# Patient Record
Sex: Female | Born: 1945 | Race: White | Hispanic: No | Smoking: Never smoker
Health system: Southern US, Community
[De-identification: ages and names within clinical notes are randomized; demographics above are authoritative.]

## PROBLEM LIST (undated history)

## (undated) DIAGNOSIS — R0600 Dyspnea, unspecified: Secondary | ICD-10-CM

## (undated) DIAGNOSIS — K922 Gastrointestinal hemorrhage, unspecified: Secondary | ICD-10-CM

## (undated) DIAGNOSIS — A499 Bacterial infection, unspecified: Secondary | ICD-10-CM

## (undated) DIAGNOSIS — M1990 Unspecified osteoarthritis, unspecified site: Secondary | ICD-10-CM

## (undated) DIAGNOSIS — N390 Urinary tract infection, site not specified: Secondary | ICD-10-CM

## (undated) DIAGNOSIS — C801 Malignant (primary) neoplasm, unspecified: Secondary | ICD-10-CM

## (undated) DIAGNOSIS — F419 Anxiety disorder, unspecified: Secondary | ICD-10-CM

## (undated) DIAGNOSIS — G4730 Sleep apnea, unspecified: Secondary | ICD-10-CM

## (undated) DIAGNOSIS — K649 Unspecified hemorrhoids: Secondary | ICD-10-CM

## (undated) DIAGNOSIS — D649 Anemia, unspecified: Secondary | ICD-10-CM

## (undated) DIAGNOSIS — K219 Gastro-esophageal reflux disease without esophagitis: Secondary | ICD-10-CM

## (undated) DIAGNOSIS — J189 Pneumonia, unspecified organism: Secondary | ICD-10-CM

## (undated) DIAGNOSIS — F329 Major depressive disorder, single episode, unspecified: Secondary | ICD-10-CM

## (undated) DIAGNOSIS — K5790 Diverticulosis of intestine, part unspecified, without perforation or abscess without bleeding: Secondary | ICD-10-CM

## (undated) DIAGNOSIS — I10 Essential (primary) hypertension: Secondary | ICD-10-CM

## (undated) DIAGNOSIS — E669 Obesity, unspecified: Secondary | ICD-10-CM

## (undated) DIAGNOSIS — K449 Diaphragmatic hernia without obstruction or gangrene: Secondary | ICD-10-CM

## (undated) DIAGNOSIS — E785 Hyperlipidemia, unspecified: Secondary | ICD-10-CM

## (undated) DIAGNOSIS — F32A Depression, unspecified: Secondary | ICD-10-CM

## (undated) HISTORY — DX: Unspecified hemorrhoids: K64.9

## (undated) HISTORY — PX: HIATAL HERNIA REPAIR: SHX195

## (undated) HISTORY — DX: Urinary tract infection, site not specified: N39.0

## (undated) HISTORY — DX: Unspecified osteoarthritis, unspecified site: M19.90

## (undated) HISTORY — PX: ABDOMINAL HYSTERECTOMY: SHX81

## (undated) HISTORY — DX: Pneumonia, unspecified organism: J18.9

## (undated) HISTORY — DX: Obesity, unspecified: E66.9

## (undated) HISTORY — DX: Anxiety disorder, unspecified: F41.9

## (undated) HISTORY — DX: Depression, unspecified: F32.A

## (undated) HISTORY — DX: Urinary tract infection, site not specified: A49.9

## (undated) HISTORY — DX: Diverticulosis of intestine, part unspecified, without perforation or abscess without bleeding: K57.90

## (undated) HISTORY — DX: Anemia, unspecified: D64.9

## (undated) HISTORY — DX: Hyperlipidemia, unspecified: E78.5

## (undated) HISTORY — PX: APPENDECTOMY: SHX54

## (undated) HISTORY — DX: Major depressive disorder, single episode, unspecified: F32.9

## (undated) HISTORY — DX: Diaphragmatic hernia without obstruction or gangrene: K44.9

## (undated) HISTORY — DX: Gastrointestinal hemorrhage, unspecified: K92.2

## (undated) HISTORY — DX: Gastro-esophageal reflux disease without esophagitis: K21.9

---

## 1999-04-09 ENCOUNTER — Other Ambulatory Visit: Admission: RE | Admit: 1999-04-09 | Discharge: 1999-04-09 | Payer: Self-pay | Admitting: Obstetrics and Gynecology

## 2001-04-12 ENCOUNTER — Encounter: Admission: RE | Admit: 2001-04-12 | Discharge: 2001-04-12 | Payer: Self-pay | Admitting: Gastroenterology

## 2001-04-12 ENCOUNTER — Encounter: Payer: Self-pay | Admitting: Gastroenterology

## 2004-04-04 ENCOUNTER — Ambulatory Visit (HOSPITAL_COMMUNITY): Admission: RE | Admit: 2004-04-04 | Discharge: 2004-04-04 | Payer: Self-pay | Admitting: Gastroenterology

## 2004-05-16 ENCOUNTER — Ambulatory Visit (HOSPITAL_COMMUNITY): Admission: RE | Admit: 2004-05-16 | Discharge: 2004-05-16 | Payer: Self-pay | Admitting: Gastroenterology

## 2004-05-16 ENCOUNTER — Encounter (INDEPENDENT_AMBULATORY_CARE_PROVIDER_SITE_OTHER): Payer: Self-pay | Admitting: Specialist

## 2006-09-01 ENCOUNTER — Ambulatory Visit: Payer: Self-pay | Admitting: Internal Medicine

## 2008-03-08 ENCOUNTER — Telehealth: Payer: Self-pay | Admitting: Internal Medicine

## 2008-03-08 ENCOUNTER — Ambulatory Visit: Payer: Self-pay | Admitting: Family Medicine

## 2008-03-08 DIAGNOSIS — K219 Gastro-esophageal reflux disease without esophagitis: Secondary | ICD-10-CM | POA: Insufficient documentation

## 2008-03-08 DIAGNOSIS — F331 Major depressive disorder, recurrent, moderate: Secondary | ICD-10-CM | POA: Insufficient documentation

## 2008-03-08 DIAGNOSIS — R079 Chest pain, unspecified: Secondary | ICD-10-CM | POA: Insufficient documentation

## 2008-03-08 DIAGNOSIS — K449 Diaphragmatic hernia without obstruction or gangrene: Secondary | ICD-10-CM

## 2008-03-09 ENCOUNTER — Inpatient Hospital Stay (HOSPITAL_COMMUNITY): Admission: EM | Admit: 2008-03-09 | Discharge: 2008-03-12 | Payer: Self-pay | Admitting: Emergency Medicine

## 2008-03-09 ENCOUNTER — Ambulatory Visit: Payer: Self-pay | Admitting: Internal Medicine

## 2008-03-09 ENCOUNTER — Telehealth: Payer: Self-pay

## 2008-03-11 ENCOUNTER — Encounter: Payer: Self-pay | Admitting: Internal Medicine

## 2008-03-13 ENCOUNTER — Encounter: Payer: Self-pay | Admitting: Internal Medicine

## 2008-03-13 LAB — HM COLONOSCOPY

## 2008-03-14 ENCOUNTER — Ambulatory Visit: Payer: Self-pay | Admitting: Internal Medicine

## 2008-03-16 ENCOUNTER — Ambulatory Visit: Payer: Self-pay | Admitting: Internal Medicine

## 2008-03-16 DIAGNOSIS — D649 Anemia, unspecified: Secondary | ICD-10-CM | POA: Insufficient documentation

## 2008-03-17 LAB — CONVERTED CEMR LAB
Basophils Absolute: 0.1 10*3/uL (ref 0.0–0.1)
Basophils Relative: 1.9 % (ref 0.0–3.0)
Eosinophils Relative: 0.5 % (ref 0.0–5.0)
Hemoglobin: 12.2 g/dL (ref 12.0–15.0)
Lymphocytes Relative: 36.8 % (ref 12.0–46.0)
MCHC: 32.1 g/dL (ref 30.0–36.0)
Monocytes Relative: 7.2 % (ref 3.0–12.0)
Neutro Abs: 2.8 10*3/uL (ref 1.4–7.7)
Neutrophils Relative %: 53.6 % (ref 43.0–77.0)
RBC: 4.96 M/uL (ref 3.87–5.11)
WBC: 5.3 10*3/uL (ref 4.5–10.5)

## 2008-04-14 ENCOUNTER — Ambulatory Visit: Payer: Self-pay | Admitting: Internal Medicine

## 2008-04-17 LAB — CONVERTED CEMR LAB
Eosinophils Absolute: 0.1 10*3/uL (ref 0.0–0.7)
HCT: 41.6 % (ref 36.0–46.0)
Hemoglobin: 13.8 g/dL (ref 12.0–15.0)
MCHC: 33.2 g/dL (ref 30.0–36.0)
MCV: 81.3 fL (ref 78.0–100.0)
Monocytes Absolute: 0.3 10*3/uL (ref 0.1–1.0)
Monocytes Relative: 5.4 % (ref 3.0–12.0)
Neutro Abs: 2.7 10*3/uL (ref 1.4–7.7)
Platelets: 162 10*3/uL (ref 150–400)
RDW: 29.3 % — ABNORMAL HIGH (ref 11.5–14.6)

## 2008-06-15 ENCOUNTER — Telehealth: Payer: Self-pay | Admitting: Internal Medicine

## 2008-06-19 ENCOUNTER — Ambulatory Visit: Payer: Self-pay | Admitting: Internal Medicine

## 2008-06-20 LAB — CONVERTED CEMR LAB
Basophils Absolute: 0 10*3/uL (ref 0.0–0.1)
Eosinophils Absolute: 0.1 10*3/uL (ref 0.0–0.7)
Eosinophils Relative: 1.4 % (ref 0.0–5.0)
Lymphocytes Relative: 43.3 % (ref 12.0–46.0)
MCV: 89.6 fL (ref 78.0–100.0)
Neutrophils Relative %: 50.7 % (ref 43.0–77.0)
Platelets: 175 10*3/uL (ref 150–400)
RBC: 4.52 M/uL (ref 3.87–5.11)
WBC: 6 10*3/uL (ref 4.5–10.5)

## 2008-12-26 ENCOUNTER — Encounter: Payer: Self-pay | Admitting: Internal Medicine

## 2009-08-20 IMAGING — CR DG CHEST 2V
2 series · 2 of 2 positions shown · non-contrast
Comparison: None

CLINICAL DATA: Chest pain, short of breath.

CHEST - 2 VIEW

[w chest pa]
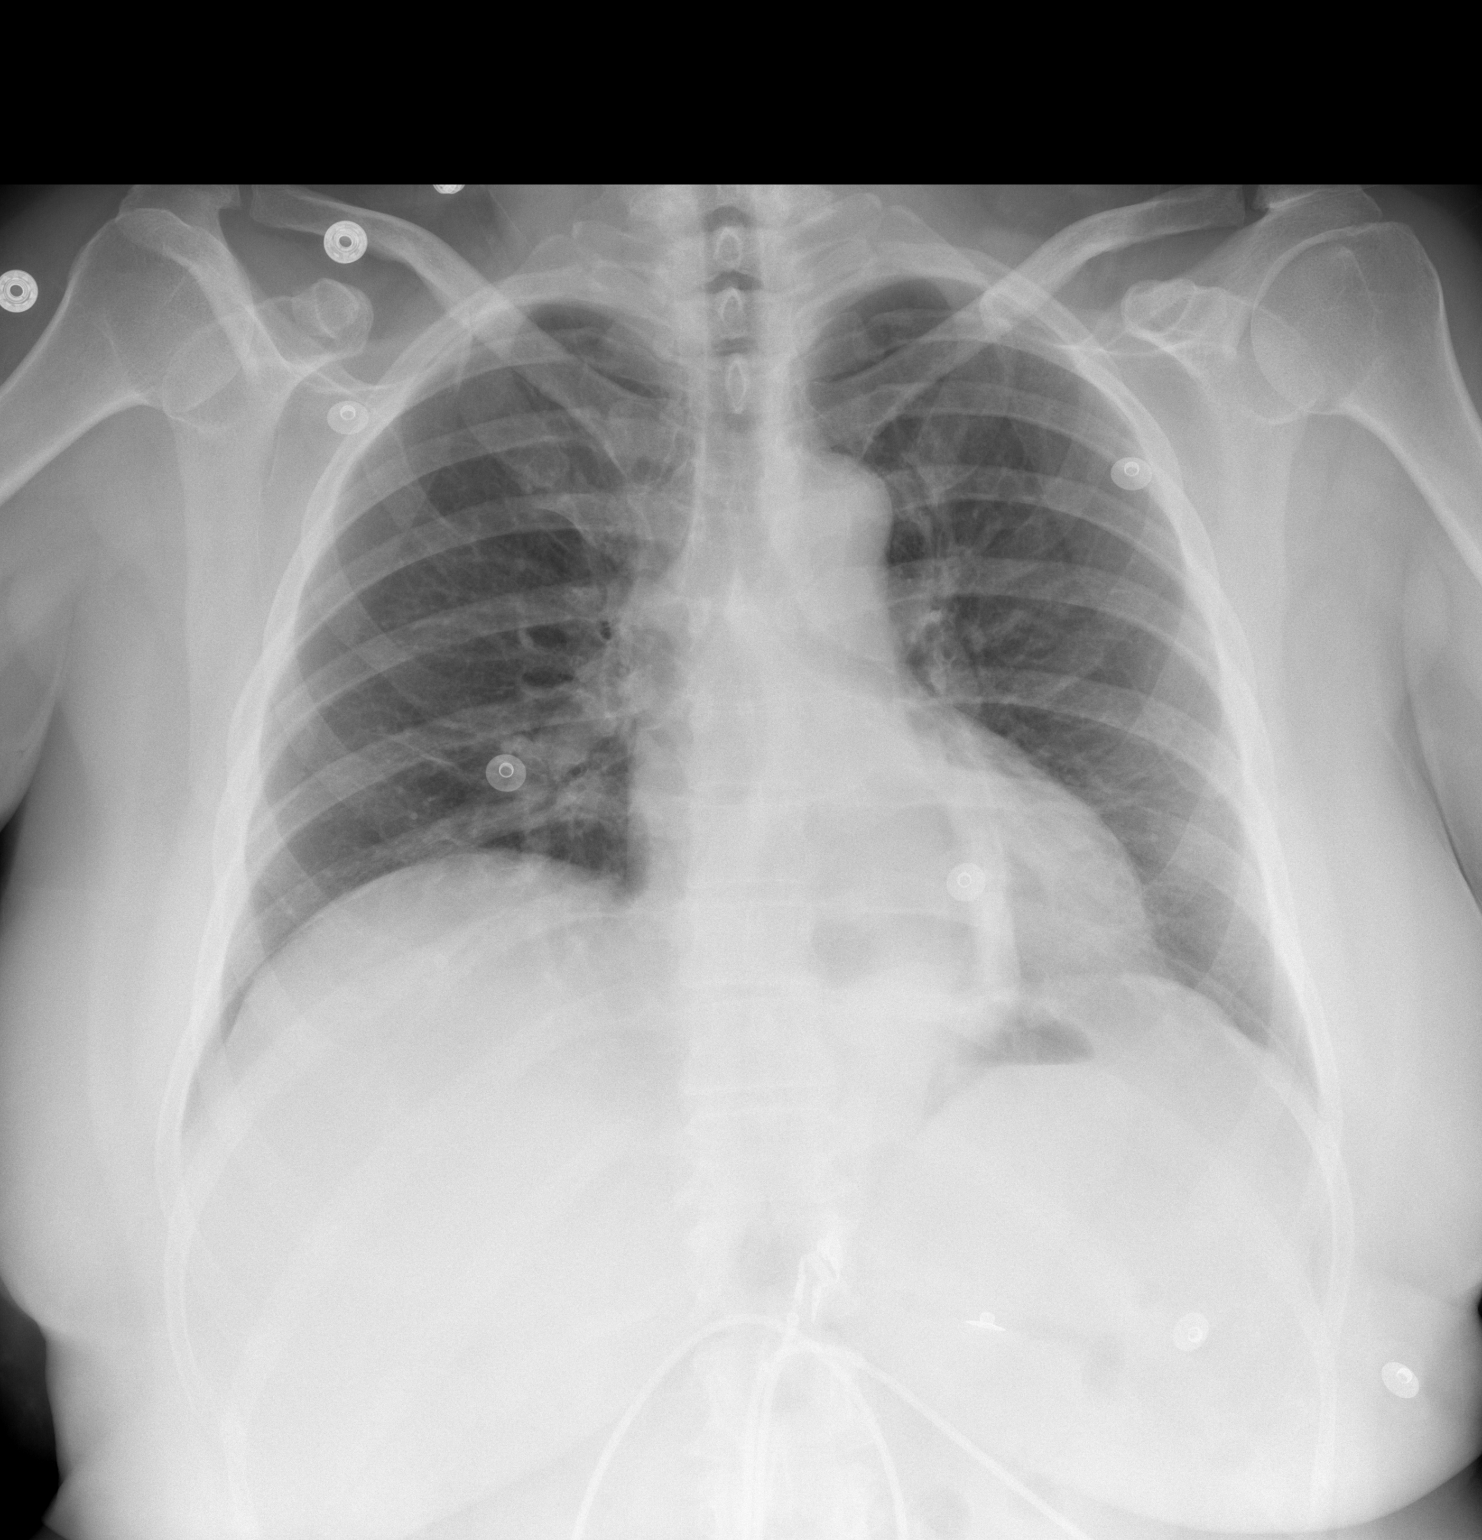

[w chest lat]
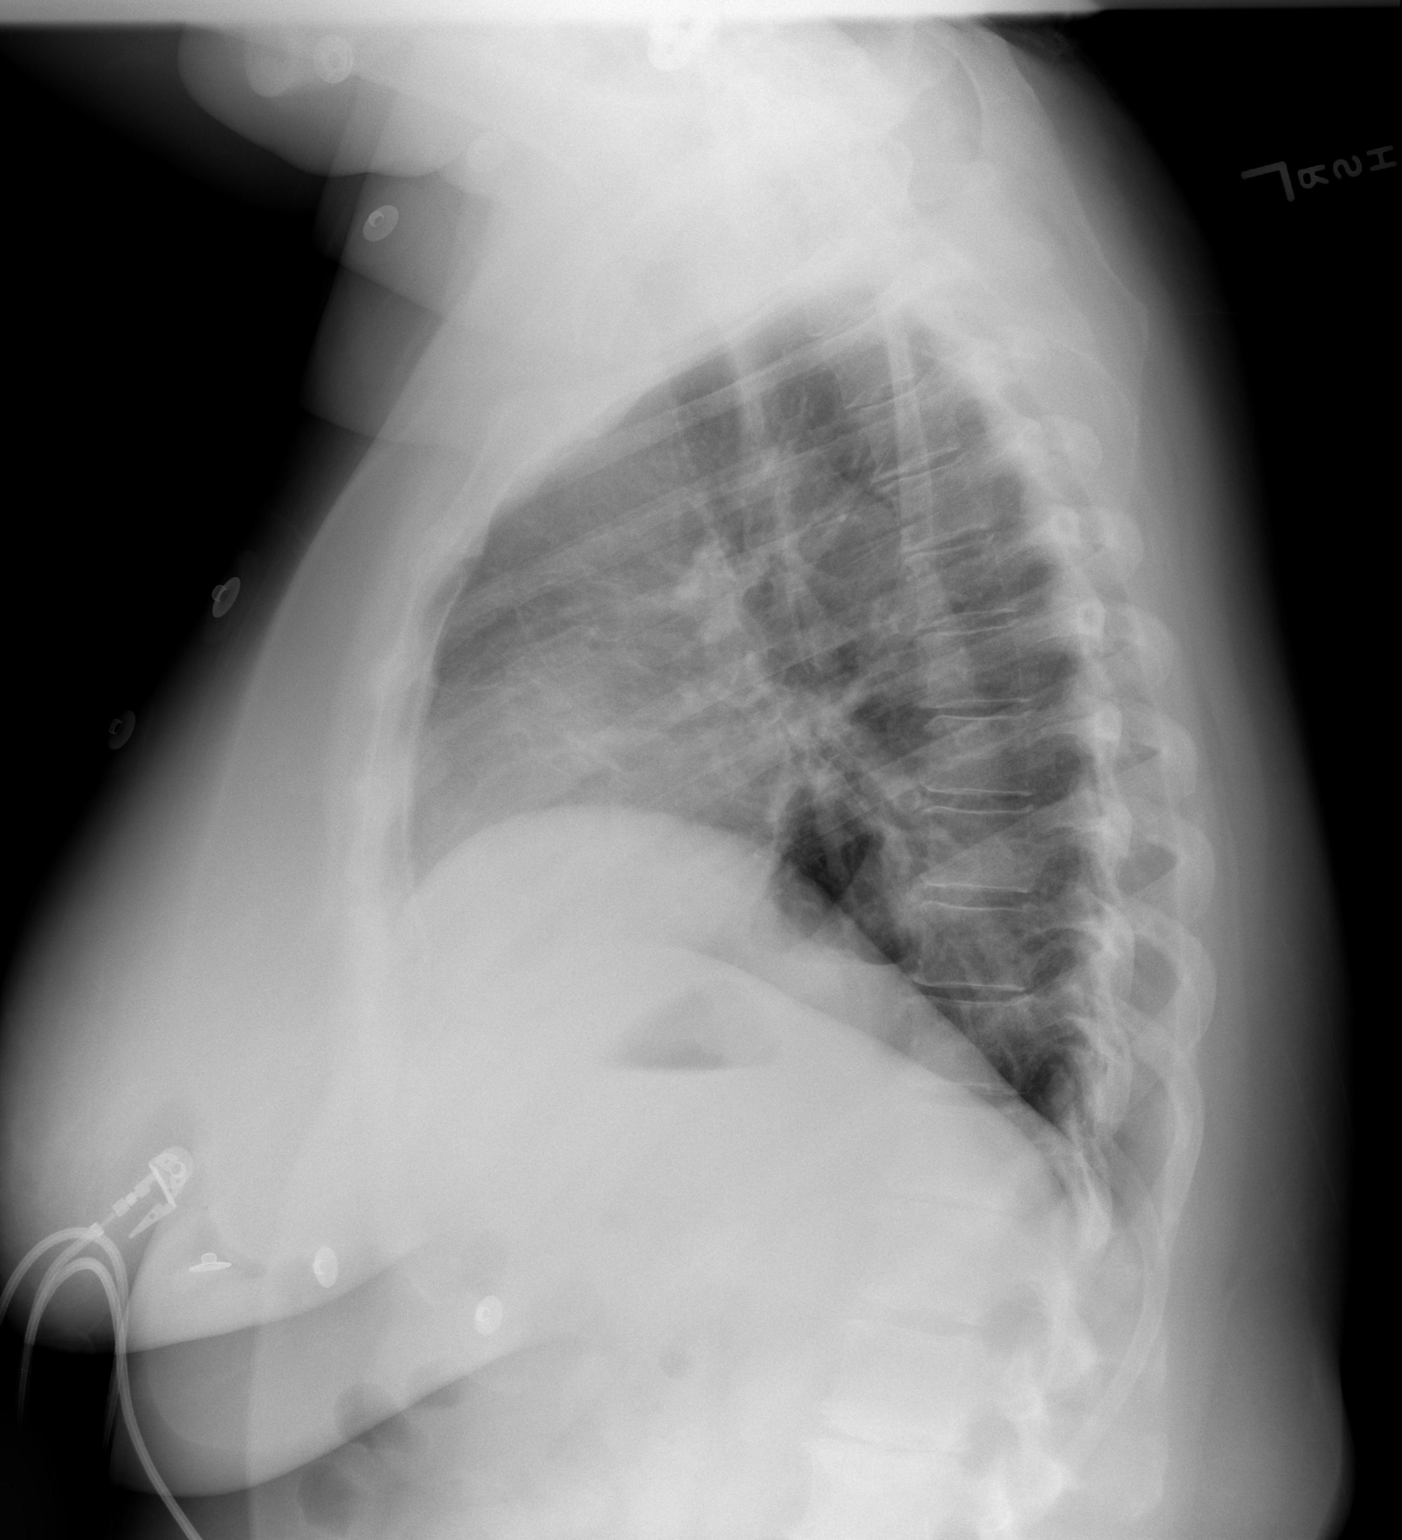

[2 of 2 positions shown; findings below may reference images not displayed]

FINDINGS: The heart size is normal and the vascularity is normal.
There is no edema or infiltrate and there is no effusion.  There is
a moderately large hiatal hernia.  There is bibasilar atelectasis.
IMPRESSION: Bibasilar atelectasis.  No acute infiltrate

Hiatal hernia

## 2010-01-07 ENCOUNTER — Telehealth: Payer: Self-pay | Admitting: Internal Medicine

## 2010-01-09 ENCOUNTER — Ambulatory Visit: Payer: Self-pay | Admitting: Internal Medicine

## 2010-01-09 LAB — CONVERTED CEMR LAB
ALT: 29 units/L (ref 0–35)
AST: 22 units/L (ref 0–37)
Alkaline Phosphatase: 60 units/L (ref 39–117)
Basophils Relative: 1 % (ref 0.0–3.0)
Bilirubin Urine: NEGATIVE
Bilirubin, Direct: 0.1 mg/dL (ref 0.0–0.3)
Blood in Urine, dipstick: NEGATIVE
CO2: 31 meq/L (ref 19–32)
Calcium: 9.5 mg/dL (ref 8.4–10.5)
Creatinine, Ser: 0.8 mg/dL (ref 0.4–1.2)
Eosinophils Relative: 1.3 % (ref 0.0–5.0)
HDL: 64.9 mg/dL (ref >39.00)
Hemoglobin: 12 g/dL (ref 12.0–15.0)
Ketones, urine, test strip: NEGATIVE
LDL Cholesterol: 111 mg/dL — ABNORMAL HIGH (ref 0–99)
Lymphocytes Relative: 40.9 % (ref 12.0–46.0)
Monocytes Relative: 5.4 % (ref 3.0–12.0)
Neutrophils Relative %: 51.4 % (ref 43.0–77.0)
RBC: 4.71 M/uL (ref 3.87–5.11)
Specific Gravity, Urine: 1.025
Total Bilirubin: 0.4 mg/dL (ref 0.3–1.2)
Total CHOL/HDL Ratio: 3
Transferrin: 314.4 mg/dL (ref 212.0–360.0)
Triglycerides: 99 mg/dL (ref 0.0–149.0)
VLDL: 19.8 mg/dL (ref 0.0–40.0)
WBC: 5.3 10*3/uL (ref 4.5–10.5)

## 2010-01-10 ENCOUNTER — Encounter: Payer: Self-pay | Admitting: Internal Medicine

## 2010-01-10 ENCOUNTER — Telehealth: Payer: Self-pay | Admitting: Internal Medicine

## 2010-01-18 ENCOUNTER — Ambulatory Visit: Payer: Self-pay | Admitting: Internal Medicine

## 2010-09-08 ENCOUNTER — Encounter: Payer: Self-pay | Admitting: Gastroenterology

## 2010-09-19 NOTE — Progress Notes (Signed)
Summary: Pt req to have complete blood panel ordered  Phone Note Call from Patient Call back at Home Phone [phone]   Caller: Patient Summary of Call: Pt called and said that she had anemia and Dr Cato Mulligan had mentioned about getting lab work done. Pt wanted to have complete blood panel ordered. Please advise.  Initial call taken by: Lucy Antigua,  Jan 07, 2010 10:42 AM  Follow-up for Phone Call        Pt called again today to ck on the status of her phone call yesterday.... Pt adv that she can be reached at [phone].  Follow-up by: Debbra Riding,  Jan 08, 2010 10:03 AM  Additional Follow-up for Phone Call Additional follow up Details #1::        CBC, ferritin, IBC panel see me after labs Additional Follow-up by: Birdie Sons MD,  Jan 08, 2010 10:50 AM    Additional Follow-up for Phone Call Additional follow up Details #2::    I called pt and sch her for CBC, ferritin, IBC panel, and follow up with Dr Cato Mulligan, as noted above.   Follow-up by: Lucy Antigua,  Jan 08, 2010 11:28 AM

## 2010-09-19 NOTE — Assessment & Plan Note (Signed)
Summary: follow up from lab work/cjr   Vital Signs:  Patient profile:   65 year old female Height:      62 inches Weight:      213 pounds BMI:     39.10 Temp:     98.7 degrees F oral Pulse rate:   88 / minute Pulse rhythm:   regular BP sitting:   152 / 98  (left arm) Cuff size:   large  Vitals Entered By: Mervin Hack CMA Duncan Dull) (January 18, 2010 10:40 AM)  Serial Vital Signs/Assessments:  Time      Position  BP       Pulse  Resp  Temp     By                     135/70                         Birdie Sons MD  CC: follow-up visit   CC:  follow-up visit.  History of Present Illness: she feels well hx anemia---Cameron ERsions she reports four episodes over the past several months. She reports episodes of profound weakness, sweaty, lightheaded. Sxs pass after she takes "a nap" or falls asleep. She does admit to feeling "weak" for a few days after.   Allergies: No Known Drug Allergies  Past History:  Past Medical History: Last updated: 03/16/2008 Anemia-NOS---GI (endo/colon) Endoscopy---Cameron erosions  Past Surgical History: Last updated: 03/16/2008 Hysterectomy Appendectomy--as kid (? intussuseption)  Family History: Last updated: 04/16/2007 Family History of Arthritis Family History Uterine cancer Family History Osteoporosis Family History Diabetes 1st degree relative Family History Hypertension  Social History: Last updated: 04/16/2007 Married Never Smoked Alcohol use-yes Drug use-no Regular exercise-no  Risk Factors: Exercise: no (04/16/2007)  Risk Factors: Smoking Status: never (04/16/2007)  Physical Exam  General:  alert and well-developed.   Head:  normocephalic and atraumatic.   Eyes:  pupils equal and pupils round.   Ears:  R ear normal and L ear normal.   Neck:  No deformities, masses, or tenderness noted. Chest Wall:  No deformities, masses, or tenderness noted. Lungs:  Normal respiratory effort, chest expands symmetrically. Lungs  are clear to auscultation, no crackles or wheezes. Heart:  Normal rate and regular rhythm. S1 and S2 normal without gallop, murmur, click, rub or other extra sounds. Abdomen:  Bowel sounds positive,abdomen soft and non-tender without masses, organomegaly or hernias noted. Msk:  No deformity or scoliosis noted of thoracic or lumbar spine.   Pulses:  R radial normal and L radial normal.   Neurologic:  cranial nerves II-XII intact and gait normal.     Impression & Recommendations:  Problem # 1:  ANEMIA-NOS (ICD-285.9) reviewed labs note ferritin add iron side effects discussed Her updated medication list for this problem includes:    Iron 325 (65 Fe) Mg Tabs (Ferrous sulfate) .Marland Kitchen... 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 Once daily  Complete Medication List: 1)  Iron 325 (65 Fe) Mg  Tabs (Ferrous sulfate) .... Take one tab two times a day 2)  Vitamin C Cr 500 Mg Cr-tabs (Ascorbic acid) .... Take one tab once daily 3)  Prilosec 10 Mg Cpdr (Omeprazole) .... Once daily 4)  Tylenol Extra Strength 500 Mg Tabs (Acetaminophen) .... As needed for pain  Current Allergies (reviewed today): No known allergies

## 2010-09-19 NOTE — Progress Notes (Signed)
Summary: requesting labs  Phone Note Call from Patient Call back at Home Phone [phone] Call back at [phone]   Caller: Patient--live call Reason for Call: Talk to Nurse Summary of Call: requesting lab results. please return call. Initial call taken by: Warnell Forester,  Jan 10, 2010 11:05 AM  Follow-up for Phone Call        Called pt and told her ferritin and iron saturation are low and he will discuss at appt next week per dr swords. Follow-up by: Gladis Riffle, RN,  Jan 10, 2010 11:53 AM

## 2010-12-13 ENCOUNTER — Inpatient Hospital Stay (HOSPITAL_COMMUNITY)
Admission: EM | Admit: 2010-12-13 | Discharge: 2010-12-14 | DRG: 378 | Disposition: A | Discharge status: Home / Self Care | Payer: No Typology Code available for payment source | Source: Ambulatory Visit | Attending: Internal Medicine | Admitting: Internal Medicine

## 2010-12-13 DIAGNOSIS — IMO0002 Reserved for concepts with insufficient information to code with codable children: Secondary | ICD-10-CM | POA: Diagnosis present

## 2010-12-13 DIAGNOSIS — K921 Melena: Secondary | ICD-10-CM | POA: Diagnosis present

## 2010-12-13 DIAGNOSIS — K449 Diaphragmatic hernia without obstruction or gangrene: Secondary | ICD-10-CM | POA: Diagnosis present

## 2010-12-13 DIAGNOSIS — D62 Acute posthemorrhagic anemia: Secondary | ICD-10-CM | POA: Diagnosis present

## 2010-12-13 DIAGNOSIS — K7689 Other specified diseases of liver: Secondary | ICD-10-CM | POA: Diagnosis present

## 2010-12-13 DIAGNOSIS — K259 Gastric ulcer, unspecified as acute or chronic, without hemorrhage or perforation: Secondary | ICD-10-CM | POA: Diagnosis present

## 2010-12-13 DIAGNOSIS — K922 Gastrointestinal hemorrhage, unspecified: Principal | ICD-10-CM | POA: Diagnosis present

## 2010-12-13 DIAGNOSIS — E669 Obesity, unspecified: Secondary | ICD-10-CM | POA: Diagnosis present

## 2010-12-13 DIAGNOSIS — K219 Gastro-esophageal reflux disease without esophagitis: Secondary | ICD-10-CM | POA: Diagnosis present

## 2010-12-14 LAB — COMPREHENSIVE METABOLIC PANEL
ALT: 25 U/L (ref 0–35)
AST: 18 U/L (ref 0–37)
Alkaline Phosphatase: 32 U/L — ABNORMAL LOW (ref 39–117)
CO2: 25 mEq/L (ref 19–32)
Calcium: 8.4 mg/dL (ref 8.4–10.5)
Chloride: 105 mEq/L (ref 96–112)
GFR calc Af Amer: >60 mL/min (ref >60)
GFR calc non Af Amer: >60 mL/min (ref >60)
Glucose, Bld: 131 mg/dL — ABNORMAL HIGH (ref 70–99)
Potassium: 3.8 mEq/L (ref 3.5–5.1)
Sodium: 139 mEq/L (ref 135–145)
Total Bilirubin: 0.2 mg/dL — ABNORMAL LOW (ref 0.3–1.2)

## 2010-12-14 LAB — GLUCOSE, CAPILLARY

## 2010-12-14 LAB — CBC
HCT: 25.6 % — ABNORMAL LOW (ref 36.0–46.0)
HCT: 25.4 % — ABNORMAL LOW (ref 36.0–46.0)
Hemoglobin: 8.2 g/dL — ABNORMAL LOW (ref 12.0–15.0)
MCH: 27.4 pg (ref 26.0–34.0)
MCH: 28.2 pg (ref 26.0–34.0)
MCHC: 31.8 g/dL (ref 30.0–36.0)
MCV: 86.4 fL (ref 78.0–100.0)
Platelets: 235 10*3/uL (ref 150–400)
RBC: 2.99 MIL/uL — ABNORMAL LOW (ref 3.87–5.11)
RDW: 15.2 % (ref 11.5–15.5)
RDW: 15.5 % (ref 11.5–15.5)
WBC: 7.3 10*3/uL (ref 4.0–10.5)

## 2010-12-14 LAB — PROTIME-INR
INR: 0.99 (ref 0.00–1.49)
Prothrombin Time: 13.3 seconds (ref 11.6–15.2)

## 2010-12-15 LAB — CLOTEST (H. PYLORI), BIOPSY: Helicobacter screen: NEGATIVE

## 2010-12-16 ENCOUNTER — Other Ambulatory Visit (INDEPENDENT_AMBULATORY_CARE_PROVIDER_SITE_OTHER): Payer: PRIVATE HEALTH INSURANCE | Admitting: Internal Medicine

## 2010-12-16 ENCOUNTER — Telehealth: Payer: Self-pay | Admitting: Internal Medicine

## 2010-12-16 DIAGNOSIS — D649 Anemia, unspecified: Secondary | ICD-10-CM

## 2010-12-16 LAB — CROSSMATCH
ABO/RH(D): A POS
Antibody Screen: NEGATIVE
Unit division: 0
Unit division: 0
Unit division: 0

## 2010-12-16 NOTE — Telephone Encounter (Signed)
Fingerstick HGB only. No need for OV

## 2010-12-16 NOTE — Telephone Encounter (Signed)
Pt was discharged from hospital this weekend. Pt is requesting to come in for cbc only and not hos fup. Can I sch lab only?

## 2010-12-16 NOTE — Progress Notes (Signed)
The first reading was 8.1  I rechecked because she said it was higher when she left the hospital. The QC on the hemoCue was done this morning and was within range.

## 2010-12-18 NOTE — Telephone Encounter (Signed)
Pt had lab done on 12-16-10

## 2010-12-20 ENCOUNTER — Telehealth: Payer: Self-pay | Admitting: *Deleted

## 2010-12-20 NOTE — Telephone Encounter (Signed)
Notified pt.   Pt is on Prilosec, not Dexilant.

## 2010-12-20 NOTE — Telephone Encounter (Signed)
Pt was in hospital last week with anemia, and had transfusions.  Pt is now complaining of swollen joints, muscles are sore and itching all over.  Pt had bleeding from ulcer in stomach. Taking Prilosec and iron and multivitamins.  Feet are extremely swollen.

## 2010-12-20 NOTE — Telephone Encounter (Signed)
Odd. Any other meds? i think only on dexilant.  This weekend: take tylenol 650mg  po tid. Keep feet elevated. She should call for any worsening of SOB

## 2010-12-22 NOTE — Discharge Summary (Signed)
Shelly Johnson, BECRAFT                 ACCOUNT NO.:  [id number]  MEDICAL RECORD NO.:  [id number]           PATIENT TYPE:  I  LOCATION:  2918                         FACILITY:  [facility]  PHYSICIAN:  Erick Blinks, MD     DATE OF BIRTH:  Jul 19, 1946  DATE OF ADMISSION:  12/13/2010 DATE OF DISCHARGE:  12/14/2010                              DISCHARGE SUMMARY   PRIMARY CARE PHYSICIAN:  [name], MD  GASTROENTEROLOGIST:  [name]. [name], MD  DISCHARGE DIAGNOSES: 1. Upper gastrointestinal bleeding. 2. 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 3. Gastric ulcer with a few erosions. 4. Severe symptomatic anemia. 5. Acute blood loss anemia.  DISCHARGE MEDICATIONS: 1. Iron over the counter 1 tablet p.o. t.i.d. 2. Prilosec 20 mg p.o. b.i.d.  ADMISSION HISTORY:  This is a 65 year old female with past medical history of GERD and iron deficiency anemia secondary to chronic blood loss from [facility] lesions.  The patient was having nausea, vomiting, diarrhea on and off for 1 week prior to admission.  She has also noticed black colored stools.  She went to her primary care doctor where her hemoglobin was found to be 5.5 and she was subsequently admitted to the hospital for further evaluation.  For further details, please refer to history and physical dictated by Dr. Toniann Fail on December 14, 2010.  HOSPITAL COURSE:  Upper GI bleeding.  The patient was seen in consultation by Dr. Loreta Ave from Gastroenterology.  She underwent an upper endoscopy which showed again [facility] lesions and an antral ulcer with scattered erosions.  There was no report of any active bleeding and no acute intervention was needed.  The patient received a total of 2 units of PRBCs while here in the hospital.  Hemoglobin is currently 8.3.  The patient is adamantly requesting to be discharged home today.  She is symptomatically feeling better after a blood transfusion.  It was recommended that the patient stay overnight to ensure  that her hemoglobin is stable, but she adamantly wants to be discharged today. She will follow up with her primary care physician on Monday for a CBC. She has been cleared for discharge from the Gastroenterology Service. We will place her on t.i.d. iron supplements as well as b.i.d. proton pump inhibitors.  It was also recommended that the patient talk to her primary care physician about possibly starting Dexilant as a new proton pump inhibitor.  The patient was advised to avoid any nonsteroidal anti- inflammatory drugs.  CONSULTATIONS:  Anselmo Rod, MD, [facility], Gastroenterology.  PROCEDURES:  Upper endoscopy on December 11, 2010, with results as above.  DIAGNOSTIC IMAGING:  None.  DISCHARGE INSTRUCTIONS:  The patient should continue on a heart-healthy diet, conduct her activity as tolerated.  She will need to follow up with Dr. Cato Mulligan on Monday for a repeat CBC and she will see Dr. [name] in the office in 2 weeks' time.  She has been told to avoid any NSAIDs as well as continue on a proton pump inhibitor.  She will also be taking iron 3 times a day.  She was advised to return to the hospital if  she has any further lightheadedness, chest pain, shortness of breath, generalized weakness, or persistence of her dark-colored stools or any fresh blood in her stools.  Plan was discussed with the patient who is in agreement.  Condition at the time of discharge is improved.     Erick Blinks, MD     JM/MEDQ  D:  12/14/2010  T:  12/15/2010  Job:  [id number]  cc:   [name], MD [name]. [name], MD  Electronically Signed by Erick Blinks  on 12/22/2010 12:36:09 AM

## 2010-12-29 NOTE — H&P (Signed)
NAMEPAMELYN, BANCROFT                 ACCOUNT NO.:  [id number]  MEDICAL RECORD NO.:  [id number]           PATIENT TYPE:  I  LOCATION:  5502                         FACILITY:  [facility]  PHYSICIAN:  Eduard Clos, MDDATE OF BIRTH:  1945-08-30  DATE OF ADMISSION:  12/13/2010 DATE OF DISCHARGE:                             HISTORY & PHYSICAL   PRIMARY CARE PHYSICIAN:  [name], MD  CHIEF COMPLAINT:  Weakness and having nausea, vomiting, and black stool.  HISTORY OF PRESENTING ILLNESS:  A 65 year old female with history of iron-deficient anemia and was admitted in July 2009 with heme-positive stools and severe iron deficiency anemia.  At this time, EGD showed some [name] lesion and at the same day also had a colonoscopy.  I was not able to find exact cause for her severe anemia, had transfused PRBC at that time, is being feeling weak over the last 1 week.  The patient has been having nausea, vomiting, and diarrhea off and on for last 1 week and last 2 days noticed black stools.  The patient was so weak that she was not able to move and there was on the floor.  She did not lose consciousness.  At this time, Dr. Cato Mulligan had done CBC, which showed a hemoglobin of 5.5 and the patient has been admitted for her severe anemia, which is symptomatic.  The patient states she has had a recent root canal treatment last month wherein she had some pain.  Initially, she was given some pain medication, which did not go well with her, so she started taking ibuprofen.  She did take ibuprofen for some time and she did also take ibuprofen for last 2 days because she had some pain in the legs and low back.  Presently the patient has no pain, is feeling very weak.  Denies any chest pain, shortness of breath.  At this time, does not have any nausea or vomiting, does not have any abdominal pain or diarrhea. Denies any headache, any loss of consciousness, or any visual symptoms or focal  deficit.  PAST MEDICAL HISTORY: 1. History of severe iron deficiency anemia with heme-positive stools     and has required PRBC transfusion in July 1009.  At the time, EEG     and showed some Cameron lesions. 2. History of GERD.  PAST SURGICAL HISTORY:  Appendectomy, hysterectomy.  MEDICATIONS PRIOR TO ADMISSION:  The patient takes omeprazole, which she stopped taking a week ago.  The patient takes some multivitamin, dose of the medication has to be verified and most importantly the patient was taking some ibuprofen for the last 3 days.  ALLERGIES:  No known drug allergies.  FAMILY HISTORY:  Positive for diabetes and hypertension.  SOCIAL HISTORY:  The patient does not smoke cigarette, drinking alcohol or use illegal drugs.  REVIEW OF SYSTEMS:  As per the history of presenting illness, nothing else significant.  PHYSICAL EXAMINATION:  GENERAL:  The patient examined at the bedside, not in acute distress. VITAL SIGNS:  Blood pressure is 96/60, pulse around 110 per minute, temperature 97.7, respiration is 18 per minute, O2 sat  95%. HEENT:  Anicteric.  Mild pallor.  No facial asymmetry.  Tongue is midline. NECK:  No neck rigidity. CHEST:  Bilateral air entry present.  No rhonchi.  No crepitation. HEART:  S1-S2 heard. ABDOMEN:  Soft, nontender.  Bowel sounds heard. CNS:  The patient is alert, awake, and oriented to time, place and person.  Moves upper and lower extremities 5/5. EXTREMITIES:  Peripheral pulses felt.  No edema.  LABORATORY DATA:  CBC; WBC is 9.4, hemoglobin 6.3, hematocrit is 19.8, platelets 235.  PT/INR 13.3 and 0.9.  Complete metabolic panel; sodium 139, potassium 3.8, chloride 105, carbon dioxide 25, glucose 131, BUN 23, creatinine 0.8, total bili is 0.2, alkaline phosphatase is 32, AST 18, ALT 25, total protein 5.7, albumin 3.2, calcium 8.4.  ASSESSMENT: 1. Severe symptomatic anemia. 2. Anemia probably from acute blood loss possibly upper      gastrointestinal bleed. 3. Nausea, vomiting and diarrhea probably from recent antibiotic use,     rule out Clostridium difficile. 4. Previous history of severe symptomatic anemia.  At this time, EGD     only showed Cameron lesions. 5. Recent use of nonsteroidal anti-inflammatory drugs.  PLAN: 1. At this time, I will admit the patient to step-down unit for at     least next 12-24 hours. 2. For severe symptomatic anemia, which at this time, could be from     upper GI bleed with the recent use of NSAIDs.  I am going to     transfuse 2 units of PRBCs, we will hydrate the patient with normal     saline.  I will keep the patient n.p.o.  I am going to start     Protonix drip.  We will get a GI consult. 3. Nausea, vomiting, and diarrhea.  At this time, the patient did use     some antibiotics at least for 2 weeks, once for some bronchitis-     like features and once for her root canal treatment last month, so     I am going to check a stool for C. diff PCR and at this time as     suggested earlier, I am going to hydrate the patient. 4. We closely follow the CBC.  We will also check for stool for occult     blood. 5. Further recommendation as condition evolves and per test orders and     per GI consults recommendations.     Eduard Clos, MD     ANK/MEDQ  D:  12/14/2010  T:  12/14/2010  Job:  [id number]  cc:   [name], MD  Electronically Signed by Midge Minium MD on 12/29/2010 08:26:37 AM

## 2010-12-31 NOTE — Discharge Summary (Signed)
Shelly Johnson, Shelly Johnson                 ACCOUNT NO.:  [id number]   MEDICAL RECORD NO.:  [id number]          PATIENT TYPE:  INP   LOCATION:  4742                         FACILITY:  [facility]   PHYSICIAN:  Georgina Quint. Plotnikov, MDDATE OF BIRTH:  [birth year]   DATE OF ADMISSION:  03/09/2008  DATE OF DISCHARGE:  03/12/2008                               DISCHARGE SUMMARY   DISCHARGE DIAGNOSES:  1. Severe iron deficiency anemia with heme-positive stools status post      RBC transfusion.  2. Shelly Johnson erosions causing severe iron deficiency anemia with heme-      positive stools.  3. Gastroesophageal reflux disease.  4. Near-syncope and dyspnea on exertion due to severe iron deficiency      anemia with heme-positive stools, resolved.  5. Elevated glucose.   DISCHARGE INSTRUCTIONS:  Call with problems.  Followup appointment with  Dr. Amador Cunas on Wednesday with CBC and a BMET.   DIET:  Reduce sugar.   ACTIVITY:  Increase activity slowly.   DISCHARGE MEDICATIONS:  1. Iron sulfate 325 mg twice daily, lifetime therapy.  2. Vitamin C 500 mg once a day with iron.  3. Protonix 40 mg daily.  4. Tylenol 650 mg p.o. b.i.d. p.r.n. pain.   HISTORY:  The patient is a 65 year old female, overall healthy, who  presented with complaint of weakness, shortness of breath, and  intermittent chest pressure over the past month.  On the day of  admission, she was extremely weak, almost passed out.  For the details,  please address to history and physical by Dr. Bayard Hugger on March 09, 2008.   HOSPITAL COURSE:  During the course of hospitalization, the patient was  found to be extremely anemic with a hemoglobin on admission of 5.8 and  MCV 60.  She was transfused with red blood cells.  GI consult was  obtained.  She was found to have guaiac-positive stool.  She was  transfused with red blood cells.  After her hemoglobin had improved, she  underwent EGD and colonoscopy by Dr. [name].  She was found to have  multiple  Cameron erosions in the stomach.  She developed back pain,  abdominal pain following the procedure that has resolved with Tylenol.  On the day of discharge, she has no active complaints, except for being  tired.  No chest pain and shortness of breath, occasional headache, no  back pain at present.   PHYSICAL EXAMINATION:  HEENT:  Moist mucosa.  She is not pale.  NECK:  Supple.  LUNGS:  Clear.  HEART:  S1 and S2.  ABDOMEN:  Soft and nontender.  LOWER EXTREMITIES:  Without edema.  GENERAL:  She is alert, oriented, and cooperative.  VITAL SIGNS:  Her blood pressure today is 153/90, heart rate 86,  respirations 20, temperature 97.8, and O2 sat 98% on room air.   On the day of discharge, her hemoglobin was 9.5, white count 6.5,  platelets 216, MCV 73.3.  Sodium 143, potassium 3.7, hemoglobin A1c  6.2%, vitamin B12 399, folate 18.6, and iron less than 10.  Colonoscopy  with diverticulosis.  No cause  for iron deficiency anemia on  colonoscopy.  EGD with hiatal hernia with Shelly Johnson erosions and GERD.   CONSULTATION:  Gastroenterology, Dr. [name].   PROCEDURES:  Colonoscopy and esophagogastroduodenoscopy on March 11, 2008.   FOLLOWUP RECOMMENDATIONS:  Lifetime PPI.  Check of hemoglobin.      Georgina Quint. Plotnikov, MD  Electronically Signed     AVP/MEDQ  D:  03/12/2008  T:  03/12/2008  Job:  [id number]   cc:   Shelly Savers, MD

## 2011-01-02 ENCOUNTER — Other Ambulatory Visit (INDEPENDENT_AMBULATORY_CARE_PROVIDER_SITE_OTHER): Payer: PRIVATE HEALTH INSURANCE | Admitting: Internal Medicine

## 2011-01-02 DIAGNOSIS — D649 Anemia, unspecified: Secondary | ICD-10-CM

## 2011-01-02 LAB — POCT HEMOGLOBIN: Hemoglobin: 11.7

## 2011-01-03 ENCOUNTER — Telehealth: Payer: Self-pay | Admitting: *Deleted

## 2011-01-03 NOTE — Telephone Encounter (Signed)
Stay on iron, prilosec and colace

## 2011-01-03 NOTE — Telephone Encounter (Signed)
Pt discharged from hospital with GI bleed.  Hgb down to 5.  Had labs rechecked this week, and her hgb was 11.7.  Asking if she should stay on Prilosec, Iron, Colace, Tylenol, and Multi vitamin and vit c?  Having knee pain and would like to be referred to Dr. Rennis Chris or Dr. Charlann Boxer.

## 2011-01-03 NOTE — Assessment & Plan Note (Signed)
[facility] OFFICE NOTE   NAME:Shelly Johnson, Sobecki                        MRN:          [id number]  DATE:09/01/2006                            DOB:          February 11, 1946    A 65 year old female who was seen today to reestablish.  She has enjoyed  excellent health.  For the past couple of weeks she has been ill.  Just  before Christmas she had a viral gastroenteritis that has resolved.  More recently she has had a refractory cough.  This is now largely  nonproductive and just a bit irritating, otherwise she feels well.  She  has a history of remote appendectomy and also surgery for __________  at  age 11 or 67, also a hysterectomy in 83.  She has no known allergies.   MEDICAL REGIMEN:  Includes p.r.n. Aleve, OTC allergy medications and  Prilosec.   PAST MEDICAL HISTORY:  She is married, 2 sons and 4 grandchildren.  Father died in his 15s of an MI.  Mother age 75 is in good health.  She  may have coronary artery disease and also a history of osteoporosis.  Several siblings, all half brothers and sisters, positive for  hypertension and diabetes.   EXAMINATION:  Revealed an overweight white female, no acute distress.  Blood pressure is 140/90.  Fundi, ear, nose and throat clear.  NECK:  No thyroid enlargement, adenopathy or bruits.  CHEST:  Was clear.  CARDIOVASCULAR:  Normal heart sounds, no murmurs.  ABDOMEN:  Obese, soft and nontender.  No organomegaly.  EXTREMITIES:  Negative.   IMPRESSION:  Viral bronchitis.   DISPOSITION:  She will be treated symptomatically.  She has been asked  to track her blood pressure and to receive gynecologic follow up.  This  she has planned in the near term.  The idea of a colonoscopy was  discussed and she will consider.     Gordy Savers, MD  Electronically Signed    PFK/MedQ  DD: 09/01/2006  DT: 09/02/2006  Job #: [id number]

## 2011-01-07 ENCOUNTER — Ambulatory Visit (INDEPENDENT_AMBULATORY_CARE_PROVIDER_SITE_OTHER): Payer: PRIVATE HEALTH INSURANCE | Admitting: Internal Medicine

## 2011-01-07 ENCOUNTER — Ambulatory Visit (INDEPENDENT_AMBULATORY_CARE_PROVIDER_SITE_OTHER)
Admission: RE | Admit: 2011-01-07 | Discharge: 2011-01-07 | Disposition: A | Discharge status: Home / Self Care | Payer: PRIVATE HEALTH INSURANCE | Source: Ambulatory Visit | Attending: Internal Medicine | Admitting: Internal Medicine

## 2011-01-07 ENCOUNTER — Encounter: Payer: Self-pay | Admitting: Internal Medicine

## 2011-01-07 ENCOUNTER — Telehealth: Payer: Self-pay | Admitting: *Deleted

## 2011-01-07 VITALS — BP 138/84 | HR 96

## 2011-01-07 DIAGNOSIS — M25569 Pain in unspecified knee: Secondary | ICD-10-CM

## 2011-01-07 DIAGNOSIS — IMO0001 Reserved for inherently not codable concepts without codable children: Secondary | ICD-10-CM

## 2011-01-07 DIAGNOSIS — M791 Myalgia, unspecified site: Secondary | ICD-10-CM

## 2011-01-07 NOTE — Progress Notes (Signed)
  Subjective:    Patient ID: Shelly Johnson, female    DOB: Aug 03, 1946, 65 y.o.   MRN: [id number]  HPI  Main issue is left knee pain--ongoing for weeks  She also describes diffuse aching---arms legs  GIBleed--resolved  Past Medical History  Diagnosis Date  . Anemia    Past Surgical History  Procedure Date  . Abdominal hysterectomy   . Appendectomy     reports that she has never smoked. She does not have any smokeless tobacco history on file. She reports that she drinks alcohol. She reports that she does not use illicit drugs. family history includes Arthritis in her mother; Cancer in her mother; Diabetes in her brothers; and Hypertension in her brother. No Known Allergies   Review of Systems  patient denies chest pain, shortness of breath, orthopnea. Denies lower extremity edema, abdominal pain, change in appetite, change in bowel movements. Patient denies rashes, musculoskeletal complaints. No other specific complaints in a complete review of systems.      Objective:   Physical Exam  Well-developed well-nourished female in no acute distress. HEENT exam atraumatic, normocephalic, extraocular muscles are intact. Neck is supple. No jugular venous distention no thyromegaly. Chest clear to auscultation without increased work of breathing. Cardiac exam S1 and S2 are regular. Abdominal exam active bowel sounds, soft, nontender. Extremities no edema. Neurologic exam she is alert without any motor sensory deficits. Gait-limping severely. Decreased ROM with some effusion-left knee        Assessment & Plan:  OA knee-presumptive dx Check xray  Myalgias--labs

## 2011-01-07 NOTE — Assessment & Plan Note (Signed)
Needs xray

## 2011-01-07 NOTE — Assessment & Plan Note (Signed)
Needs labs

## 2011-01-07 NOTE — Telephone Encounter (Signed)
Pt is coming in today to see Dr. Cato Mulligan.  Her knee complaints are severe, and not sure if it should wait until she can get a referral.

## 2011-01-07 NOTE — Telephone Encounter (Signed)
OK for referral to Ortho????

## 2011-01-07 NOTE — Telephone Encounter (Signed)
Pt is having severe pain and swelling of knee and hip.  Advised to come to the office for possible xrays, and evaluation by Dr. Cato Mulligan.

## 2011-01-21 ENCOUNTER — Telehealth: Payer: Self-pay | Admitting: *Deleted

## 2011-01-21 NOTE — Telephone Encounter (Signed)
Has question about labs and xrays and wants to speak with dr swords, only

## 2011-01-29 NOTE — Telephone Encounter (Signed)
copy of note given to dr swords

## 2011-02-10 ENCOUNTER — Encounter: Payer: Self-pay | Admitting: Family Medicine

## 2011-02-10 ENCOUNTER — Ambulatory Visit (INDEPENDENT_AMBULATORY_CARE_PROVIDER_SITE_OTHER): Payer: No Typology Code available for payment source | Admitting: Family Medicine

## 2011-02-10 VITALS — BP 140/100 | HR 84 | Temp 98.4°F | Wt 214.0 lb

## 2011-02-10 DIAGNOSIS — L0291 Cutaneous abscess, unspecified: Secondary | ICD-10-CM

## 2011-02-10 DIAGNOSIS — T6391XA Toxic effect of contact with unspecified venomous animal, accidental (unintentional), initial encounter: Secondary | ICD-10-CM

## 2011-02-10 DIAGNOSIS — T63391A Toxic effect of venom of other spider, accidental (unintentional), initial encounter: Secondary | ICD-10-CM

## 2011-02-10 DIAGNOSIS — T63301A Toxic effect of unspecified spider venom, accidental (unintentional), initial encounter: Secondary | ICD-10-CM

## 2011-02-10 DIAGNOSIS — L0390 Cellulitis, unspecified: Secondary | ICD-10-CM

## 2011-02-10 MED ORDER — DOXYCYCLINE HYCLATE 100 MG PO CAPS: 100.0000 mg | ORAL_CAPSULE | Freq: Two times a day (BID) | ORAL | Status: AC

## 2011-02-10 NOTE — Progress Notes (Signed)
  Subjective:    Patient ID: Shelly Johnson, female    DOB: July 15, 1946, 65 y.o.   MRN: [id number]  HPI 2 days ago while working in her yard, she felt a slight sharp pain and saw some blood on the lower right leg. She assumed it was a bite but never saw the offender. She applied Neosporin, but in the last 24 hours the area has become red, swollen, and mildly painful. No fever or HA or systemic symptoms.    Review of Systems  Constitutional: Negative.   Respiratory: Negative.   Cardiovascular: Negative.   Musculoskeletal: Negative.   Skin: Positive for wound.       Objective:   Physical Exam  Constitutional: She appears well-developed and well-nourished.  Skin:       The lateral lower right leg above the ankle has a an area of swelling, warmth, erythema, and tenderness abpout 5 cm in diameter. The center of this area has a single vessicle filled with serous fluid. A sample of this was sent for a culture.           Assessment & Plan:  Await the culture. Treat with Doxycycline. This is probably a spider bite

## 2011-02-20 ENCOUNTER — Telehealth: Payer: Self-pay | Admitting: *Deleted

## 2011-02-20 NOTE — Telephone Encounter (Signed)
Probably nothing else to do---triple antibiotic ointment ok Could be that she is allergic doxycycline

## 2011-02-20 NOTE — Telephone Encounter (Signed)
Pt was seen for a spider bite and was prescribed doxycycline 100mg  bid.  Finish antibiotic yesterday and on Tuesday she developed an itchy rash on her rt arm, between her breast, and lt knee.  The spider bite is gone except for a scab and she has been treating the rash with a triple antibiotic ointment.

## 2011-02-20 NOTE — Telephone Encounter (Signed)
Pt. Notified.

## 2011-05-16 LAB — CBC
HCT: 20.5 — ABNORMAL LOW
HCT: 29.8 — ABNORMAL LOW
Hemoglobin: 6.1 — CL
Hemoglobin: 8.6 — ABNORMAL LOW
MCHC: 29.8 — ABNORMAL LOW
MCV: 60.9 — ABNORMAL LOW
MCV: 73.3 — ABNORMAL LOW
Platelets: 288
RBC: 3.37 — ABNORMAL LOW
RBC: 4.06
RDW: 19.5 — ABNORMAL HIGH
RDW: 27.7 — ABNORMAL HIGH
WBC: 6.5
WBC: 7.1
WBC: 7.7

## 2011-05-16 LAB — COMPREHENSIVE METABOLIC PANEL
ALT: 19
AST: 23
Albumin: 4
Alkaline Phosphatase: 58
BUN: 10
CO2: 23
Calcium: 8.9
Chloride: 102
Creatinine, Ser: 0.9
GFR calc Af Amer: >60
GFR calc non Af Amer: >60
Glucose, Bld: 152 — ABNORMAL HIGH
Potassium: 4.3
Sodium: 135
Total Bilirubin: 0.6
Total Protein: 6.9

## 2011-05-16 LAB — HEMOGLOBIN AND HEMATOCRIT, BLOOD
HCT: 19.3 — ABNORMAL LOW
HCT: 27.9 — ABNORMAL LOW
HCT: 32 — ABNORMAL LOW
Hemoglobin: 5.6 — CL

## 2011-05-16 LAB — DIFFERENTIAL
Basophils Absolute: 0
Basophils Relative: 0
Eosinophils Absolute: 0
Eosinophils Relative: 0
Lymphocytes Relative: 11 — ABNORMAL LOW
Lymphs Abs: 0.8
Monocytes Absolute: 0.1
Monocytes Relative: 1 — ABNORMAL LOW
Neutro Abs: 6.2
Neutrophils Relative %: 88 — ABNORMAL HIGH

## 2011-05-16 LAB — TYPE AND SCREEN
ABO/RH(D): A POS
Antibody Screen: NEGATIVE

## 2011-05-16 LAB — BASIC METABOLIC PANEL
CO2: 22
Chloride: 109
GFR calc Af Amer: >60
Potassium: 3.7

## 2011-05-16 LAB — PROTIME-INR: INR: 0.9

## 2011-05-16 LAB — VITAMIN B12: Vitamin B-12: 399 (ref 211–911)

## 2011-05-16 LAB — POCT CARDIAC MARKERS
Myoglobin, poc: 53.2
Operator id: 146091

## 2011-05-16 LAB — CARDIAC PANEL(CRET KIN+CKTOT+MB+TROPI)
CK, MB: 0.9
Total CK: 28

## 2011-05-16 LAB — HEMOGLOBIN A1C
Hgb A1c MFr Bld: 6.2 — ABNORMAL HIGH
Mean Plasma Glucose: 143

## 2011-05-16 LAB — IRON AND TIBC: UIBC: 450

## 2011-05-16 LAB — PREPARE RBC (CROSSMATCH)

## 2011-05-16 LAB — FOLATE: Folate: 18.6

## 2011-05-16 LAB — RETICULOCYTES: Retic Count, Absolute: 59.3

## 2011-05-16 LAB — FERRITIN: Ferritin: <1 — ABNORMAL LOW (ref 10–291)

## 2011-05-16 LAB — CK TOTAL AND CKMB (NOT AT ARMC): Total CK: 28

## 2011-05-16 LAB — OCCULT BLOOD X 1 CARD TO LAB, STOOL: Fecal Occult Bld: NEGATIVE

## 2011-05-16 LAB — LIPASE, BLOOD: Lipase: 25

## 2011-05-16 LAB — T4, FREE: Free T4: 1.07

## 2011-05-16 LAB — TSH: TSH: 1.439

## 2012-01-21 ENCOUNTER — Telehealth: Payer: Self-pay | Admitting: Family Medicine

## 2012-01-21 NOTE — Telephone Encounter (Signed)
Pulled from Triage vmail - pt called at 8:18am. She ws out of town and got very ill. She just returned last night, and when she was out of town, that doctor to come and get a "UA and blood test" when she got home. Please call pt to advise. Thanks.

## 2012-01-21 NOTE — Telephone Encounter (Addendum)
Went to [facility] and went to the urgent care.  They told her she had a kidney infection.  [facility] Dr told her that she needed to get labs done when she got home from PCP.  No energy, h/a.  Scheduled OV with Dr Cato Mulligan 01/22/12 cause she refused to anyone else

## 2012-01-22 ENCOUNTER — Ambulatory Visit (INDEPENDENT_AMBULATORY_CARE_PROVIDER_SITE_OTHER): Payer: PRIVATE HEALTH INSURANCE | Admitting: Internal Medicine

## 2012-01-22 VITALS — BP 150/84 | Wt 210.0 lb

## 2012-01-22 DIAGNOSIS — IMO0001 Reserved for inherently not codable concepts without codable children: Secondary | ICD-10-CM

## 2012-01-22 DIAGNOSIS — M791 Myalgia, unspecified site: Secondary | ICD-10-CM

## 2012-01-22 LAB — HEPATIC FUNCTION PANEL
Bilirubin, Direct: 0 mg/dL (ref 0.0–0.3)
Total Bilirubin: 0.3 mg/dL (ref 0.3–1.2)

## 2012-01-22 LAB — CBC WITH DIFFERENTIAL/PLATELET
Basophils Absolute: 0 10*3/uL (ref 0.0–0.1)
Basophils Relative: 0.6 % (ref 0.0–3.0)
Eosinophils Absolute: 0.1 10*3/uL (ref 0.0–0.7)
Lymphocytes Relative: 30.9 % (ref 12.0–46.0)
MCHC: 32.5 g/dL (ref 30.0–36.0)
Monocytes Relative: 6.2 % (ref 3.0–12.0)
Neutrophils Relative %: 61 % (ref 43.0–77.0)
RBC: 4.05 Mil/uL (ref 3.87–5.11)
RDW: 13.8 % (ref 11.5–14.6)

## 2012-01-22 LAB — BASIC METABOLIC PANEL
Calcium: 8.9 mg/dL (ref 8.4–10.5)
Creatinine, Ser: 0.9 mg/dL (ref 0.4–1.2)
GFR: 63.72 mL/min (ref >60.00)

## 2012-01-22 LAB — SEDIMENTATION RATE: Sed Rate: 64 mm/hr — ABNORMAL HIGH (ref 0–22)

## 2012-01-22 NOTE — Progress Notes (Signed)
2 weeks ago-- bit by some sort of bug--- sxs resolved  1 week ago-- dysuria/frequency-- went to [facility] : UTI--treated with Ceftriaxone (had fever) and cipro. Urinary sxs are better  BUt... She still feels poorly: hurts all over, myalgias,  No rash, but she has had sweats and chills. She is on ABX.  Past Medical History  Diagnosis Date  . Anemia     History   Social History  . Marital Status: Widowed    Spouse Name: N/A    Number of Children: N/A  . Years of Education: N/A   Occupational History  . Not on file.   Social History Main Topics  . Smoking status: Never Smoker   . Smokeless tobacco: Not on file  . Alcohol Use: Yes  . Drug Use: No  . Sexually Active:    Other Topics Concern  . Not on file   Social History Narrative  . No narrative on file    Past Surgical History  Procedure Date  . Abdominal hysterectomy   . Appendectomy     Family History  Problem Relation Age of Onset  . Arthritis Mother   . Cancer Mother     uterine  . Diabetes Brother   . Hypertension Brother   . Diabetes Brother     Allergies  Allergen Reactions  . Hydrocodone Nausea And Vomiting    Current Outpatient Prescriptions on File Prior to Visit  Medication Sig Dispense Refill  . acetaminophen (TYLENOL) 500 MG tablet Take 500 mg by mouth every 6 (six) hours as needed.        . ferrous sulfate 325 (65 FE) MG tablet Take 325 mg by mouth daily with breakfast.      . omeprazole (PRILOSEC) 10 MG capsule Take 20 mg by mouth daily.       . vitamin C (ASCORBIC ACID) 500 MG tablet Take 500 mg by mouth daily.           patient denies chest pain, shortness of breath, orthopnea. Denies lower extremity edema, abdominal pain, change in appetite, change in bowel movements. Patient denies rashes, musculoskeletal complaints. No other specific complaints in a complete review of systems.   BP 150/84  Wt 210 lb (95.255 kg)  Well-developed well-nourished female in no acute distress. HEENT exam  atraumatic, normocephalic, extraocular muscles are intact. Neck is supple. No jugular venous distention no thyromegaly. Chest clear to auscultation without increased work of breathing. Cardiac exam S1 and S2 are regular. Abdominal exam active bowel sounds, soft, nontender. Extremities no edema. Neurologic exam she is alert without any motor sensory deficits. Gait is normal.  Assessment and plan: I think she needs further evaluation. I'll check laboratory work. I think there is a high probability she has polymyalgia rheumatica. We'll check sedimentation rate and additional laboratory work.

## 2012-01-23 ENCOUNTER — Other Ambulatory Visit: Payer: Self-pay | Admitting: *Deleted

## 2012-01-23 MED ORDER — PREDNISONE 10 MG PO TABS: ORAL_TABLET | ORAL | Status: DC

## 2012-01-24 LAB — URINE CULTURE: Organism ID, Bacteria: NO GROWTH

## 2012-01-27 ENCOUNTER — Telehealth: Payer: Self-pay | Admitting: Internal Medicine

## 2012-01-27 NOTE — Telephone Encounter (Signed)
Shelly Johnson missed a call back from [place]. RN advised urine culture was neg. She is still not feeling completely better. Pls call.

## 2012-01-27 NOTE — Telephone Encounter (Signed)
duplicate

## 2012-02-05 ENCOUNTER — Telehealth: Payer: Self-pay | Admitting: Family Medicine

## 2012-02-05 DIAGNOSIS — M353 Polymyalgia rheumatica: Secondary | ICD-10-CM

## 2012-02-05 NOTE — Telephone Encounter (Signed)
Please advise 

## 2012-02-05 NOTE — Telephone Encounter (Signed)
Message copied by Kathyrn Drown on Thu Feb 05, 2012 12:35 PM ------      Message from: Thomasena Edis      Created: Thu Feb 05, 2012 12:13 PM       Caller: Angie/Patient; Phone Number: [phone]; Message from caller: Patient states she wants to come off prednisone its making her feel worse.Marland Kitchen Please call her after 2:30

## 2012-02-05 NOTE — Telephone Encounter (Signed)
This came from [facility] as a staff message. Sending it on to you!

## 2012-02-06 ENCOUNTER — Telehealth: Payer: Self-pay | Admitting: Internal Medicine

## 2012-02-06 NOTE — Telephone Encounter (Signed)
  Caller: Angie/Patient is calling with a question about Prednisone.The medication was written by Birdie Sons.  Pt is having numbness, tingling, vision problems, emotional problems, trouble sleeping.  Phone message  by Dr. Cato Mulligan 02/05/12 and inst to start tapering Prenisone to 10 mg QD x 1 week and then 5 mg QD and F/U with Rheumatology.   She needs a referral for Rheumatology and also how long does she need to take the Prednisone 5 mg QD?  Please call her to advise. Her # is [phone] cell and home [phone].

## 2012-02-06 NOTE — Telephone Encounter (Signed)
duplicate

## 2012-02-06 NOTE — Telephone Encounter (Signed)
Decrease prednisone to 10 mg daily for one week and then 5 mg daily. Given her symptoms I would still like her to followup with rheumatology. Reschedule that appointment. Indication: possible polymyalgia rheumatica.

## 2012-02-06 NOTE — Telephone Encounter (Signed)
Pt aware, referral order placed 

## 2012-08-12 ENCOUNTER — Ambulatory Visit (INDEPENDENT_AMBULATORY_CARE_PROVIDER_SITE_OTHER)
Admission: RE | Admit: 2012-08-12 | Discharge: 2012-08-12 | Disposition: A | Discharge status: Home / Self Care | Payer: PRIVATE HEALTH INSURANCE | Source: Ambulatory Visit | Attending: Family Medicine | Admitting: Family Medicine

## 2012-08-12 ENCOUNTER — Ambulatory Visit (INDEPENDENT_AMBULATORY_CARE_PROVIDER_SITE_OTHER): Payer: PRIVATE HEALTH INSURANCE | Admitting: Family Medicine

## 2012-08-12 ENCOUNTER — Encounter: Payer: Self-pay | Admitting: Family Medicine

## 2012-08-12 VITALS — BP 118/72 | HR 68 | Temp 98.1°F | Ht 65.0 in | Wt 210.0 lb

## 2012-08-12 DIAGNOSIS — Z862 Personal history of diseases of the blood and blood-forming organs and certain disorders involving the immune mechanism: Secondary | ICD-10-CM

## 2012-08-12 DIAGNOSIS — R5383 Other fatigue: Secondary | ICD-10-CM

## 2012-08-12 DIAGNOSIS — R5381 Other malaise: Secondary | ICD-10-CM

## 2012-08-12 DIAGNOSIS — R059 Cough, unspecified: Secondary | ICD-10-CM

## 2012-08-12 DIAGNOSIS — R05 Cough: Secondary | ICD-10-CM

## 2012-08-12 LAB — CBC WITH DIFFERENTIAL/PLATELET
Basophils Relative: 0.6 % (ref 0.0–3.0)
Eosinophils Absolute: 0 10*3/uL (ref 0.0–0.7)
HCT: 37.1 % (ref 36.0–46.0)
Hemoglobin: 12 g/dL (ref 12.0–15.0)
Lymphocytes Relative: 55.8 % — ABNORMAL HIGH (ref 12.0–46.0)
MCHC: 32.3 g/dL (ref 30.0–36.0)
MCV: 88.7 fl (ref 78.0–100.0)
Monocytes Absolute: 0.3 10*3/uL (ref 0.1–1.0)
Neutro Abs: 1.2 10*3/uL — ABNORMAL LOW (ref 1.4–7.7)
RBC: 4.18 Mil/uL (ref 3.87–5.11)

## 2012-08-12 LAB — BASIC METABOLIC PANEL
BUN: 11 mg/dL (ref 6–23)
Chloride: 98 mEq/L (ref 96–112)
Creatinine, Ser: 0.9 mg/dL (ref 0.4–1.2)

## 2012-08-12 MED ORDER — LEVOFLOXACIN 500 MG PO TABS: 500.0000 mg | ORAL_TABLET | Freq: Every day | ORAL | Status: DC

## 2012-08-12 NOTE — Progress Notes (Signed)
  Subjective:    Patient ID: Shelly Johnson, female    DOB: [birth year], 66 y.o.   MRN: [id number]  HPI  Patient seen with one week history of progressive fatigue and cough. She says and chills was not taken her temperature up. Said difficulty sleeping off and on secondary to cough. Took some over-the-counter medication which apparently had Benadryl which did help with sleep. Denies any sore throat or significant nasal congestion. No nausea or vomiting. No chest pain. No pleuritic pain. No hemoptysis.  She's had profound fatigue which she relates to the past week or so. She had question of PMR back of the summer with myalgias and elevated sedimentation rate in the 60s range. To prednisone briefly but felt very agitated. She does not recall is making her myalgias improved. She was feeling well 2 weeks ago when cough and fatigue started. No dysuria. Nonsmoker. Currently takes no medications regularly  Past Medical History  Diagnosis Date  . Anemia    Past Surgical History  Procedure Date  . Abdominal hysterectomy   . Appendectomy     reports that she has never smoked. She does not have any smokeless tobacco history on file. She reports that she drinks alcohol. She reports that she does not use illicit drugs. family history includes Arthritis in her mother; Cancer in her mother; Diabetes in her brothers; and Hypertension in her brother. Allergies  Allergen Reactions  . Nsaids     Should avoid-- GI bleed  . Hydrocodone Nausea And Vomiting     Review of Systems  Constitutional: Positive for chills and fatigue. Negative for unexpected weight change.  HENT: Negative for congestion and sore throat.   Respiratory: Positive for cough. Negative for wheezing.   Gastrointestinal: Negative for abdominal pain.  Genitourinary: Negative for dysuria.  Neurological: Positive for weakness. Negative for dizziness and syncope.       Objective:   Physical Exam  Constitutional: She appears  well-developed and well-nourished.  HENT:  Right Ear: External ear normal.  Left Ear: External ear normal.  Mouth/Throat: Oropharynx is clear and moist.  Neck: Neck supple.  Cardiovascular: Normal rate and regular rhythm.   Pulmonary/Chest:       Patient had a few scattered crackles left base and anterior lung fields and this seemed to clear somewhat with deep breathing  Abdominal: Soft. She exhibits no distension and no mass. There is no tenderness. There is no rebound and no guarding.       Somewhat hyperactive bowel sounds.  Musculoskeletal: She exhibits no edema.  Lymphadenopathy:    She has no cervical adenopathy.  Neurological: She is alert.  Skin: No rash noted.          Assessment & Plan:  One week history of extreme fatigue and cough with no confirmed fever but chills off and on. Obtain chest x-ray to rule out pneumonia. She's had prior history of severe anemia. Check CBC. Levaquin 500 milligrams once daily for 10 days  Past history of questionable PMR but patient states she was doing relatively well about one week ago which makes this less likely-as explanation for her fatigue.

## 2012-08-19 ENCOUNTER — Telehealth: Payer: Self-pay | Admitting: Internal Medicine

## 2012-08-19 NOTE — Telephone Encounter (Signed)
Patient Information:  Caller Name: Oluwanifemi  Phone: [phone]  Patient: Shelly Johnson, Shelly Johnson  Gender: Female  DOB: [birth year]  Age: 67 Years  PCP: Birdie Sons (Adults only)  Office Follow Up:  Does the office need to follow up with this patient?: No  Instructions For The Office: N/A   Symptoms  Reason For Call & Symptoms: On her antibiotics since 12/26 and has had minimal improvement  Reviewed Health History In EMR: Yes  Reviewed Medications In EMR: Yes  Reviewed Allergies In EMR: Yes  Reviewed Surgeries / Procedures: Yes  Date of Onset of Symptoms: 08/06/2012  Treatments Tried: Levaquin  Treatments Tried Worked: No  Guideline(s) Used:  Cough  Disposition Per Guideline:   See Today or Tomorrow in Office  Reason For Disposition Reached:   Continuous (nonstop) coughing interferes with work or school and no improvement using cough treatment per Care Advice  Advice Given:  N/A  Appointment Scheduled:  08/20/2012 09:45:15 Appointment Scheduled Provider:  Eleonore Chiquito (Family Practice)

## 2012-08-20 ENCOUNTER — Ambulatory Visit (INDEPENDENT_AMBULATORY_CARE_PROVIDER_SITE_OTHER): Payer: PRIVATE HEALTH INSURANCE | Admitting: Internal Medicine

## 2012-08-20 ENCOUNTER — Encounter: Payer: Self-pay | Admitting: Internal Medicine

## 2012-08-20 VITALS — BP 150/90 | HR 102 | Temp 98.1°F | Resp 20 | Wt 209.0 lb

## 2012-08-20 DIAGNOSIS — J189 Pneumonia, unspecified organism: Secondary | ICD-10-CM

## 2012-08-20 MED ORDER — BENZONATATE 200 MG PO CAPS: 200.0000 mg | ORAL_CAPSULE | Freq: Two times a day (BID) | ORAL | Status: DC | PRN

## 2012-08-20 NOTE — Progress Notes (Signed)
Subjective:    Patient ID: Shelly Johnson, female    DOB: 20-Sep-1945, 67 y.o.   MRN: [id number]  HPI  67 year old patient who presents with a chief complaint of cough. She states symptoms began on December 20 and was seen and evaluated here on December 26. A chest x-ray was obtained at that time that revealed bilateral lower lobe atelectatic changes. She is completing 10 days of Levaquin. Cough is nonproductive. She has some mild nasal congestion but no fever chills or other constitutional complaints except for some fatigue. She has been on a number of OTC medications with marginal benefit. 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 Hydrocodone causes nausea  Past Medical History  Diagnosis Date  . Anemia     History   Social History  . Marital Status: Widowed    Spouse Name: N/A    Number of Children: N/A  . Years of Education: N/A   Occupational History  . Not on file.   Social History Main Topics  . Smoking status: Never Smoker   . Smokeless tobacco: Not on file  . Alcohol Use: Yes  . Drug Use: No  . Sexually Active:    Other Topics Concern  . Not on file   Social History Narrative  . No narrative on file    Past Surgical History  Procedure Date  . Abdominal hysterectomy   . Appendectomy     Family History  Problem Relation Age of Onset  . Arthritis Mother   . Cancer Mother     uterine  . Diabetes Brother   . Hypertension Brother   . Diabetes Brother     Allergies  Allergen Reactions  . Nsaids     Should avoid-- GI bleed  . Hydrocodone Nausea And Vomiting    Current Outpatient Prescriptions on File Prior to Visit  Medication Sig Dispense Refill  . acetaminophen (TYLENOL) 500 MG tablet Take 500 mg by mouth every 6 (six) hours as needed.        Marland Kitchen aspirin 81 MG tablet Take 81 mg by mouth daily.      . ferrous sulfate 325 (65 FE) MG tablet Take 325 mg by mouth daily with breakfast.      .  levofloxacin (LEVAQUIN) 500 MG tablet Take 1 tablet (500 mg total) by mouth daily.  10 tablet  0  . omeprazole (PRILOSEC) 10 MG capsule Take 20 mg by mouth daily.       . vitamin C (ASCORBIC ACID) 500 MG tablet Take 500 mg by mouth daily.        . predniSONE (DELTASONE) 10 MG tablet 2 pills po daily x2 days than one po daily  60 tablet  0    BP 150/90  Pulse 102  Temp 98.1 F (36.7 C) (Oral)  Resp 20  Wt 209 lb (94.802 kg)  SpO2 95%      Review of Systems  Constitutional: Positive for activity change, appetite change and fatigue. Negative for fever.  HENT: Positive for congestion. Negative for hearing loss, sore throat, rhinorrhea, dental problem, sinus pressure and tinnitus.   Eyes: Negative for pain, discharge and visual disturbance.  Respiratory: Positive for cough. Negative for shortness of breath.   Cardiovascular: Negative for chest pain, palpitations and leg swelling.  Gastrointestinal: Negative for nausea, vomiting, abdominal pain, diarrhea, constipation, blood in stool and abdominal distention.  Genitourinary: Negative for dysuria, urgency, frequency, hematuria, flank pain, vaginal bleeding, vaginal discharge,  difficulty urinating, vaginal pain and pelvic pain.  Musculoskeletal: Negative for joint swelling, arthralgias and gait problem.  Skin: Negative for rash.  Neurological: Negative for dizziness, syncope, speech difficulty, weakness, numbness and headaches.  Hematological: Negative for adenopathy.  Psychiatric/Behavioral: Negative for behavioral problems, dysphoric mood and agitation. The patient is not nervous/anxious.        Objective:   Physical Exam  Constitutional: She is oriented to person, place, and time. She appears well-developed and well-nourished.       Frequent paroxysms of coughing Afebrile  HENT:  Head: Normocephalic.  Right Ear: External ear normal.  Left Ear: External ear normal.  Mouth/Throat: Oropharynx is clear and moist.  Eyes:  Conjunctivae normal and EOM are normal. Pupils are equal, round, and reactive to light.  Neck: Normal range of motion. Neck supple. No thyromegaly present.  Cardiovascular: Normal rate, regular rhythm, normal heart sounds and intact distal pulses.   Pulmonary/Chest: Effort normal. She has rales.       Bibasilar rales O2 saturation 97% Pulse rate 100  Abdominal: Soft. Bowel sounds are normal. She exhibits no mass. There is no tenderness.  Musculoskeletal: Normal range of motion.  Lymphadenopathy:    She has no cervical adenopathy.  Neurological: She is alert and oriented to person, place, and time.  Skin: Skin is warm and dry. No rash noted.  Psychiatric: She has a normal mood and affect. Her behavior is normal.          Assessment & Plan:   Persistent cough. Patient has pulmonary rales on clinical exam. CBC 1 week ago revealed mild leukopenia. Suspect patient has a viral pneumonitis. She has 2 additional days of Levaquin therapy We'll treat her cough with Tessalon as well as Mucinex DM We'll ask to return in 2 weeks and perform a followup chest x-ray at that time

## 2012-08-20 NOTE — Patient Instructions (Signed)
Mucinex DM twice daily  Cough medicines as directed  Return in 2 weeks for followup with  followup chest x-ray

## 2013-03-22 ENCOUNTER — Telehealth: Payer: Self-pay | Admitting: Internal Medicine

## 2013-03-22 NOTE — Telephone Encounter (Signed)
Pt states that Dr Cato Mulligan told her that when she feels like she is low on blood again, to come in and get a blood test. She would like to do that. I think she's talking about a CBC. Pt knows he is on vacation, but wants to know if he or someone could order and she could come in.  Symptoms: no energy, fatigued. Is still taking her iron.

## 2013-03-23 NOTE — Telephone Encounter (Signed)
Spoke to pt and she will wait for Dr Cato Mulligan return.  She is fatigued and has no energy and per pt she was told at last OV with Dr Cato Mulligan that next time it happened she could just come in for labs.  I don't see anything documented in office note.  Please advise, pt aware it may be next week before she gets a call back

## 2013-03-25 NOTE — Telephone Encounter (Signed)
Done

## 2013-03-25 NOTE — Telephone Encounter (Incomplete)
Cbc, bmet, tsh

## 2013-03-25 NOTE — Telephone Encounter (Signed)
Cbc, bmet, lfts, tsh, esr for fatigue

## 2013-03-25 NOTE — Telephone Encounter (Signed)
Please schedule labs

## 2013-03-29 ENCOUNTER — Other Ambulatory Visit (INDEPENDENT_AMBULATORY_CARE_PROVIDER_SITE_OTHER): Payer: Medicare Other

## 2013-03-29 DIAGNOSIS — D649 Anemia, unspecified: Secondary | ICD-10-CM

## 2013-03-29 DIAGNOSIS — I10 Essential (primary) hypertension: Secondary | ICD-10-CM

## 2013-03-29 LAB — CBC WITH DIFFERENTIAL/PLATELET
Basophils Absolute: 0 10*3/uL (ref 0.0–0.1)
Hemoglobin: 11.6 g/dL — ABNORMAL LOW (ref 12.0–15.0)
Lymphocytes Relative: 36.5 % (ref 12.0–46.0)
Monocytes Relative: 7.4 % (ref 3.0–12.0)
Neutro Abs: 2.1 10*3/uL (ref 1.4–7.7)
RDW: 15.3 % — ABNORMAL HIGH (ref 11.5–14.6)
WBC: 4 10*3/uL — ABNORMAL LOW (ref 4.5–10.5)

## 2013-03-29 LAB — BASIC METABOLIC PANEL
Calcium: 9 mg/dL (ref 8.4–10.5)
GFR: 74.32 mL/min (ref >60.00)
Glucose, Bld: 92 mg/dL (ref 70–99)
Sodium: 142 mEq/L (ref 135–145)

## 2013-03-29 LAB — HEPATIC FUNCTION PANEL
AST: 20 U/L (ref 0–37)
Alkaline Phosphatase: 47 U/L (ref 39–117)
Total Bilirubin: 0.4 mg/dL (ref 0.3–1.2)

## 2013-03-31 ENCOUNTER — Telehealth: Payer: Self-pay | Admitting: Internal Medicine

## 2013-03-31 NOTE — Telephone Encounter (Signed)
Pt would like results of labs done 8/12.

## 2013-03-31 NOTE — Telephone Encounter (Signed)
See result note.  

## 2013-05-19 ENCOUNTER — Telehealth: Payer: Self-pay | Admitting: Internal Medicine

## 2013-05-19 ENCOUNTER — Other Ambulatory Visit: Payer: Self-pay

## 2013-05-19 DIAGNOSIS — Z1231 Encounter for screening mammogram for malignant neoplasm of breast: Secondary | ICD-10-CM

## 2013-05-19 NOTE — Telephone Encounter (Signed)
Pt is calling to request a follow-up visit with Dr. Cato Mulligan, which is scheduled for 07/18/13. She is requesting labs to be completed ahead of time. Please assist.

## 2013-05-20 NOTE — Telephone Encounter (Signed)
Per Dr Cato Mulligan note pt needs CBC only now.  Please schedule lab appt only for today or next week

## 2013-05-20 NOTE — Telephone Encounter (Signed)
L/m on v/m - ga °

## 2013-05-20 NOTE — Telephone Encounter (Signed)
Pt changed her mind..Pt will wait and will come in fasting that day.  Pt would like you to send a copy of her last labs done 8/12.

## 2013-06-23 ENCOUNTER — Ambulatory Visit
Admission: RE | Admit: 2013-06-23 | Discharge: 2013-06-23 | Disposition: A | Discharge status: Home / Self Care | Payer: Medicare Other | Source: Ambulatory Visit

## 2013-06-23 DIAGNOSIS — Z1231 Encounter for screening mammogram for malignant neoplasm of breast: Secondary | ICD-10-CM | POA: Diagnosis not present

## 2013-07-18 ENCOUNTER — Encounter: Payer: Self-pay | Admitting: Internal Medicine

## 2013-07-18 ENCOUNTER — Ambulatory Visit (INDEPENDENT_AMBULATORY_CARE_PROVIDER_SITE_OTHER): Payer: Medicare Other | Admitting: Internal Medicine

## 2013-07-18 VITALS — BP 150/95 | HR 88 | Temp 98.2°F | Ht 65.0 in | Wt 213.0 lb

## 2013-07-18 DIAGNOSIS — IMO0001 Reserved for inherently not codable concepts without codable children: Secondary | ICD-10-CM

## 2013-07-18 DIAGNOSIS — D509 Iron deficiency anemia, unspecified: Secondary | ICD-10-CM | POA: Diagnosis not present

## 2013-07-18 DIAGNOSIS — K449 Diaphragmatic hernia without obstruction or gangrene: Secondary | ICD-10-CM | POA: Diagnosis not present

## 2013-07-18 DIAGNOSIS — D649 Anemia, unspecified: Secondary | ICD-10-CM

## 2013-07-18 DIAGNOSIS — E785 Hyperlipidemia, unspecified: Secondary | ICD-10-CM | POA: Diagnosis not present

## 2013-07-18 DIAGNOSIS — Z23 Encounter for immunization: Secondary | ICD-10-CM | POA: Diagnosis not present

## 2013-07-18 LAB — CBC WITH DIFFERENTIAL/PLATELET
Basophils Absolute: 0 10*3/uL (ref 0.0–0.1)
Eosinophils Absolute: 0.1 10*3/uL (ref 0.0–0.7)
Eosinophils Relative: 1.3 % (ref 0.0–5.0)
HCT: 39 % (ref 36.0–46.0)
Lymphs Abs: 1.4 10*3/uL (ref 0.7–4.0)
MCHC: 32.5 g/dL (ref 30.0–36.0)
Monocytes Absolute: 0.2 10*3/uL (ref 0.1–1.0)
Monocytes Relative: 5.9 % (ref 3.0–12.0)
Neutro Abs: 2.3 10*3/uL (ref 1.4–7.7)
Platelets: 210 10*3/uL (ref 150.0–400.0)
RDW: 14.8 % — ABNORMAL HIGH (ref 11.5–14.6)
WBC: 4 10*3/uL — ABNORMAL LOW (ref 4.5–10.5)

## 2013-07-18 LAB — BASIC METABOLIC PANEL
Calcium: 9.1 mg/dL (ref 8.4–10.5)
Chloride: 102 mEq/L (ref 96–112)
GFR: 77.51 mL/min (ref >60.00)
Glucose, Bld: 91 mg/dL (ref 70–99)
Potassium: 4.7 mEq/L (ref 3.5–5.1)
Sodium: 139 mEq/L (ref 135–145)

## 2013-07-18 LAB — LIPID PANEL
HDL: 65.5 mg/dL (ref >39.00)
Total CHOL/HDL Ratio: 3
Triglycerides: 111 mg/dL (ref 0.0–149.0)
VLDL: 22.2 mg/dL (ref 0.0–40.0)

## 2013-07-18 LAB — IBC PANEL: Saturation Ratios: 6.7 % — ABNORMAL LOW (ref 20.0–50.0)

## 2013-07-18 LAB — HEPATIC FUNCTION PANEL
Albumin: 4 g/dL (ref 3.5–5.2)
Alkaline Phosphatase: 45 U/L (ref 39–117)
Bilirubin, Direct: 0 mg/dL (ref 0.0–0.3)
Total Bilirubin: 0.3 mg/dL (ref 0.3–1.2)

## 2013-07-18 LAB — FERRITIN: Ferritin: 14.9 ng/mL (ref 10.0–291.0)

## 2013-07-18 NOTE — Progress Notes (Signed)
Hx of GI bleed- no recent issues. Cameron Lesions from [facility]. She has been noncompliant with PPI  Stress- she admits to some anxiety but she is able to "deal with it"  Health maint- she has not had immunizations.  She has had some neck pain-- uses rare Aleve (she is advised never to take any NSAID)  Reviewed pmh, psh, sochx Reviewed meds  Ros: as above, no other significant complaints  BP 150/95  Pulse 88  Temp(Src) 98.2 F (36.8 C) (Oral)  Ht 5\' 5"  (1.651 m)  Wt 213 lb (96.616 kg)  BMI 35.44 kg/m2  Well-developed well-nourished female in no acute distress. HEENT exam atraumatic, normocephalic, extraocular muscles are intact. Neck is supple. No jugular venous distention no thyromegaly. Chest clear to auscultation without increased work of breathing. Cardiac exam S1 and S2 are regular. Abdominal exam active bowel sounds, soft, nontender. Extremities no edema. Neurologic exam she is alert without any motor sensory deficits. Gait is normal.   A/p Myalgias- check esr Hiatal hernea with cameron erosions- check labs. She should stay on ppi Anemia- check labs

## 2013-07-18 NOTE — Assessment & Plan Note (Signed)
Check labs today Including iron studies

## 2013-07-18 NOTE — Assessment & Plan Note (Signed)
No sxs She needs to take PPI Avoid all NSAIDs

## 2013-07-19 DIAGNOSIS — H179 Unspecified corneal scar and opacity: Secondary | ICD-10-CM | POA: Diagnosis not present

## 2013-07-19 DIAGNOSIS — H5200 Hypermetropia, unspecified eye: Secondary | ICD-10-CM | POA: Diagnosis not present

## 2013-07-20 ENCOUNTER — Telehealth: Payer: Self-pay | Admitting: Internal Medicine

## 2013-07-20 NOTE — Telephone Encounter (Signed)
Pt would like results of labs. Would also like to know if it is normal to get a lump after an injection? The lump is in the left arm.

## 2013-07-21 NOTE — Addendum Note (Signed)
Addended by: Alfred Levins D on: 07/21/2013 10:30 AM   Modules accepted: Orders

## 2013-07-21 NOTE — Telephone Encounter (Signed)
See results for information Ok to have lump-- call for associated redness or significant pain

## 2013-07-21 NOTE — Telephone Encounter (Signed)
Pt aware of both.

## 2014-03-30 ENCOUNTER — Telehealth: Payer: Self-pay | Admitting: Internal Medicine

## 2014-03-30 NOTE — Telephone Encounter (Signed)
Pt would like to transfer to dr panosh. Can I sch? °

## 2014-04-05 NOTE — Telephone Encounter (Signed)
Pt called back to ask if Dr Regis Bill has made a decision to accept her as a transfer from Dr Leanne Chang

## 2014-04-12 ENCOUNTER — Ambulatory Visit (INDEPENDENT_AMBULATORY_CARE_PROVIDER_SITE_OTHER): Payer: Medicare Other | Admitting: Family Medicine

## 2014-04-12 ENCOUNTER — Encounter: Payer: Self-pay | Admitting: Family Medicine

## 2014-04-12 VITALS — BP 149/92 | HR 88 | Temp 97.8°F | Wt 216.0 lb

## 2014-04-12 DIAGNOSIS — D649 Anemia, unspecified: Secondary | ICD-10-CM

## 2014-04-12 DIAGNOSIS — F341 Dysthymic disorder: Secondary | ICD-10-CM

## 2014-04-12 DIAGNOSIS — R5383 Other fatigue: Principal | ICD-10-CM

## 2014-04-12 DIAGNOSIS — R300 Dysuria: Secondary | ICD-10-CM | POA: Diagnosis not present

## 2014-04-12 DIAGNOSIS — R5381 Other malaise: Secondary | ICD-10-CM | POA: Diagnosis not present

## 2014-04-12 LAB — COMPREHENSIVE METABOLIC PANEL
ALBUMIN: 3.7 g/dL (ref 3.5–5.2)
ALT: 21 U/L (ref 0–35)
AST: 19 U/L (ref 0–37)
Alkaline Phosphatase: 47 U/L (ref 39–117)
BUN: 17 mg/dL (ref 6–23)
CALCIUM: 8.7 mg/dL (ref 8.4–10.5)
CHLORIDE: 105 meq/L (ref 96–112)
CO2: 27 meq/L (ref 19–32)
Creatinine, Ser: 0.8 mg/dL (ref 0.4–1.2)
GFR: 73.05 mL/min (ref >60.00)
GLUCOSE: 85 mg/dL (ref 70–99)
POTASSIUM: 4.8 meq/L (ref 3.5–5.1)
SODIUM: 140 meq/L (ref 135–145)
TOTAL PROTEIN: 6.7 g/dL (ref 6.0–8.3)
Total Bilirubin: 0.3 mg/dL (ref 0.2–1.2)

## 2014-04-12 LAB — CBC WITH DIFFERENTIAL/PLATELET
Basophils Absolute: 0.1 10*3/uL (ref 0.0–0.1)
Basophils Relative: 1 % (ref 0.0–3.0)
EOS PCT: 2.1 % (ref 0.0–5.0)
Eosinophils Absolute: 0.1 10*3/uL (ref 0.0–0.7)
HCT: 31 % — ABNORMAL LOW (ref 36.0–46.0)
Hemoglobin: 9.9 g/dL — ABNORMAL LOW (ref 12.0–15.0)
LYMPHS PCT: 34.1 % (ref 12.0–46.0)
Lymphs Abs: 1.9 10*3/uL (ref 0.7–4.0)
MCHC: 31.9 g/dL (ref 30.0–36.0)
MCV: 90.3 fl (ref 78.0–100.0)
Monocytes Absolute: 0.3 10*3/uL (ref 0.1–1.0)
Monocytes Relative: 6.1 % (ref 3.0–12.0)
NEUTROS PCT: 56.7 % (ref 43.0–77.0)
Neutro Abs: 3.1 10*3/uL (ref 1.4–7.7)
PLATELETS: 239 10*3/uL (ref 150.0–400.0)
RBC: 3.43 Mil/uL — ABNORMAL LOW (ref 3.87–5.11)
RDW: 15.5 % (ref 11.5–15.5)
WBC: 5.5 10*3/uL (ref 4.0–10.5)

## 2014-04-12 LAB — POCT URINALYSIS DIPSTICK
Bilirubin, UA: NEGATIVE
Blood, UA: NEGATIVE
Glucose, UA: NEGATIVE
Ketones, UA: NEGATIVE
Nitrite, UA: NEGATIVE
PH UA: 5.5
PROTEIN UA: NEGATIVE
Spec Grav, UA: 1.015
UROBILINOGEN UA: 0.2

## 2014-04-12 LAB — TSH: TSH: 1.55 u[IU]/mL (ref 0.35–4.50)

## 2014-04-12 NOTE — Progress Notes (Signed)
  Garret Reddish, MD Phone: [phone]  Subjective:   Shelly Johnson is a 68 y.o. year old very pleasant female patient who presents with the following:  Fatigue/recent dysuria and diarrhea Dysuria/polyuria/nocturia started 8/18 or 8/20. No fevers/chills. Slight low back pain. Started to get better 2-3 days ago and symptoms have completely resolved.   Diarrhea started 2 nights ago (4-5x in night, 7 total by noon and started night before). No diarrhea since that time. Has been very tired since that time.   Patient's primary concern is that even though she has been fatigued after this recent illness she feels that she has been fatigued for months if not years. Has tried to lose weight, exercise 5 days a weke but unable to lose weight. She gets tired doing yard work after 3 hours. She is concern of stress can be playing a role-see social history. She feels that overall she is a different person since her husband died. PH 2 of 0.  ROS- no fevers chills nausea or vomiting. No constipation or changes in bowel habits other than diarrhea which is already resolved. No SI HI.  Past Medical History-history of anemia which appears to be related to GI bleed, anxiety depression and patient not on treatment Social history- no drugs, nonsmoker, 0-1 drinks per week. Lives alone. Stressors-family, worry, economy[place]have to get materials out. Retired.   Medications- reviewed and updated Current Outpatient Prescriptions  Medication Sig Dispense Refill  . aspirin 81 MG tablet Take 81 mg by mouth daily.      . Cyanocobalamin (B-12) 1000 MCG SUBL Place 1 tablet under the tongue daily.      . ferrous sulfate 325 (65 FE) MG tablet Take 325 mg by mouth daily with breakfast.      . vitamin C (ASCORBIC ACID) 500 MG tablet Take 500 mg by mouth daily.        . naproxen sodium (ALEVE) 220 MG tablet Take 220 mg by mouth daily.      . pseudoephedrine (SUDAFED) 30 MG tablet Take 30 mg by mouth every 4  (four) hours as needed for congestion.       No current facility-administered medications for this visit.    Objective: BP 149/92  Pulse 88  Temp(Src) 97.8 F (36.6 C)  Wt 216 lb (97.977 kg) Gen: NAD, resting comfortably HEENT: no obvious pallor CV: RRR no murmurs rubs or gallops Lungs: CTAB no crackles, wheeze, rhonchi Abdomen: soft/nontender/nondistended/normal bowel sounds. Specifically no suprapubic pain Ext: trace edema Skin: warm, dry, no rash  Assessment/Plan:  Fatigue and difficulty losing weight CBC (anemia history-unclear origin), CMET, TSH Suspect fatigue and patient feeling differently is related to stressors and suspect lab work will be normal. Patient will return in a few weeks for an establish visit and we will discuss further.  Recent dysuria now resolved UA but suspect patient has cleared her ailment.  Diarrhea Suspect gastroenteritis now resolved.  Of note diastolic blood pressure was mildly elevated. We'll recheck at next visit as previously well-controlled  Orders Placed This Encounter  Procedures  . CBC with Differential  . Comprehensive metabolic panel    [place]  . TSH    [place]  . POCT urinalysis dipstick    In house

## 2014-04-12 NOTE — Patient Instructions (Signed)
Let's have you back in 1-2 weeks for an establish visit (Tell front ok to add you as 3rd or 4th 30 minute slot)  Check labs today. Believe you had a stomach bug and possible urine infection. These seem to be clearing. Labs will give [place] more information on your fatigue and difficulty losing weight.   Health Maintenance Due  Topic Date Due  . Zostavax  01/15/2008  . Pneumococcal Polysaccharide Vaccine Age 66 And Over  01/14/2013  . Influenza Vaccine  03/18/2014

## 2014-04-25 ENCOUNTER — Ambulatory Visit (INDEPENDENT_AMBULATORY_CARE_PROVIDER_SITE_OTHER): Payer: Medicare Other | Admitting: Family Medicine

## 2014-04-25 ENCOUNTER — Encounter: Payer: Self-pay | Admitting: Family Medicine

## 2014-04-25 VITALS — BP 130/84 | HR 80 | Temp 98.3°F | Wt 216.0 lb

## 2014-04-25 DIAGNOSIS — D509 Iron deficiency anemia, unspecified: Secondary | ICD-10-CM | POA: Diagnosis not present

## 2014-04-25 DIAGNOSIS — Z23 Encounter for immunization: Secondary | ICD-10-CM

## 2014-04-25 DIAGNOSIS — R5381 Other malaise: Secondary | ICD-10-CM

## 2014-04-25 DIAGNOSIS — F341 Dysthymic disorder: Secondary | ICD-10-CM

## 2014-04-25 DIAGNOSIS — R5383 Other fatigue: Secondary | ICD-10-CM

## 2014-04-25 DIAGNOSIS — R209 Unspecified disturbances of skin sensation: Secondary | ICD-10-CM

## 2014-04-25 DIAGNOSIS — D649 Anemia, unspecified: Secondary | ICD-10-CM | POA: Diagnosis not present

## 2014-04-25 DIAGNOSIS — R202 Paresthesia of skin: Secondary | ICD-10-CM

## 2014-04-25 LAB — IRON AND TIBC
%SAT: 6 % — ABNORMAL LOW (ref 20–55)
IRON: 26 ug/dL — AB (ref 42–145)
TIBC: 425 ug/dL (ref 250–470)
UIBC: 399 ug/dL (ref 125–400)

## 2014-04-25 LAB — RHEUMATOID FACTOR

## 2014-04-25 LAB — CBC
HCT: 32.7 % — ABNORMAL LOW (ref 36.0–46.0)
HEMOGLOBIN: 10.2 g/dL — AB (ref 12.0–15.0)
MCHC: 31.2 g/dL (ref 30.0–36.0)
MCV: 92.7 fl (ref 78.0–100.0)
Platelets: 269 10*3/uL (ref 150.0–400.0)
RBC: 3.53 Mil/uL — ABNORMAL LOW (ref 3.87–5.11)
RDW: 17.2 % — ABNORMAL HIGH (ref 11.5–15.5)
WBC: 4.5 10*3/uL (ref 4.0–10.5)

## 2014-04-25 LAB — SEDIMENTATION RATE: SED RATE: 26 mm/h — AB (ref 0–22)

## 2014-04-25 NOTE — Patient Instructions (Addendum)
With your history of a stomach bleed, I want the stomach doctor to evaluate you again.   I am getting labs today for further information but regardless still like for you to be seen by stomach doctor.   Let's check in 1-2 weeks after your visit with them or sooner if symptoms were to worsen. i still think depression could be the underlying cause of your symptoms.

## 2014-04-25 NOTE — Progress Notes (Signed)
Stephen Hunter, MD Phone: [phone]  Subjective:  Patient presents today to establish care with me as their new primary care provider. Patient was formerly a patient of Dr. Swords. Primary focus of visit is to discuss fatigue with recently noted anemia Chief complaint-noted.   Anemia/Fatigue Fatigue for several months if not years. Patient with extensive ROS. Continues to deny depression symptoms. States she was told by Dr. Swords needed to consider rheumatology potentially. Regarding anemia, patient has had a history of GI bleed with hgb as low as 5.9. Patient does not like taking medications and does not want to be on an antidepressant. She had a hysterectomy at some point for a "few cancer cells" and does not remember her last pap smear.   ROS-intermittent dark tarry stools (but on iron). No hematuria/hemoptysis/bright red blood per rectum. Feels like bones hurt all over, skin feels tender at times, hot/cold swings but no night sweats, difficulty focusing, thin hair and nails at times but then goes away, intermittent cough over last 6 months, right neck soreness with stress. No chest pain or shortness of breath.  The following were reviewed and entered/updated in epic: Past Medical History  Diagnosis Date  . Anemia   . Anxiety and depression     during loss of husband   Patient Active Problem List   Diagnosis Date Noted  . ANEMIA-NOS 03/16/2008    Priority: Medium  . ANXIETY DEPRESSION 03/08/2008    Priority: Medium  . HIATAL HERNIA WITH REFLUX 03/08/2008    Priority: Low   Past Surgical History  Procedure Laterality Date  . Abdominal hysterectomy      states had a "few cancer cells" but they "got everything". ? ovaries and cervix.   . Appendectomy      Family History  Problem Relation Age of Onset  . Arthritis Mother   . Cancer Mother     uterine  . Diabetes Brother   . Hypertension Brother     Medications- reviewed and updated Current Outpatient Prescriptions    Medication Sig Dispense Refill  . aspirin 81 MG tablet Take 81 mg by mouth daily.      . Cyanocobalamin (B-12) 1000 MCG SUBL Place 1 tablet under the tongue daily.      . ferrous sulfate 325 (65 FE) MG tablet Take 325 mg by mouth daily with breakfast.      . naproxen sodium (ALEVE) 220 MG tablet Take 220 mg by mouth daily.      . pseudoephedrine (SUDAFED) 30 MG tablet Take 30 mg by mouth every 4 (four) hours as needed for congestion.      . vitamin C (ASCORBIC ACID) 500 MG tablet Take 500 mg by mouth daily.         No current facility-administered medications for this visit.    Allergies-reviewed and updated Allergies  Allergen Reactions  . Nsaids     Should avoid-- GI bleed  . Hydrocodone Nausea And Vomiting    History   Social History  . Marital Status: Widowed    Spouse Name: N/A    Number of Children: N/A  . Years of Education: N/A   Social History Main Topics  . Smoking status: Never Smoker   . Smokeless tobacco: None  . Alcohol Use: Yes     Comment: 1 per month  . Drug Use: No  . Sexual Activity: None   Other Topics Concern  . None   Social History Narrative   Widowed 03/02/2007. 2 sons. 4 grandkids (  2 girls/2 boys).       Retired-from interior designing      Hobbies: design, family/friends time, decorative books, yardwork          ROS--See HPI   Objective: BP 130/84  Pulse 80  Temp(Src) 98.3 F (36.8 C)  Wt 216 lb (97.977 kg) Gen: NAD, resting comfortably HEENT: no obvious pallor, mild erythema of pharyx, turbinates slightly inflammed and swollen CV: RRR no murmurs rubs or gallops Lungs: CTAB no crackles, wheeze, rhonchi Abdomen: soft/nontender/nondistended/normal bowel sounds.  Ext: trace edema Skin: warm, dry, no rash  Assessment/Plan:  ANXIETY DEPRESSION Suggested to patient if workup unrevealing for fatigue/anemia and hgb returns to normal with persistence of symptoms, I would strongly encourage SSRI. She is not interested in meeting with  a counselor  ANEMIA-NOS Recheck CBC for trend. Also check ferritin, iron, tibc, vitamin b12, folate. Send to GI given history of GI bleed and possible melena. I do not know if anemia would account for level of fatigue patient experiences. I do suspect depression could be playing a role.   Patient concerned of autoimmune or inflammatory disorder with joint aches/pain for last several months at least-check ANA, RF, ESR. Consider rheum referral.    >50% of 30 minute office visit was spent on counseling (comforting patient due to multiple ailments/concerns) and coordination of care   Orders Placed This Encounter  Procedures  . CBC    [place]  . Ferritin  . Iron and TIBC  . Vitamin B12  . Folate  . ANA  . Rheumatoid Factor  . Sedimentation rate    [place]  . Ambulatory referral to Gastroenterology    Referral Priority:  Routine    Referral Type:  Consultation    Referral Reason:  Specialty Services Required    Requested Specialty:  Gastroenterology    Number of Visits Requested:  1

## 2014-04-25 NOTE — Assessment & Plan Note (Addendum)
Suggested to patient if workup unrevealing for fatigue/anemia and hgb returns to normal with persistence of symptoms, I would strongly encourage SSRI. She is not interested in meeting with a counselor. Symptoms have been worse over last few months but may have been for years since loss of husband.

## 2014-04-25 NOTE — Assessment & Plan Note (Signed)
Recheck CBC for trend. Also check ferritin, iron, tibc, vitamin b12, folate. Send to GI given history of GI bleed and possible melena. I do not know if anemia would account for level of fatigue patient experiences. I do suspect depression could be playing a role.   Patient concerned of autoimmune or inflammatory disorder with joint aches/pain for last several months at least-check ANA, RF, ESR. Consider rheum referral.

## 2014-04-26 LAB — VITAMIN B12

## 2014-04-26 LAB — ANA: Anti Nuclear Antibody(ANA): NEGATIVE

## 2014-04-26 LAB — FERRITIN: Ferritin: 32.7 ng/mL (ref 10.0–291.0)

## 2014-04-26 LAB — FOLATE: FOLATE: 18.4 ng/mL (ref >5.9)

## 2014-04-28 ENCOUNTER — Encounter: Payer: Self-pay | Admitting: Internal Medicine

## 2014-05-30 ENCOUNTER — Other Ambulatory Visit: Payer: Self-pay

## 2014-05-30 DIAGNOSIS — Z1231 Encounter for screening mammogram for malignant neoplasm of breast: Secondary | ICD-10-CM

## 2014-06-27 ENCOUNTER — Ambulatory Visit (INDEPENDENT_AMBULATORY_CARE_PROVIDER_SITE_OTHER): Payer: Medicare Other | Admitting: Internal Medicine

## 2014-06-27 ENCOUNTER — Other Ambulatory Visit (INDEPENDENT_AMBULATORY_CARE_PROVIDER_SITE_OTHER): Payer: Medicare Other

## 2014-06-27 ENCOUNTER — Encounter: Payer: Self-pay | Admitting: Internal Medicine

## 2014-06-27 VITALS — BP 130/88 | HR 96 | Ht 61.5 in | Wt 197.1 lb

## 2014-06-27 DIAGNOSIS — K253 Acute gastric ulcer without hemorrhage or perforation: Secondary | ICD-10-CM

## 2014-06-27 DIAGNOSIS — D509 Iron deficiency anemia, unspecified: Secondary | ICD-10-CM

## 2014-06-27 DIAGNOSIS — R197 Diarrhea, unspecified: Secondary | ICD-10-CM

## 2014-06-27 DIAGNOSIS — R1084 Generalized abdominal pain: Secondary | ICD-10-CM

## 2014-06-27 DIAGNOSIS — K449 Diaphragmatic hernia without obstruction or gangrene: Secondary | ICD-10-CM | POA: Diagnosis not present

## 2014-06-27 LAB — CBC WITH DIFFERENTIAL/PLATELET
BASOS ABS: 0 10*3/uL (ref 0.0–0.1)
Basophils Relative: 0.5 % (ref 0.0–3.0)
Eosinophils Absolute: 0 10*3/uL (ref 0.0–0.7)
Eosinophils Relative: 0.7 % (ref 0.0–5.0)
HCT: 44 % (ref 36.0–46.0)
Hemoglobin: 14 g/dL (ref 12.0–15.0)
LYMPHS PCT: 28.8 % (ref 12.0–46.0)
Lymphs Abs: 1.7 10*3/uL (ref 0.7–4.0)
MCHC: 32 g/dL (ref 30.0–36.0)
MCV: 86.4 fl (ref 78.0–100.0)
MONOS PCT: 6.2 % (ref 3.0–12.0)
Monocytes Absolute: 0.4 10*3/uL (ref 0.1–1.0)
Neutro Abs: 3.7 10*3/uL (ref 1.4–7.7)
Neutrophils Relative %: 63.8 % (ref 43.0–77.0)
PLATELETS: 194 10*3/uL (ref 150.0–400.0)
RBC: 5.09 Mil/uL (ref 3.87–5.11)
RDW: 14.7 % (ref 11.5–15.5)
WBC: 5.8 10*3/uL (ref 4.0–10.5)

## 2014-06-27 LAB — IRON: Iron: 32 ug/dL — ABNORMAL LOW (ref 42–145)

## 2014-06-27 LAB — FERRITIN: FERRITIN: 22.1 ng/mL (ref 10.0–291.0)

## 2014-06-27 MED ORDER — FERROUS SULFATE 325 (65 FE) MG PO TABS: 325.0000 mg | ORAL_TABLET | Freq: Two times a day (BID) | ORAL | Status: AC

## 2014-06-27 MED ORDER — OMEPRAZOLE 40 MG PO CPDR: 40.0000 mg | DELAYED_RELEASE_CAPSULE | Freq: Every day | ORAL | Status: DC

## 2014-06-27 NOTE — Patient Instructions (Addendum)
Your physician has requested that you go to the basement for the following lab work before leaving today:  CBC, Ferritin, Iron  Increase your iron to twice a day  We have sent the following medications to your pharmacy for you to pick up at your convenience:  Iron, Omeprazole  Make sure your primary care doctor checks your blood counts multiple times a year

## 2014-06-27 NOTE — Progress Notes (Signed)
HISTORY OF PRESENT ILLNESS:  Shelly Johnson is a 68 y.o. female with past medical history as listed below who is sent today by her primary care provider regarding recurrent iron deficiency anemia. As well, the patient has concerns over abdominal pain and multiple loose bowel movements per day. She states that she "wants to know was going on". The patient's outside records, other office notes, laboratories, x-rays, and prior endoscopy reports of all been reviewed. She has a history of iron deficiency anemia for which she was hospitalized in July 2009 and required blood transfusions. She underwent colonoscopy and upper endoscopy on 03/11/2008. Colonoscopy revealed left-sided diverticulosis and hemorrhoids. No other abnormalities. Follow-up in 10 years recommended. Upper endoscopy revealed a 7 cm hiatal hernia with associated Cameron erosions. This was felt to be the cause of anemia (iron deficiency), and Hemoccult-positive stool. She also had GERD symptoms. Twice daily iron therapy indefinitely was recommended along with PPI to control GERD symptoms. I have not seen the patient since. She was hospitalized in April 2012 for nausea with vomiting, dark stools, and recurrent iron deficiency anemia. Upper endoscopy was performed by Dr. Collene Johnson. She was found to have a large hiatal hernia with multiple Cameron erosions as well as a small antral ulcer. Testing for Helicobacter pylori was negative. The patient was found to be anemic in August 2015 when she complained of fatigue. Hemoglobin was 9.9. Iron studies consistent with iron deficiency (saturation 6%). Last hemoglobin in September was 10.2. She has been on once daily iron (65 mg of elemental iron) since 2009. She continues with intermittent reflux symptoms for which she takes omeprazole as needed. Occasional dysphagia. Occasional nausea. Next, the patient reports prescribing to some dietary process that requires multiple supplements. She's been on this for about 6 weeks.  She has lost 20 pounds. Included in the many supplements his magnesium citrate 6 times daily. She tells me that she has had cramping abdominal discomfort with diarrhea since initiating the diet. However, she has not stopped siting a desire to lose more weight and cost of the program, to be the issue. She tells me that she has "3 more weeks to go". She denies bleeding, fevers, or nocturnal symptoms.  REVIEW OF SYSTEMS:  All non-GI ROS negative except for sinus and allergy trouble anxiety, arthritis, confusion, cough, fatigue, muscle cramps, shortness of breath, ankle swelling, urinary leakage, throat clearing  Past Medical History  Diagnosis Date  . Anemia   . Anxiety and depression     during loss of husband  . GI bleed     hx of; hgb as low as 5.9  . Hiatal hernia   . GERD (gastroesophageal reflux disease)   . Diverticulosis   . Hemorrhoids   . Arthritis   . Obesity   . Pneumonia   . Urinary tract bacterial infections     Past Surgical History  Procedure Laterality Date  . Abdominal hysterectomy      states had a "few cancer cells" but they "got everything". ? ovaries and cervix.   Marland Kitchen Appendectomy    . Hiatal hernia repair      Social History Shelly Johnson  reports that she has never smoked. She does not have any smokeless tobacco history on file. She reports that she drinks alcohol. She reports that she does not use illicit drugs.  family history includes Arthritis in her mother; Breast cancer in her maternal grandmother; Cancer in her mother; Colon cancer (age of onset: 15) in her maternal aunt;  Diabetes in her brother; Hypertension in her brother. There is no history of Colon polyps or Esophageal cancer.  Allergies  Allergen Reactions  . Nsaids     Should avoid-- GI bleed  . Hydrocodone Nausea And Vomiting       PHYSICAL EXAMINATION: Vital signs: BP 130/88 mmHg  Pulse 96  Ht 5' 1.5" (1.562 m)  Wt 197 lb 2 oz (89.415 kg)  BMI 36.65 kg/m2  Constitutional: generally  well-appearing, no acute distress Psychiatric: alert and oriented x3, cooperative Eyes: extraocular movements intact, anicteric, conjunctiva pink Mouth: oral pharynx moist, no lesions Neck: supple no lymphadenopathy Cardiovascular: heart regular rate and rhythm, no murmur Lungs: clear to auscultation bilaterally Abdomen: soft,obese, nontender, nondistended, no obvious ascites, no peritoneal signs, normal bowel sounds, no organomegaly Rectal:omitted Extremities: no lower extremity edema bilaterally Skin: no lesions on visible extremities Neuro: No focal deficits. No asterixis.    ASSESSMENT:  #1. Recurrent iron deficiency anemia. Prior GI workups have identified Cameron erosions (erosions associated with large gastric hiatal hernia) as the cause. #2. GERD. Ongoing symptoms. #3. Hiatal hernia with Cameron erosions #4. Colonoscopy 2009 with diverticulosis and hemorrhoids #5. Recent problems with abdominal cramping discomfort and diarrhea most likely secondary to aggressive dietary supplement program   PLAN:  #1. CBC and iron studies today. #2. Increase iron sulfate 325 mg twice a day #3. PCP should monitor blood counts periodically to avoid waiting for symptomatic anemia. If iron deficiency despite oral iron supplementation, she could be sent for periodic iron infusion therapy. . If iron deficiency anemia were refractory to medical therapies and she required transfusions, surgical repair of hiatal hernia could be considered. Hopefully this will not be needed #4. Omeprazole 20 mg daily. This to control GERD symptoms. As well, to prevent ulcer (had small ulcer on previous EGD) as she takes occasional NSAIDs #5. Repeat screening colonoscopy around July 2019 #6. Return to Shelly Johnson for ongoing general medical care

## 2014-06-28 ENCOUNTER — Telehealth: Payer: Self-pay | Admitting: Internal Medicine

## 2014-06-28 NOTE — Telephone Encounter (Signed)
Spoke with pt and she is aware. States she will take the diet a day at a time.

## 2014-06-28 NOTE — Telephone Encounter (Signed)
Pt said Dr. Henrene Pastor told her to stop the diet she is on and she wants to know why?  She was told to increase her Iron to 2 tablets per day and she wants to know if she needs to increase her Vitamin C as well? Her Omeprazole was too expensive and she bought the OTC 20mg  and wants to know if she can take 2 tablets to equivalent to the 40mg . And calling about Lab results

## 2014-06-28 NOTE — Telephone Encounter (Signed)
Called pt and discussed lab results with her. Pt knows to take 2 iron pills daily. She states she can get OTC prilosec 20mg  cheaper than her script for 40mg . Pt instructed to take 2-20mg  prilosec and that would be fine. Pt wants to know why Dr. Henrene Pastor told her to stop her [employer] diet, states she only has 3 more weeks to go on the plan. Also wants to know if she should increase her vitamin c to 2 a day. Please advise.

## 2014-06-28 NOTE — Telephone Encounter (Signed)
Okay to change to other form of Prilosec. Additional vitamin C would be fine. In terms of her diet, it seemed to be causing lots of GI distress. She wants to continue to put up with this for an additional 3 weeks, that is fine by me. Thanks

## 2014-07-06 ENCOUNTER — Ambulatory Visit
Admission: RE | Admit: 2014-07-06 | Discharge: 2014-07-06 | Disposition: A | Discharge status: Home / Self Care | Payer: Medicare Other | Source: Ambulatory Visit

## 2014-07-06 DIAGNOSIS — Z1231 Encounter for screening mammogram for malignant neoplasm of breast: Secondary | ICD-10-CM | POA: Diagnosis not present

## 2014-07-17 ENCOUNTER — Ambulatory Visit (INDEPENDENT_AMBULATORY_CARE_PROVIDER_SITE_OTHER): Payer: Medicare Other | Admitting: Family Medicine

## 2014-07-17 ENCOUNTER — Telehealth: Payer: Self-pay | Admitting: Family Medicine

## 2014-07-17 ENCOUNTER — Encounter: Payer: Self-pay | Admitting: Family Medicine

## 2014-07-17 VITALS — BP 120/62 | Temp 97.9°F | Wt 195.0 lb

## 2014-07-17 DIAGNOSIS — E669 Obesity, unspecified: Secondary | ICD-10-CM | POA: Diagnosis not present

## 2014-07-17 DIAGNOSIS — R1013 Epigastric pain: Secondary | ICD-10-CM

## 2014-07-17 NOTE — Telephone Encounter (Signed)
Normal, 1 year follow up.

## 2014-07-17 NOTE — Patient Instructions (Addendum)
Quit the diet! Go back to eating real foods and drinking real drinks. Work on portion sizes.   I suspect this pain will resolve within 3-4 weeks. If it persists into next year, call me or Dr. Henrene Pastor.   6 months to a year follow up unless something comes up or doesn't resolve.   My 5 to Fitness!  5: fruits and vegetables per day (work on 9 per day if you are at 5) 4: exercise 4-5 times per week for at least 30 minutes (walking counts!) 3: meals per day (don't skip breakfast!) 2: habits to quit  -smoking  -excess alcohol use (men >2 beer/day; women >1beer/day) 0-1: sweet per day (2 cookies, 1 small cup of ice cream). Not on a daily basis.  Watch portion size  These are general tips for healthy living. Try to start with 1 or 2 habit TODAY and make it a part of your life for several months.   Once you have 1 or 2 habits down for several months, try to begin working on your next healthy habit. With every single step you take, you will be leading a healthier lifestyle!

## 2014-07-17 NOTE — Progress Notes (Signed)
Garret Reddish, MD Phone: [phone]  Subjective:   Shelly Johnson is a 68 y.o. year old very pleasant female patient who presents with the following:  Abdominal and back pain Obesity Started a diet around beginning of October for 8 weeks and had excruciating pain in abdomen due to aggressive dietary supplements. Made her go to bathroom >10 times a day for stoool. No oil PO, no lotions. Eat plain chicken, spinach, broccoli, fish, beef, steak. Stopped diet after 8 weeks and is on stabilization diet for 1 more week.   Pain started in abdomen during her diet related to this diet within 1 week. Wrapped around to her back. And then up into shoulder blades.She was advised by GI to stop diet. Diet was giving her formula 1 pills which seemed to make things worse and the diet encouraged her to increase due to pain. This started 3 weeks into diet and seems to be slowly improving as she comes off the diet. Was expensive program so wanted to finish. Pain now 5-8/10 but previously 10/10 as coming off of diet. Pain not exertional and not relieved by rest. Seems to be worsened by foods of diet  She did lose 24 lbs during this time and is continuing 5 mornings a week for 45 minutes exercising. Interested in maintenance exercise program and eating plan  ROS-No chest pain or shortness of breath. No sweating. No diarrhea/constipation. No orthopnea/pnd  Past Medical History- Patient Active Problem List   Diagnosis Date Noted  . ANEMIA-NOS 03/16/2008    Priority: Medium  . ANXIETY DEPRESSION 03/08/2008    Priority: Medium  . HIATAL HERNIA WITH REFLUX 03/08/2008    Priority: Low   Medications- reviewed and updated Current Outpatient Prescriptions  Medication Sig Dispense Refill  . aspirin 81 MG tablet Take 81 mg by mouth daily. Pt takes 3 times a week    . Cyanocobalamin (B-12) 1000 MCG SUBL Place 1 tablet under the tongue daily.    . ferrous sulfate 325 (65 FE) MG tablet Take 1 tablet (325 mg total) by  mouth 2 (two) times daily. 60 tablet 3  . MAGNESIUM CITRATE PO Take 1 tablet by mouth 6 (six) times daily. Pt takes for diet    . omeprazole (PRILOSEC) 40 MG capsule Take 1 capsule (40 mg total) by mouth daily. 30 capsule 3  . pseudoephedrine (SUDAFED) 30 MG tablet Take 30 mg by mouth every 4 (four) hours as needed for congestion.    . vitamin C (ASCORBIC ACID) 500 MG tablet Take 500 mg by mouth daily.      . Wheat Dextrin (BENEFIBER PO) Take by mouth daily. Pt takes 1 Tablespoon of Benefiber every day for diet    . naproxen sodium (ALEVE) 220 MG tablet Take 220 mg by mouth as needed.      No current facility-administered medications for this visit.    Objective: BP 120/62 mmHg  Temp(Src) 97.9 F (36.6 C)  Wt 195 lb (88.451 kg) Gen: NAD, resting comfortably on table CV: RRR no murmurs rubs or gallops Lungs: CTAB no crackles, wheeze, rhonchi Abdomen: soft/very mild epigastric pain/nondistended/normal bowel sounds. No rebound or guarding.  MSK: no pain to palpation of back (mid or upper). No cva tenderness.  Ext: trace edema Skin: warm, dry, no rash   Assessment/Plan:  Abdominal/back pain-appears to be related to her atypical diet plan> i wonder if the gaseous distension irritated her diaphragm causing the back and shoulder blade discomfort. Abdominal exam benign today. Pain improving as  coming off of diet and has less gas. Gave strict return precautions but do not believe we need imaging at this time or further blood work given improvement with time off of diet.   Obesity-patient did lose 24 lbs but suffered intolerable side effects. We discussed stopping Dieting and focus on eating real foods and drinking real drinks as well as further suggestions (summarized in AVS)  >50% of 25 minute office visit was spent on counseling (avoiding trendy/fad diets, responding to body when in pain related to diets, counseling for appropriate dieting/exercise)  and coordination of care

## 2014-07-17 NOTE — Telephone Encounter (Signed)
Dr. Yong Channel are you able to provide these results? In the note on the mammogram that she had on 07/06/14 is states that a letter would be mailed directly to the patient directly.

## 2014-07-17 NOTE — Telephone Encounter (Signed)
Pt would like mammogram results for [facility]

## 2014-07-17 NOTE — Telephone Encounter (Signed)
Pt.notified

## 2014-08-21 ENCOUNTER — Ambulatory Visit: Payer: Medicare Other | Admitting: Family Medicine

## 2014-11-15 ENCOUNTER — Telehealth: Payer: Self-pay | Admitting: Family Medicine

## 2014-11-15 NOTE — Telephone Encounter (Signed)
Noted  

## 2014-11-15 NOTE — Telephone Encounter (Signed)
Patient Name: Shelly Johnson DOB: 1946/06/28 Initial Comment Caller states she started with tickle in throat, now has cough and congestion. Pharmacist advised to take Mucinex DM and allergy meds. Is out of town. Has been doing for 11 days. Has questions. Nurse Assessment Nurse: Marcelline Deist, RN, [place] Date/Time (Eastern Time): 11/15/2014 12:58:56 PM Confirm and document reason for call. If symptomatic, describe symptoms. ---Caller states she started with tickle in throat, now has cough and congestion. Pharmacist advised to take Mucinex DM and allergy meds. Is out of town. Has been doing for 11 days. Has questions. Has improved somewhat, but cough has continued with drainage. Was coughing up green mucus, not now. Not sure if fever or not. Has the patient traveled out of the country within the last 30 days? ---Not Applicable Does the patient require triage? ---Yes Related visit to physician within the last 2 weeks? ---No Does the PT have any chronic conditions? (i.e. diabetes, asthma, etc.) ---No Guidelines Guideline Title Affirmed Question Affirmed Notes Cough - Acute Productive Cough present > 10 days Final Disposition User See PCP When Office is Open (within 3 days) Marcelline Deist, RN, [employer] has not tried nasal saline washes as yet, or a humidifier, or honey. She is out of town, so can not be scheduled for an appt. Although she is feeling better in general, the cough is still bothering her & she has some sinus congestion. She may go to a walk-in clinic or UC to be seen.

## 2014-11-27 ENCOUNTER — Encounter: Payer: Self-pay | Admitting: Family Medicine

## 2014-11-27 ENCOUNTER — Telehealth: Payer: Self-pay | Admitting: Family Medicine

## 2014-11-27 ENCOUNTER — Ambulatory Visit (INDEPENDENT_AMBULATORY_CARE_PROVIDER_SITE_OTHER): Payer: Medicare Other | Admitting: Family Medicine

## 2014-11-27 VITALS — BP 150/82 | HR 86 | Temp 97.8°F | Wt 201.0 lb

## 2014-11-27 DIAGNOSIS — J329 Chronic sinusitis, unspecified: Secondary | ICD-10-CM | POA: Diagnosis not present

## 2014-11-27 DIAGNOSIS — A499 Bacterial infection, unspecified: Secondary | ICD-10-CM

## 2014-11-27 DIAGNOSIS — B9689 Other specified bacterial agents as the cause of diseases classified elsewhere: Secondary | ICD-10-CM

## 2014-11-27 MED ORDER — AMOXICILLIN-POT CLAVULANATE 875-125 MG PO TABS: 1.0000 | ORAL_TABLET | Freq: Two times a day (BID) | ORAL | Status: DC

## 2014-11-27 NOTE — Progress Notes (Signed)
  PCP: Garret Reddish, MD  Subjective:  Shelly Johnson is a 69 y.o. year old very pleasant female patient who presents with Sinusitis concerns.    3 weeks ago started with cold like symptoms with nasal congestion, daylong cough productive of yellow/green sputum and sinus pressure. No sore throat. Minimal rhinorrhea. Seemed severe for 2 weeks. Called here and was advised neti pot and she did this. Got almost 90% better 4-5 days ago then felt a resurgence 3 days ago. Continued pressure throughout sinuses throughout this entire time that had pretty much resolved then recurred. Occasional wheeze.  -previous treatments: mucinex DM -sick contacts/travel/risks: denies flu exposure.  -Hx of: allergies  ROS-denies fever, SOB, NVD, tooth pain  Pertinent Past Medical History- anemia, anxiety  Medications- reviewed  Current Outpatient Prescriptions  Medication Sig Dispense Refill  . aspirin 81 MG tablet Take 81 mg by mouth daily. Pt takes 3 times a week    . Cyanocobalamin (B-12) 1000 MCG SUBL Place 1 tablet under the tongue daily.    . ferrous sulfate 325 (65 FE) MG tablet Take 1 tablet (325 mg total) by mouth 2 (two) times daily. 60 tablet 3  . MAGNESIUM CITRATE PO Take 1 tablet by mouth 6 (six) times daily. Pt takes for diet    . naproxen sodium (ALEVE) 220 MG tablet Take 220 mg by mouth as needed.     Marland Kitchen omeprazole (PRILOSEC) 40 MG capsule Take 1 capsule (40 mg total) by mouth daily. 30 capsule 3  . vitamin C (ASCORBIC ACID) 500 MG tablet Take 500 mg by mouth daily.      . Wheat Dextrin (BENEFIBER PO) Take by mouth daily. Pt takes 1 Tablespoon of Benefiber every day for diet    . pseudoephedrine (SUDAFED) 30 MG tablet Take 30 mg by mouth every 4 (four) hours as needed for congestion.      Objective: BP 150/82 mmHg  Pulse 86  Temp(Src) 97.8 F (36.6 C)  Wt 201 lb (91.173 kg)  SpO2 97% Gen: NAD, resting comfortably HEENT: Turbinates erythematous with yellow drainage, TM normal, pharynx  mildly erythematous with no tonsilar exudate or edema, bilateral maxillary sinus tenderness CV: RRR no murmurs rubs or gallops Lungs: CTAB no crackles, wheeze, rhonchi Abdomen: soft/nontender/nondistended/normal bowel sounds. No rebound or guarding.  Ext: no edema Skin: warm, dry, no rash   Assessment/Plan:  Bacterial sinusitis  Based off symptoms > 10 days and double-sickening pattern  Treat with Augmentin x 8 days  Follow up if no improvement in 10 days or new or worsening symptoms   Finally, we reviewed reasons to return to care including:   if symptoms worsen or persist or new concerns arise.  Meds ordered this encounter  Medications  . amoxicillin-clavulanate (AUGMENTIN) 875-125 MG per tablet    Sig: Take 1 tablet by mouth 2 (two) times daily.    Dispense:  16 tablet    Refill:  0    We had a very long discussion about potential treatments including antibiotics, prednisone, time, OTC therapies and eventually patient opted for antibiotics after originally deciding against them. We discussed antibiotic overuse, body's healing mechanisms, need for rest. Subsequently, >50% of 25 minute office visit was spent on counseling and coordination of care

## 2014-11-27 NOTE — Patient Instructions (Addendum)
Bacterial sinusitis  Based off symptoms > 10 days and double-sickening (mproving then worsening)  Treat with Augmentin x 8 days  Follow up if no improvement in 10 days or new or worsening symptoms

## 2014-11-27 NOTE — Telephone Encounter (Signed)
Error/njr °

## 2014-12-01 ENCOUNTER — Telehealth: Payer: Self-pay | Admitting: Family Medicine

## 2014-12-01 NOTE — Telephone Encounter (Signed)
Pt was to inform dr hunter of her prognosis: Pt would like dr hunter to know she is a "little" better. But not completley well.  Pt still has aggravating, nagging cough,  (not as bad). Stuffy nose.  The antibiotic sort of messes w/ her stomach, burt she can tolerate. But not working as well as she had hoped. This has been ongoing a month, and pt would like to know how long she can expect this to go on?  [employer] [facility]

## 2014-12-01 NOTE — Telephone Encounter (Signed)
Call her Monday, if symptoms persist, let's do a 7 day course of prednisone 40 mg, 2 tabs for 3 days then 1 tab for 4 days. Follow up in office if symptoms persist 10 days past treatment

## 2014-12-01 NOTE — Telephone Encounter (Signed)
See below

## 2014-12-04 IMAGING — MG STANDARD SCREENING - COMBO
6 of 10 series · 6 of 26 positions shown · non-contrast
Comparison: Previous exam(s).

CLINICAL DATA: Screening.

EXAM:
DIGITAL SCREENING BILATERAL MAMMOGRAM WITH CAD
DIGITAL BREAST TOMOSYNTHESIS
Digital breast tomosynthesis images are acquired in two projections.
These images are reviewed in combination with the digital mammogram,
confirming the findings below.

[R MLO (1 of 2)]
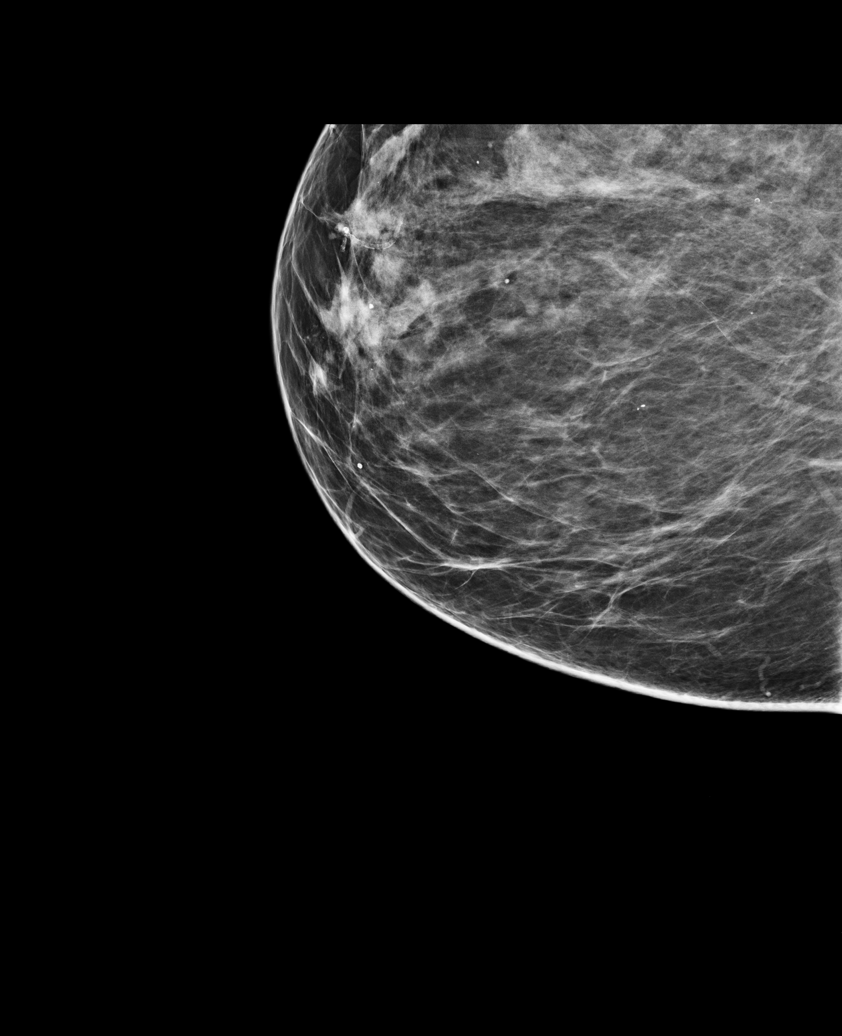

[L MLO (1 of 2)]
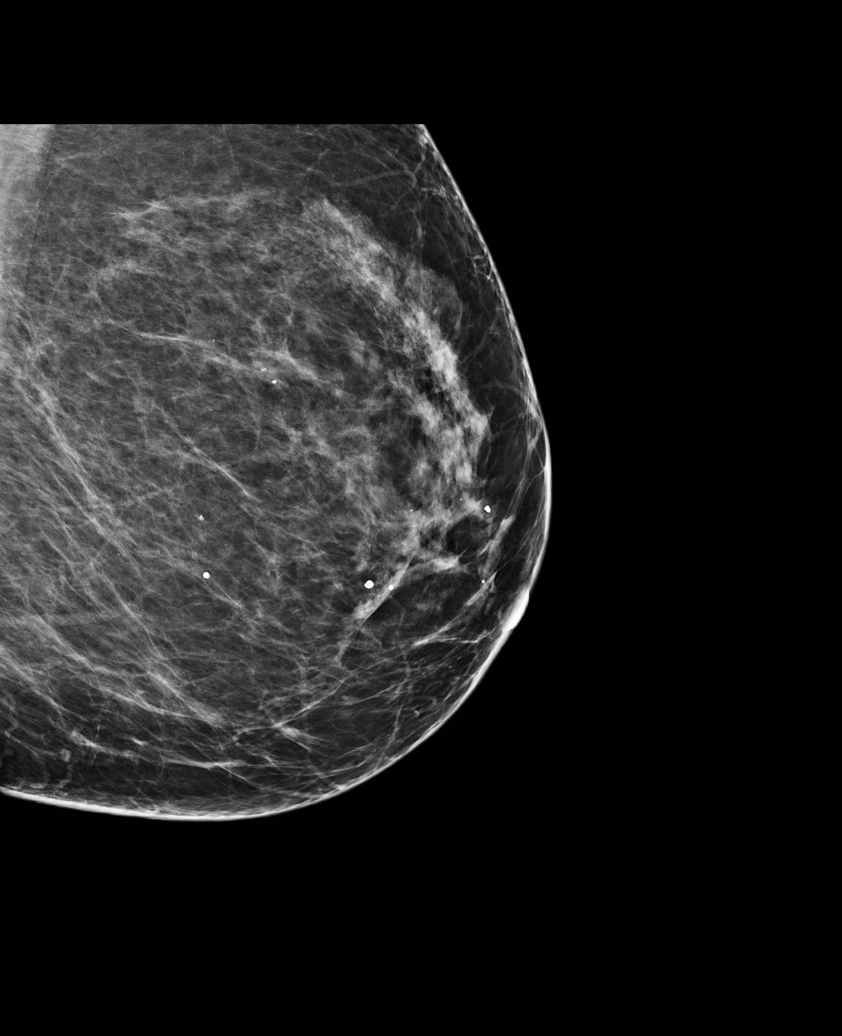

[R MLO (2 of 2)]
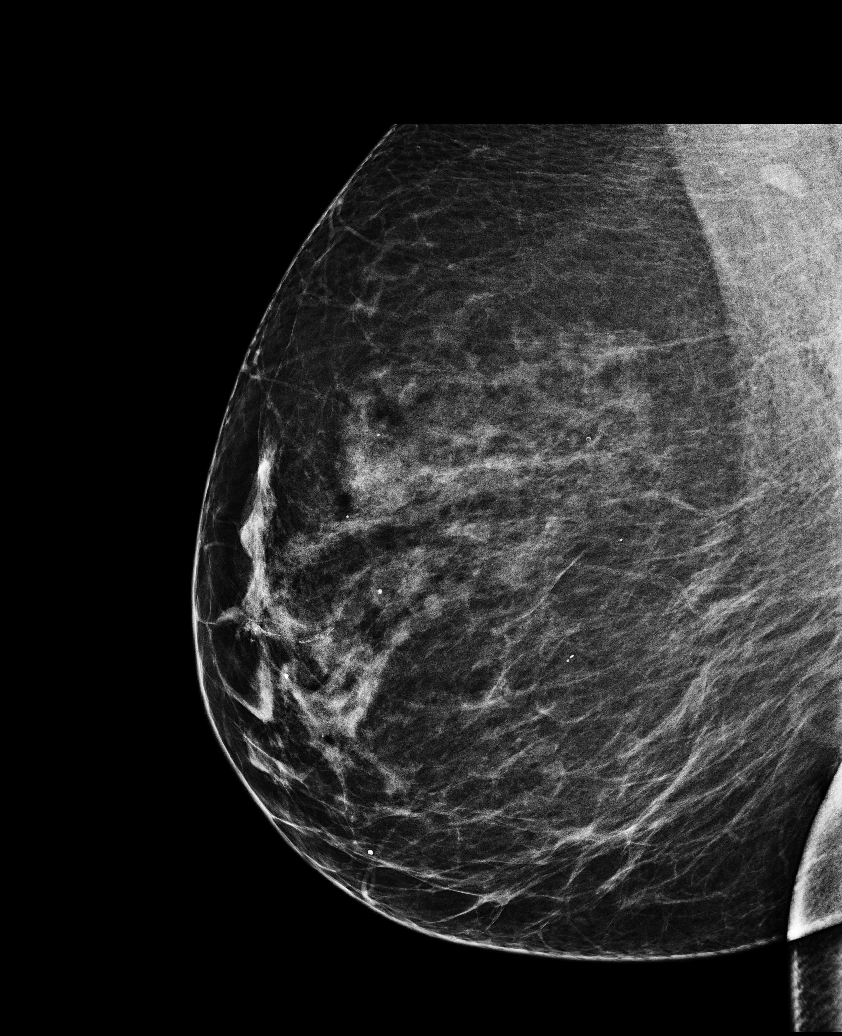

[R CC]
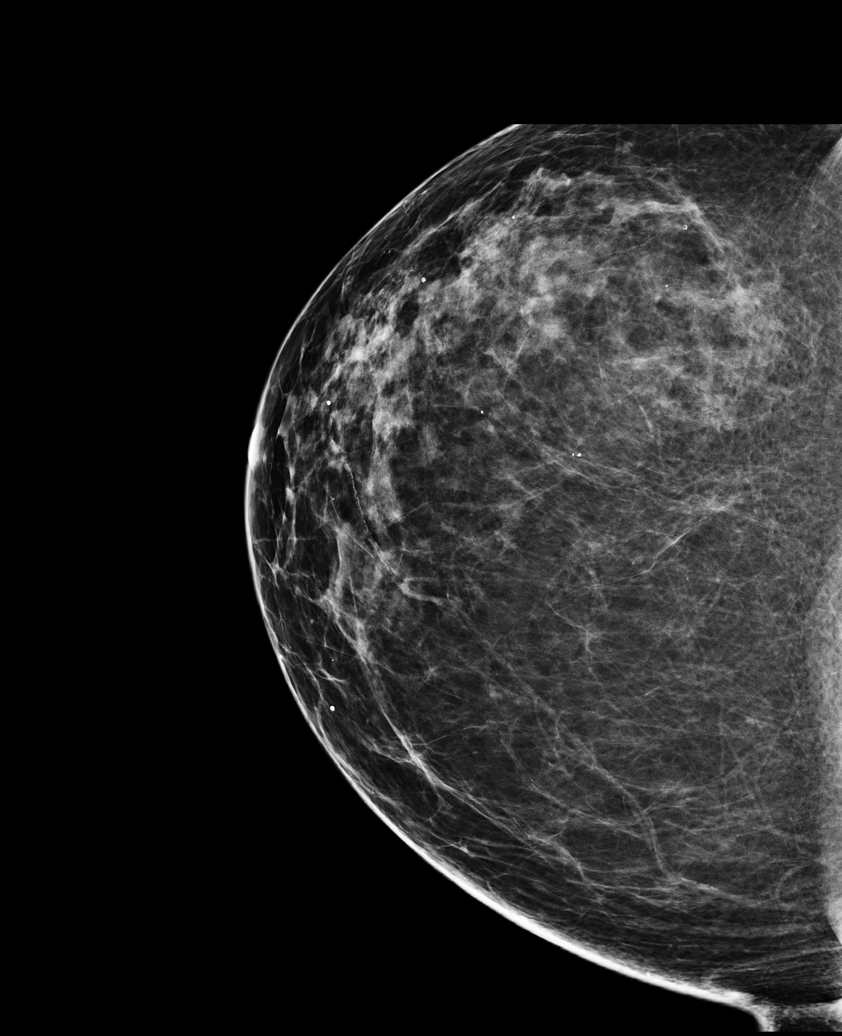

[L CC]
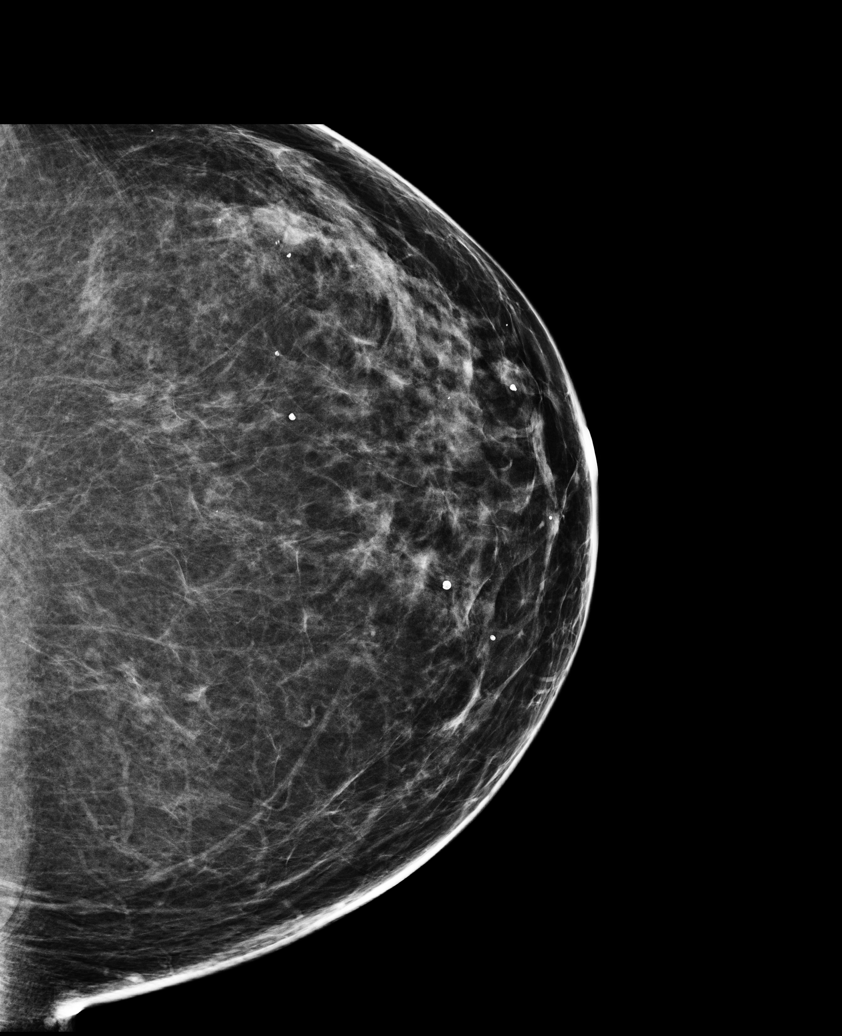

[L MLO (2 of 2)]
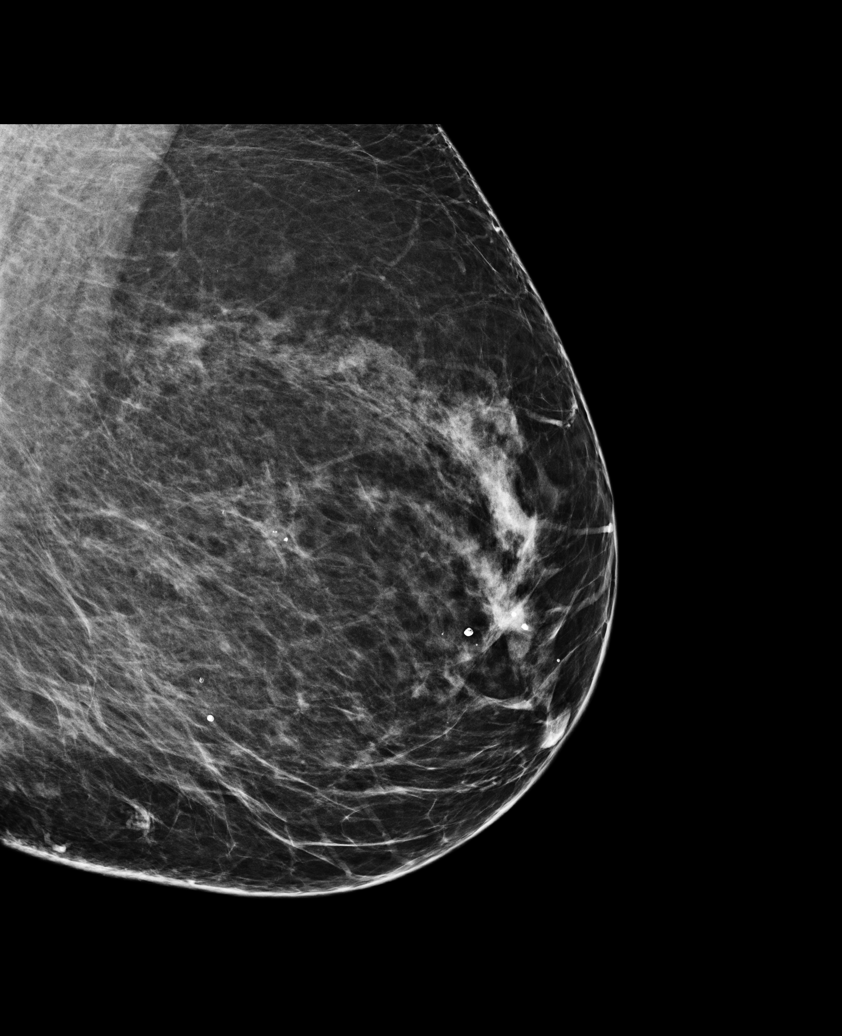

[6 of 26 positions shown; findings below may reference images not displayed]

ACR Breast Density Category b: There are scattered areas of
fibroglandular density.
FINDINGS: There are no findings suspicious for malignancy. Images were
processed with CAD.
IMPRESSION: No mammographic evidence of malignancy. A result letter of this
screening mammogram will be mailed directly to the patient.

RECOMMENDATION:
Screening mammogram in one year. (Code:[id number])

BI-RADS CATEGORY  1: Negative

## 2014-12-04 MED ORDER — PREDNISONE 20 MG PO TABS: ORAL_TABLET | ORAL | Status: DC

## 2014-12-04 NOTE — Telephone Encounter (Signed)
Pt states she feels about the same and is frustrated because she feels as if she is not getting anybetter. Prednisone called in for pt and pt advised to f/u in 10 days or sooner if needed.

## 2015-01-18 ENCOUNTER — Telehealth: Payer: Self-pay | Admitting: Family Medicine

## 2015-01-18 NOTE — Telephone Encounter (Signed)
Pt said she has had some itching and hair loss .She said this started after taking the following meds predniSONE (DELTASONE) 20 MG tablet  amoxicillin-clavulanate (AUGMENTIN) 875-125 MG per tablet  She said she also gain 8 lbs  . Pt said she would like to know how long it takes to clear her system. She would like a call back

## 2015-01-18 NOTE — Telephone Encounter (Signed)
See below

## 2015-01-18 NOTE — Telephone Encounter (Signed)
Patient took those in April. Should not still be in system.

## 2015-01-19 NOTE — Telephone Encounter (Signed)
Called and left message on pt vm. Advised her tcb and schedule appt if further questions

## 2015-03-13 ENCOUNTER — Telehealth: Payer: Self-pay | Admitting: Family Medicine

## 2015-03-13 DIAGNOSIS — R42 Dizziness and giddiness: Secondary | ICD-10-CM | POA: Diagnosis not present

## 2015-03-13 DIAGNOSIS — R51 Headache: Secondary | ICD-10-CM | POA: Diagnosis not present

## 2015-03-13 DIAGNOSIS — R110 Nausea: Secondary | ICD-10-CM | POA: Diagnosis not present

## 2015-03-13 NOTE — Telephone Encounter (Signed)
See below

## 2015-03-13 NOTE — Telephone Encounter (Signed)
I agree urgent care or ED visit is warranted- advise she gets assist from someone to take her there

## 2015-03-13 NOTE — Telephone Encounter (Signed)
Shelly Johnson [facility] called to have someone fu w/ pt for heat exhaustion. Pt would not give a definite answer if she will go to ED.

## 2015-03-13 NOTE — Telephone Encounter (Signed)
Left message for patient to return call.

## 2015-03-13 NOTE — Telephone Encounter (Signed)
Patient Name: Shelly Johnson  DOB: 09-29-1945    Initial Comment Caller states her air conditioning went out Sunday, felt nausea, dizziness, and vomited, concerned about heat stroke, still having headache and feeling off balance   Nurse Assessment  Nurse: Julien Girt, RN, Almyra Free Date/Time Eilene Ghazi Time): 03/13/2015 10:08:43 AM  Confirm and document reason for call. If symptomatic, describe symptoms. ---Caller states her air conditioning went out Sunday for 24+ hrs. Sunday night she became overheated, felt nauseated, vomited, had a headache, became very dizzy and got in a cold shower. States after the shower she still felt nauseated, no further vomiting but she was very lightheaded , having to hold on to furniture and doors to get through her home. On Monday she was still very weak , had the headache and "felt out of it" . Confirms she did not think to drink more fluids than normal. Monday afternoon the A/C was back on, but did not get cool until 5-6 pm. By bedtime she felt better but when she laid down for bed she became lightheaded and nauseated again, She was able to sleep through the night. This morning she still has a headache and is still "unsteady at times" because she is lightheaded.  Has the patient traveled out of the country within the last 30 days? ---Not Applicable  Does the patient require triage? ---Yes  Related visit to physician within the last 2 weeks? ---No  Does the PT have any chronic conditions? (i.e. diabetes, asthma, etc.) ---Yes  List chronic conditions. ---Anemic, GERD, Seasonal allergies     Guidelines    Guideline Title Affirmed Question Affirmed Notes  Heat Exposure (Heat Exhaustion and Heat Stroke) [1] Dizziness or weakness AND [2] persists after 2 hours of rest and drinking liquids    Final Disposition User   Go to ED Now (or PCP triage) Julien Girt, RN, Almyra Free    Comments  Caller informs me at end of call that she is on [facility] in a condo alone. She verbalized  understanding of all instructions but would not "promise" me she would seek ED care, which I strongly recommended. Confirms she does know others there and will be able to get transportation to the ED.   Referrals  GO TO FACILITY UNDECIDED   Disagree/Comply: Disagree  Disagree/Comply Reason: Wait and see

## 2015-04-02 ENCOUNTER — Telehealth: Payer: Self-pay | Admitting: Family Medicine

## 2015-04-02 ENCOUNTER — Encounter: Payer: Self-pay | Admitting: *Deleted

## 2015-04-02 NOTE — Telephone Encounter (Signed)
[facility] Day - Client [place] Call Center Patient Name: Shelly Johnson DOB: 1945/10/27 Initial Comment Caller states, she has a pain in her buttock, Rt side, going down her leg. It gets worse if she moves a certain way. Her whole leg is tingling and swollen. This has been there a week Nurse Assessment Nurse: Donalynn Furlong, RN, Myna Hidalgo Date/Time Eilene Ghazi Time): 04/02/2015 10:30:09 AM Confirm and document reason for call. If symptomatic, describe symptoms. ---Caller states, she has a pain in her buttock, Rt side, going down her leg. It gets worse if she moves a certain way. Her whole leg is tingling and slightly swollen.( This has been there a week) No reddened areas. Negative Homan's bilat via phone.No SOB. No c/o cardiac S/S Pt states she thinks it might be to "overworking that area as well". Pt has been helping remodeling her house and been doing things "I should not be doing". Pt states it feels liked she pulled something "in my butt" Has the patient traveled out of the country within the last 30 days? ---No Does the patient require triage? ---Yes Related visit to physician within the last 2 weeks? ---Yes Does the PT have any chronic conditions? (i.e. diabetes, asthma, etc.) ---No Guidelines Guideline Title Affirmed Question Affirmed Notes Leg Pain [1] Thigh, calf, or ankle swelling AND [2] only 1 side Final Disposition User See Physician within 4 Hours (or PCP triage) Donalynn Furlong, RN, Myna Hidalgo Comments Although 4 hr " to be seen" advised, (pt had initially agreed to it, then decided after the Guidelines were finished she didn't want to go) pt refused. Pt wanting to be seen tomorrow morning, has appt w Dr Elease Hashimoto, 8/16 at 11:45 . Refused ED/UC visit today. "I'm sick of this hospital doctor crap, I think its just a pulled leg muscle". Pt taking 81 mg ASA now for muscle " aches" Referrals REFERRED TO PCP  OFFICE Disagree/Comply: Comply

## 2015-04-02 NOTE — Telephone Encounter (Signed)
Pt notified and aware to be here for a 9:45 apt tomorrow morn.

## 2015-04-02 NOTE — Telephone Encounter (Signed)
Ok for pt to see Shelly Johnson or do you want to see her?

## 2015-04-02 NOTE — Telephone Encounter (Signed)
FYI: Patient has appointment to see [facility] tomorrow

## 2015-04-02 NOTE — Telephone Encounter (Signed)
Could double book her 9:45 Am with me if she is willing

## 2015-04-03 ENCOUNTER — Ambulatory Visit: Payer: Self-pay | Admitting: Family Medicine

## 2015-04-03 ENCOUNTER — Encounter: Payer: Self-pay | Admitting: Family Medicine

## 2015-04-03 ENCOUNTER — Ambulatory Visit (INDEPENDENT_AMBULATORY_CARE_PROVIDER_SITE_OTHER): Payer: Medicare Other | Admitting: Family Medicine

## 2015-04-03 VITALS — BP 122/78 | HR 79 | Temp 98.2°F | Wt 210.0 lb

## 2015-04-03 DIAGNOSIS — M5441 Lumbago with sciatica, right side: Secondary | ICD-10-CM

## 2015-04-03 DIAGNOSIS — Z23 Encounter for immunization: Secondary | ICD-10-CM

## 2015-04-03 NOTE — Progress Notes (Signed)
Garret Reddish, MD  Subjective:  Shelly Johnson is a 69 y.o. year old very pleasant female patient who presents with:  Right low back pain and right leg pain -as she was getting off the table from ER in [facility] in late July for heat exhaustion, noted right buttocks pain, thought she pulled something. Has pain radiating down to her toes. Pain up to 9/10 in her low back/buttocks. DOwn into leg is a -7/10. Constant Pain. Tingling into legs and numb feeling. No weakness in leg. Feels like stabbing sensation in butt. Gradually getting worse. Feels like leg is mildly swollen. Took some aleve and that has helped- had not taken medicine other than aleve. Certain movements but not predictable causes pain. Episodes last about 5 seconds in back, leg pain stays all the time  ROS-No saddle anesthesia, bladder incontinence, fecal incontinence, weakness in extremity.  History negative for trauma, history of cancer, fever, chills, unintentional weight loss, recent bacterial infection, recent IV drug use, HIV, pain worse at night or while supine.   Past Medical History- History of GI bleed- do not believe was on PPI at time, no cause found, anxiety, hiatal hernia  Medications- reviewed and updated Current Outpatient Prescriptions  Medication Sig Dispense Refill  . aspirin 81 MG tablet Take 81 mg by mouth daily. Pt takes 3 times a week    . Cyanocobalamin (B-12) 1000 MCG SUBL Place 1 tablet under the tongue daily.    . ferrous sulfate 325 (65 FE) MG tablet Take 1 tablet (325 mg total) by mouth 2 (two) times daily. 60 tablet 3  . MAGNESIUM CITRATE PO Take 1 tablet by mouth 6 (six) times daily. Pt takes for diet    . naproxen sodium (ALEVE) 220 MG tablet Take 220 mg by mouth as needed.     Marland Kitchen omeprazole (PRILOSEC) 40 MG capsule Take 1 capsule (40 mg total) by mouth daily. 30 capsule 3  . vitamin C (ASCORBIC ACID) 500 MG tablet Take 500 mg by mouth daily.      . Wheat Dextrin (BENEFIBER PO) Take by mouth daily. Pt takes 1  Tablespoon of Benefiber every day for diet     No current facility-administered medications for this visit.    Objective: BP 122/78 mmHg  Pulse 79  Temp(Src) 98.2 F (36.8 C)  Wt 210 lb (95.255 kg) Gen: NAD, resting comfortably CV: RRR no murmurs rubs or gallops Lungs: CTAB no crackles, wheeze, rhonchi Abdomen: soft/nontender/nondistended/normal bowel sounds. No rebound or guarding.  Ext: trace edema as below Skin: warm, dry, no rash Neuro: 5/5 strength lower extremity, normal reflexes, normal sensation. no saddle anesthesia.   10 cm below tibial plateau 39.5 cm both sides, trace edema bilateral. No swelling in right compared to left leg.   Back - Normal skin, Spine with normal alignment and no deformity.  No tenderness to vertebral process palpation.  Paraspinous muscles are not tender and without spasm.   Range of motion is full at neck. Pain with lumbar flexion. Negative Straight leg raise. FABER with recurrent pain near SI joint, no worsening sciatica. Most severe pain is with palpation over SI joint. Positional pain with various movements.   Assessment/Plan:  Right-sided low back pain with right-sided sciatica Told patient my preference was orthopedics given tingling in right leg worsening over 3 weeks. Fortunately no weakness. She would prefer conservative measures. Pain is most severe over SI joint on right and worse with FABER so this could be SI joint. We will treat with  aleve 1 tab BID x 1 week. Considered prednisone but patient adamant against this due to 10 lb weight gain on last course. We discussed clear high risk on this with history of GI bleed. She is to take PPI and hold aspirin while on this (in fact could consider stopping aspirin altogether given history GI bleed). She will update [place] in 1 week and we will refer to orthopedics if not at least 50% better- but would not continue nsaids past a week.  Return precautions advised.   Orders Placed This Encounter   Procedures  . Pneumococcal conjugate vaccine 13-valent    High complexity medical decision making due to history GI bleed

## 2015-04-03 NOTE — Patient Instructions (Signed)
Medication Instructions:  Take 1 aleve twice a day for 1 week  Other Instructions:  Ice area that is painful in the back for 15 minutes 3x a day, change to heat after 3 days  I think this could be SI joint pain, but the tingling in the legs makes me think you have nerve irritation. We discussed ortho referral, but you wanted to trial conservative measures first. If you are not at least 50% better when you call next week, we will refer you to orthopedics  Follow-Up (all visit scheduling, rescheduling, cancellations including labs should be scheduled at front desk): Call us in 1 week to update [place]- we will make plan from there

## 2015-04-12 ENCOUNTER — Telehealth: Payer: Self-pay

## 2015-04-12 DIAGNOSIS — M25569 Pain in unspecified knee: Secondary | ICD-10-CM

## 2015-04-12 NOTE — Telephone Encounter (Signed)
Yes ok to refer.  

## 2015-04-12 NOTE — Telephone Encounter (Signed)
The pt is hoping to get an ortho referral due to her leg pain increasing and moving towards her back. She is hoping to go to Dr. Adriana Mccallum in [place]   Pt's callback - [phone]

## 2015-04-12 NOTE — Telephone Encounter (Signed)
Referral entered  

## 2015-04-12 NOTE — Telephone Encounter (Signed)
Ok to refill 

## 2015-05-01 ENCOUNTER — Telehealth: Payer: Self-pay | Admitting: Family Medicine

## 2015-05-01 DIAGNOSIS — M2550 Pain in unspecified joint: Secondary | ICD-10-CM

## 2015-05-01 NOTE — Telephone Encounter (Signed)
I have no specific preference- whoever her insurance covers that she is willing to use. You can have her check with her insurance or just have [name] process it

## 2015-05-01 NOTE — Telephone Encounter (Signed)
Referral entered for Deb to process.

## 2015-05-01 NOTE — Telephone Encounter (Signed)
Is there any other rheumatologist you prefer to try?

## 2015-05-01 NOTE — Telephone Encounter (Signed)
Pt was referred to dr syed rheumatologist at [facility] and can not make an appt. Pt does not want to see anyone in that group. Pt would like a referral to another rheumatologist

## 2015-05-07 DIAGNOSIS — M5442 Lumbago with sciatica, left side: Secondary | ICD-10-CM | POA: Diagnosis not present

## 2015-05-07 DIAGNOSIS — M5441 Lumbago with sciatica, right side: Secondary | ICD-10-CM | POA: Diagnosis not present

## 2015-05-07 NOTE — Telephone Encounter (Signed)
Faxed referral and ov notes to Dr. Leigh Aurora  [address] [place], [place] [address]  [phone] Fax [phone] their office will contact pt to schedule directly

## 2015-05-08 DIAGNOSIS — M533 Sacrococcygeal disorders, not elsewhere classified: Secondary | ICD-10-CM | POA: Diagnosis not present

## 2015-05-08 DIAGNOSIS — M5431 Sciatica, right side: Secondary | ICD-10-CM | POA: Diagnosis not present

## 2015-05-08 DIAGNOSIS — M545 Low back pain: Secondary | ICD-10-CM | POA: Diagnosis not present

## 2015-05-08 DIAGNOSIS — D508 Other iron deficiency anemias: Secondary | ICD-10-CM | POA: Diagnosis not present

## 2015-05-08 DIAGNOSIS — M47896 Other spondylosis, lumbar region: Secondary | ICD-10-CM | POA: Diagnosis not present

## 2015-05-08 DIAGNOSIS — M791 Myalgia: Secondary | ICD-10-CM | POA: Diagnosis not present

## 2015-05-30 DIAGNOSIS — M79604 Pain in right leg: Secondary | ICD-10-CM | POA: Diagnosis not present

## 2015-05-30 DIAGNOSIS — M545 Low back pain: Secondary | ICD-10-CM | POA: Diagnosis not present

## 2015-05-30 DIAGNOSIS — M47896 Other spondylosis, lumbar region: Secondary | ICD-10-CM | POA: Diagnosis not present

## 2015-05-30 DIAGNOSIS — M150 Primary generalized (osteo)arthritis: Secondary | ICD-10-CM | POA: Diagnosis not present

## 2015-05-30 DIAGNOSIS — M5431 Sciatica, right side: Secondary | ICD-10-CM | POA: Diagnosis not present

## 2015-08-30 ENCOUNTER — Other Ambulatory Visit: Payer: Self-pay

## 2015-08-30 DIAGNOSIS — Z1231 Encounter for screening mammogram for malignant neoplasm of breast: Secondary | ICD-10-CM

## 2015-12-18 DIAGNOSIS — H2513 Age-related nuclear cataract, bilateral: Secondary | ICD-10-CM | POA: Diagnosis not present

## 2015-12-18 DIAGNOSIS — H1789 Other corneal scars and opacities: Secondary | ICD-10-CM | POA: Diagnosis not present

## 2015-12-18 DIAGNOSIS — H52203 Unspecified astigmatism, bilateral: Secondary | ICD-10-CM | POA: Diagnosis not present

## 2015-12-25 ENCOUNTER — Ambulatory Visit: Payer: Medicare Other

## 2015-12-27 ENCOUNTER — Telehealth: Payer: Self-pay | Admitting: Family Medicine

## 2015-12-27 NOTE — Telephone Encounter (Signed)
FYI

## 2015-12-27 NOTE — Telephone Encounter (Signed)
[facility] - Client [place] Call Center Patient Name: Shelly Johnson DOB: 11-08-1945 Initial Comment Caller states has cough, sore throat, and congestion Nurse Assessment Nurse: Dimas Chyle, RN, Dellis Filbert Date/Time Eilene Ghazi Time): 12/27/2015 10:15:14 AM Confirm and document reason for call. If symptomatic, describe symptoms. You must click the next button to save text entered. ---Caller states has cough, sore throat, and congestion. Symptoms started last week. No fever. Has the patient traveled out of the country within the last 30 days? ---No Does the patient have any new or worsening symptoms? ---Yes Will a triage be completed? ---Yes Related visit to physician within the last 2 weeks? ---No Does the PT have any chronic conditions? (i.e. diabetes, asthma, etc.) ---No Is this a behavioral health or substance abuse call? ---No Guidelines Guideline Title Affirmed Question Affirmed Notes Cough - Acute Productive Cough (all triage questions negative) Final Disposition User [place], RN, Dellis Filbert Disagree/Comply: Comply

## 2016-01-09 ENCOUNTER — Ambulatory Visit
Admission: RE | Admit: 2016-01-09 | Discharge: 2016-01-09 | Disposition: A | Discharge status: Home / Self Care | Payer: Medicare Other | Source: Ambulatory Visit

## 2016-01-09 DIAGNOSIS — Z1231 Encounter for screening mammogram for malignant neoplasm of breast: Secondary | ICD-10-CM | POA: Diagnosis not present

## 2016-03-12 ENCOUNTER — Ambulatory Visit (INDEPENDENT_AMBULATORY_CARE_PROVIDER_SITE_OTHER)
Admission: RE | Admit: 2016-03-12 | Discharge: 2016-03-12 | Disposition: A | Discharge status: Home / Self Care | Payer: Medicare Other | Source: Ambulatory Visit | Attending: Family Medicine | Admitting: Family Medicine

## 2016-03-12 ENCOUNTER — Ambulatory Visit (INDEPENDENT_AMBULATORY_CARE_PROVIDER_SITE_OTHER): Payer: Medicare Other | Admitting: Family Medicine

## 2016-03-12 ENCOUNTER — Encounter: Payer: Self-pay | Admitting: Family Medicine

## 2016-03-12 VITALS — BP 156/96 | HR 81 | Temp 98.0°F | Wt 215.0 lb

## 2016-03-12 DIAGNOSIS — R5383 Other fatigue: Secondary | ICD-10-CM | POA: Diagnosis not present

## 2016-03-12 DIAGNOSIS — R0602 Shortness of breath: Secondary | ICD-10-CM

## 2016-03-12 DIAGNOSIS — R0609 Other forms of dyspnea: Secondary | ICD-10-CM | POA: Diagnosis not present

## 2016-03-12 LAB — CBC WITH DIFFERENTIAL/PLATELET
BASOS PCT: 0.7 % (ref 0.0–3.0)
Basophils Absolute: 0 10*3/uL (ref 0.0–0.1)
EOS ABS: 0.1 10*3/uL (ref 0.0–0.7)
EOS PCT: 1.6 % (ref 0.0–5.0)
HEMATOCRIT: 41.5 % (ref 36.0–46.0)
HEMOGLOBIN: 13.4 g/dL (ref 12.0–15.0)
LYMPHS PCT: 29.5 % (ref 12.0–46.0)
Lymphs Abs: 1.4 10*3/uL (ref 0.7–4.0)
MCHC: 32.4 g/dL (ref 30.0–36.0)
MCV: 89.7 fl (ref 78.0–100.0)
MONO ABS: 0.3 10*3/uL (ref 0.1–1.0)
Monocytes Relative: 5.9 % (ref 3.0–12.0)
Neutro Abs: 3 10*3/uL (ref 1.4–7.7)
Neutrophils Relative %: 62.3 % (ref 43.0–77.0)
PLATELETS: 216 10*3/uL (ref 150.0–400.0)
RBC: 4.62 Mil/uL (ref 3.87–5.11)
RDW: 16.4 % — AB (ref 11.5–15.5)
WBC: 4.8 10*3/uL (ref 4.0–10.5)

## 2016-03-12 NOTE — Progress Notes (Signed)
Pre visit review using our clinic review tool, if applicable. No additional management support is needed unless otherwise documented below in the visit note. 

## 2016-03-12 NOTE — Patient Instructions (Signed)
EKG reassuring  Suspect this is all stress related but need to be sure  Labs before you go  If d-dimer is positive- would call to have you go to emergency room. My suspicion for blood clot is low though  Get chest x-ray toay  If above workup negative- send for stress echocardiogram

## 2016-03-12 NOTE — Progress Notes (Signed)
Subjective:  Shelly Johnson is a 70 y.o. year old very pleasant female patient who presents for/with See problem oriented charting ROS- see any ROS included in HPI as well.   Past Medical History-  Patient Active Problem List   Diagnosis Date Noted  . ANEMIA-NOS 03/16/2008    Priority: Medium  . ANXIETY DEPRESSION 03/08/2008    Priority: Medium  . HIATAL HERNIA WITH REFLUX 03/08/2008    Priority: Low    Medications- reviewed and updated Current Outpatient Prescriptions  Medication Sig Dispense Refill  . aspirin 81 MG tablet Take 81 mg by mouth daily. Pt takes 3 times a week    . Cyanocobalamin (B-12) 1000 MCG SUBL Place 1 tablet under the tongue daily.    . ferrous sulfate 325 (65 FE) MG tablet Take 1 tablet (325 mg total) by mouth 2 (two) times daily. 60 tablet 3  . MAGNESIUM CITRATE PO Take 1 tablet by mouth 6 (six) times daily. Pt takes for diet    . naproxen sodium (ALEVE) 220 MG tablet Take 220 mg by mouth as needed.     Marland Kitchen omeprazole (PRILOSEC) 40 MG capsule Take 1 capsule (40 mg total) by mouth daily. 30 capsule 3  . vitamin C (ASCORBIC ACID) 500 MG tablet Take 500 mg by mouth daily.      . Wheat Dextrin (BENEFIBER PO) Take by mouth daily. Pt takes 1 Tablespoon of Benefiber every day for diet     No current facility-administered medications for this visit.     Objective: BP (!) 156/96 (BP Location: Left Arm, Cuff Size: Large)   Pulse 81   Temp 98 F (36.7 C) (Oral)   Wt 215 lb (97.5 kg)   SpO2 97%   BMI 39.97 kg/m  Gen: NAD, resting comfortably Oropharynx normal, Mucous membranes are moist. CV: RRR no murmurs rubs or gallops No chest wall pain Lungs: CTAB no crackles, wheeze, rhonchi Abdomen: soft/nontender/nondistended/normal bowel sounds.   Ext: no edema Skin: warm, dry Neuro: grossly normal, moves all extremities  EKG: NSR rate 73, normal axis, normal intervals, no hypertrophy no st or t wave changes.   Assessment/Plan:  Chest heaviness Shortness of  breath Fatigue S: 3-4 weeks of symptoms. Started first with chest heaviness- tries to state this si not pain. Symptoms 4-5x a day lasts a few minutes. Could just be sitting in place when this happens. Mild stomach bloating. Feels shortness of breath when having the chest heaviness. Feels like she is huffing and puffing going to mailbox. Fatigue after minimal activity wants to lay down and sleep.  itchy diffuse. States she has felt somewhat like this in the past when anemic. 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 Dealing with stress with people over 3-4 weeks. Feels overwhelmed.   ROS- also has pain starting in buttocks going down the leg down into the right leg. Swollen on that side compared to the other mildly. Tender to touch. Has some mild numbness into right leg, hands at times. No obvious bleeding source. No nausea, vomiting, diaphoresis  A/P: 70 year old female with very mild hyperlipidemia untreated, no history of HTN but elevated today presents with 3-4 weeks of severe fatigue, shortness of breath with exertion, chest heaviness that is at times exertional and at times at rest. Reassuring EKG but cannot rule out cardiac cause. Advised Stress echo  But she agrees to this only if  below workup normal. Will hold off on asa, statin for now as we get above workup done.  Will get labs as below including D- dimer. She has had right leg pain but there is no calf tenderness today and calf size equal. Doubt DVT/PE and d-dimer if negative will give further reassurance. Get CXR. Anemia high on differential as has felt similar in past when anemic- may need repeat GI workup. Continue her iron. STRICT return precautions advised especially new or worsening symptoms  Anxiety/stress also high on list and highest on patient's list- so much so that she did not really want to do workup but I encouraged her. In regards to right  buttocks pain- suspect disc issue but intermittent and mild so will watch for now only  Orders Placed This Encounter  Procedures  . DG Chest 2 View    Standing Status:   Future    Standing Expiration Date:   05/13/2017    Order Specific Question:   Reason for Exam (SYMPTOM  OR DIAGNOSIS REQUIRED)    Answer:   DOE, SOB    Order Specific Question:   Preferred imaging location?    Answer:   [facility]  . D-dimer, Quantitative  . CBC with Differential/Platelet  . Comprehensive metabolic panel    [facility]  . TSH    [place]  . EKG 12-Lead    Order Specific Question:   Where should this test be performed    Answer:   Other    The duration of face-to-face time during this visit was 30 minutes. Greater than 50% of this time was spent in counseling, explanation of diagnosis, planning of further management, and/or coordination of care.    Garret Reddish, MD

## 2016-03-13 LAB — D-DIMER, QUANTITATIVE (NOT AT ARMC): D DIMER QUANT: 0.27 ug{FEU}/mL (ref <0.50)

## 2016-03-14 LAB — COMPREHENSIVE METABOLIC PANEL
ALBUMIN: 4.1 g/dL (ref 3.5–5.2)
ALK PHOS: 62 U/L (ref 39–117)
ALT: 27 U/L (ref 0–35)
AST: 18 U/L (ref 0–37)
BUN: 13 mg/dL (ref 6–23)
CALCIUM: 9.1 mg/dL (ref 8.4–10.5)
CHLORIDE: 105 meq/L (ref 96–112)
CO2: 28 mEq/L (ref 19–32)
Creatinine, Ser: 0.82 mg/dL (ref 0.40–1.20)
GFR: 73.65 mL/min (ref >60.00)
Glucose, Bld: 90 mg/dL (ref 70–99)
POTASSIUM: 4 meq/L (ref 3.5–5.1)
Sodium: 139 mEq/L (ref 135–145)
TOTAL PROTEIN: 6.6 g/dL (ref 6.0–8.3)
Total Bilirubin: 0.3 mg/dL (ref 0.2–1.2)

## 2016-03-14 LAB — TSH: TSH: 3.36 u[IU]/mL (ref 0.35–4.50)

## 2016-03-14 NOTE — Addendum Note (Signed)
Addended by: Elmer Picker on: 03/14/2016 02:22 PM   Modules accepted: Orders

## 2016-03-18 ENCOUNTER — Telehealth: Payer: Self-pay | Admitting: Family Medicine

## 2016-03-18 NOTE — Telephone Encounter (Signed)
Pt call to ask her results of her lab she had last week .

## 2016-03-18 NOTE — Telephone Encounter (Signed)
Spoke with patient and reviewed over lab results. Patient to call with any questions or concerns.

## 2016-05-12 ENCOUNTER — Telehealth: Payer: Self-pay | Admitting: Family Medicine

## 2016-05-12 ENCOUNTER — Other Ambulatory Visit: Payer: Self-pay

## 2016-05-12 DIAGNOSIS — M25569 Pain in unspecified knee: Secondary | ICD-10-CM

## 2016-05-12 NOTE — Telephone Encounter (Signed)
Referral made as requested.

## 2016-05-12 NOTE — Telephone Encounter (Signed)
Pt states her leg is not better, getting worse. Would like A referral to an orthopedic. Pt states she would like to see Dr Alvan Dame if that is his speciality.

## 2016-07-03 DIAGNOSIS — M5136 Other intervertebral disc degeneration, lumbar region: Secondary | ICD-10-CM | POA: Diagnosis not present

## 2016-07-03 DIAGNOSIS — M5442 Lumbago with sciatica, left side: Secondary | ICD-10-CM | POA: Diagnosis not present

## 2016-07-03 DIAGNOSIS — M5030 Other cervical disc degeneration, unspecified cervical region: Secondary | ICD-10-CM | POA: Diagnosis not present

## 2016-07-03 DIAGNOSIS — M5441 Lumbago with sciatica, right side: Secondary | ICD-10-CM | POA: Diagnosis not present

## 2016-07-04 ENCOUNTER — Encounter: Payer: Self-pay | Admitting: Family Medicine

## 2016-07-04 ENCOUNTER — Ambulatory Visit (INDEPENDENT_AMBULATORY_CARE_PROVIDER_SITE_OTHER): Payer: Medicare Other | Admitting: Family Medicine

## 2016-07-04 VITALS — BP 144/102 | HR 92 | Temp 98.1°F | Wt 216.4 lb

## 2016-07-04 DIAGNOSIS — R0600 Dyspnea, unspecified: Secondary | ICD-10-CM

## 2016-07-04 DIAGNOSIS — R0609 Other forms of dyspnea: Secondary | ICD-10-CM | POA: Diagnosis not present

## 2016-07-04 DIAGNOSIS — Z23 Encounter for immunization: Secondary | ICD-10-CM

## 2016-07-04 NOTE — Progress Notes (Signed)
Subjective:  Shelly Johnson is a 70 y.o. year old very pleasant female patient who presents for/with See problem oriented charting ROS- ROS section below S/HPI section below  Past Medical History-  Patient Active Problem List   Diagnosis Date Noted  . ANEMIA-NOS 03/16/2008    Priority: Medium  . ANXIETY DEPRESSION 03/08/2008    Priority: Medium  . HIATAL HERNIA WITH REFLUX 03/08/2008    Priority: Low    Medications- reviewed and updated Current Outpatient Prescriptions  Medication Sig Dispense Refill  . aspirin 81 MG tablet Take 81 mg by mouth daily. Pt takes 3 times a week    . ferrous sulfate 325 (65 FE) MG tablet Take 1 tablet (325 mg total) by mouth 2 (two) times daily. 60 tablet 3  . naproxen sodium (ALEVE) 220 MG tablet Take 220 mg by mouth as needed.     Marland Kitchen omeprazole (PRILOSEC) 40 MG capsule Take 1 capsule (40 mg total) by mouth daily. 30 capsule 3  . vitamin C (ASCORBIC ACID) 500 MG tablet Take 500 mg by mouth daily.       No current facility-administered medications for this visit.    Objective: BP (!) 144/102 (BP Location: Left Arm, Patient Position: Sitting, Cuff Size: Large)   Pulse 92   Temp 98.1 F (36.7 C) (Oral)   Wt 216 lb 6.4 oz (98.2 kg)   SpO2 94%   BMI 40.23 kg/m  Gen: NAD, resting comfortably Oropharynx normal, Mucous membranes are moist. CV: RRR no murmurs rubs or gallops No chest wall pain Lungs: CTAB no crackles, wheeze, rhonchi Abdomen: soft/nontender/nondistended/normal bowel sounds.  Morbidly obese Ext: no edema Skin: warm, dry  Assessment/Plan:  Chest heaviness Shortness of breath Fatigue Elevated blood pressure S: Patient now has had 4-5 months of symptoms. Primary complaint is fatigue and shortness of breath. Just wants to sit and watch tv. Tries to do something and she is short of breath or fatigued. Hard to get motivated- no get up and go due to SOB/fatigue.  Often she is just resting when this occurs but can also happen with activity.  She has done a home exercise regimen with a video that works up a sweat and states has no chest pain or shortness of breath with that. Really has a hard time teasing out whether she is winded or tired. Sometimes she feels like she does the least bit of activity and just wants to sleep and feels short of breath- but as noted above can do exercise regimen and does not feel that way.  She has only had one episode of chest heaviness with walking since last visit- prior could be multiple times a day.    History of anemia but CBC has been normal within this timeframe. She does still relate back to husbands death in 17-Nov-2006 and feels like she has been more stressed had more issues each year since then.   Of note BP has been elevated in time frame that she states she has not felt as well and was normal previously.she does not want to start medication today BP Readings from Last 3 Encounters:  07/04/16 (!) 144/102  03/12/16 (!) 156/96  04/03/15 122/78   ROS- continues to have back and neck pain on right side- saw orthopedics and was told arthritis related. No diaphoresis, left arm or neck pain.  No obvious bleeding source. No nausea, vomiting.   A/P: 70 year old female with very mild hyperlipidemia untreated, now elevated BP on 2 visits presents  with continued fatigue, as well as intermittent shortness of breath and now only rare chest heaviness. She had declined stress echo previously but agrees to this today. She started aspirin 3x a week. Not on statin. Prior workup included no anemia on CBC, negative D-dimer. CXR largley normal except some fibrosis- considering follow up CT if stress echo reassuring. Once again- STRICT return precautions advised especially new or worsening symptoms  I do not suspect BP is causing her fatigue but I am concerned about trend- advised consideration of HCTZ 25mg  with 2 week follow up. She would rather do home monitoring and return to see [place] - will go buy cuff to make sure  hypertensive at home as well as in office before starting meds. Also DASH handout given.   She is not nodding off in daytime but with her obesity would also consider sleep apnea testing  Anxiety/stress also high on list and highest on patient's list- as noted before- patient has been reluctant to do workup but with continued issues agrees today.   I also suspect deconditioning- I wonder about the intensity of her home exercise regimen.   Orders Placed This Encounter  Procedures  . Flu vaccine HIGH DOSE PF  . Pneumococcal polysaccharide vaccine 23-valent greater than or equal to 2yo subcutaneous/IM  . ECHOCARDIOGRAM STRESS TEST    Standing Status:   Future    Standing Expiration Date:   10/04/2017    Order Specific Question:   Where should this be performed?    Answer:   [facility])    Order Specific Question:   Does the patient weigh less than or greater than 250 lbs?    Answer:   Patient weighs less than 250 lbs    Order Specific Question:   Complete or Limited study?    Answer:   Complete    Order Specific Question:   Does the patient have a known history of hypersensitivity to Perflutren (aka Scientist, research (medical) for echocardiograms - CHECK ALLERGIES)    Answer:   No    Order Specific Question:   ADMINISTER PERFLUTERN    Answer:   ADMINISTER PERFLUTREN    Order Specific Question:   Stress with Dobutamine or Treadmill with exercise?    Answer:   Dobutamine    Order Specific Question:   Expected Date:    Answer:   1 week    Order Specific Question:   Reason for exam-Echo    Answer:   Dyspnea  786.09 / R06.00    The duration of face-to-face time during this visit was greater than 25 minutes. Greater than 50% of this time was spent in counseling about potential causes, discussing her stressors and prior loss of husband   Garret Reddish, MD

## 2016-07-04 NOTE — Progress Notes (Signed)
Pre visit review using our clinic review tool, if applicable. No additional management support is needed unless otherwise documented below in the visit note. 

## 2016-07-04 NOTE — Patient Instructions (Addendum)
Flu and pneumovax today. Will be final pneumonia shot.   Stress echo- We will call you within a week about your referral. If you do not hear within 2 weeks, give [place] a call.   BP Readings from Last 3 Encounters:  07/04/16 (!) 144/102  03/12/16 (!) 156/96  04/03/15 122/78   Your blood pressure trend concerns me. I would like for you to buy/use a home cuff to check daily. Your goal is <135/85. See me back week after next and bring your cuff, log of blood pressures. Please schedule this before you leave  DASH Eating Plan DASH stands for "Dietary Approaches to Stop Hypertension." The DASH eating plan is a healthy eating plan that has been shown to reduce high blood pressure (hypertension). Additional health benefits may include reducing the risk of type 2 diabetes mellitus, heart disease, and stroke. The DASH eating plan may also help with weight loss. What do I need to know about the DASH eating plan? For the DASH eating plan, you will follow these general guidelines:  Choose foods with less than 150 milligrams of sodium per serving (as listed on the food label).  Use salt-free seasonings or herbs instead of table salt or sea salt.  Check with your health care provider or pharmacist before using salt substitutes.  Eat lower-sodium products. These are often labeled as "low-sodium" or "no salt added."  Eat fresh foods. Avoid eating a lot of canned foods.  Eat more vegetables, fruits, and low-fat dairy products.  Choose whole grains. Look for the word "whole" as the first word in the ingredient list.  Choose fish and skinless chicken or [place] more often than red meat. Limit fish, poultry, and meat to 6 oz (170 g) each day.  Limit sweets, desserts, sugars, and sugary drinks.  Choose heart-healthy fats.  Eat more home-cooked food and less restaurant, buffet, and fast food.  Limit fried foods.  Do not fry foods. Cook foods using methods such as baking, boiling, grilling, and broiling  instead.  When eating at a restaurant, ask that your food be prepared with less salt, or no salt if possible. What foods can I eat? Seek help from a dietitian for individual calorie needs. Grains  Whole grain or whole wheat bread. Brown rice. Whole grain or whole wheat pasta. Quinoa, bulgur, and whole grain cereals. Low-sodium cereals. Corn or whole wheat flour tortillas. Whole grain cornbread. Whole grain crackers. Low-sodium crackers. Vegetables  Fresh or frozen vegetables (raw, steamed, roasted, or grilled). Low-sodium or reduced-sodium tomato and vegetable juices. Low-sodium or reduced-sodium tomato sauce and paste. Low-sodium or reduced-sodium canned vegetables. Fruits  All fresh, canned (in natural juice), or frozen fruits. Meat and Other Protein Products  Ground beef (85% or leaner), grass-fed beef, or beef trimmed of fat. Skinless chicken or [place]. Ground chicken or [place]. Pork trimmed of fat. All fish and seafood. Eggs. Dried beans, peas, or lentils. Unsalted nuts and seeds. Unsalted canned beans. Dairy  Low-fat dairy products, such as skim or 1% milk, 2% or reduced-fat cheeses, low-fat ricotta or cottage cheese, or plain low-fat yogurt. Low-sodium or reduced-sodium cheeses. Fats and Oils  Tub margarines without trans fats. Light or reduced-fat mayonnaise and salad dressings (reduced sodium). Avocado. Safflower, olive, or canola oils. Natural peanut or almond butter. Other  Unsalted popcorn and pretzels. The items listed above may not be a complete list of recommended foods or beverages. Contact your dietitian for more options.  What foods are not recommended? Grains  White bread. White  pasta. White rice. Refined cornbread. Bagels and croissants. Crackers that contain trans fat. Vegetables  Creamed or fried vegetables. Vegetables in a cheese sauce. Regular canned vegetables. Regular canned tomato sauce and paste. Regular tomato and vegetable juices. Fruits  Canned fruit in light  or heavy syrup. Fruit juice. Meat and Other Protein Products  Fatty cuts of meat. Ribs, chicken wings, bacon, sausage, bologna, salami, chitterlings, fatback, hot dogs, bratwurst, and packaged luncheon meats. Salted nuts and seeds. Canned beans with salt. Dairy  Whole or 2% milk, cream, half-and-half, and cream cheese. Whole-fat or sweetened yogurt. Full-fat cheeses or blue cheese. Nondairy creamers and whipped toppings. Processed cheese, cheese spreads, or cheese curds. Condiments  Onion and garlic salt, seasoned salt, table salt, and sea salt. Canned and packaged gravies. Worcestershire sauce. Tartar sauce. Barbecue sauce. Teriyaki sauce. Soy sauce, including reduced sodium. Steak sauce. Fish sauce. Oyster sauce. Cocktail sauce. Horseradish. Ketchup and mustard. Meat flavorings and tenderizers. Bouillon cubes. Hot sauce. Tabasco sauce. Marinades. Taco seasonings. Relishes. Fats and Oils  Butter, stick margarine, lard, shortening, ghee, and bacon fat. Coconut, palm kernel, or palm oils. Regular salad dressings. Other  Pickles and olives. Salted popcorn and pretzels. The items listed above may not be a complete list of foods and beverages to avoid. Contact your dietitian for more information.  Where can I find more information? National Heart, Lung, and Blood Institute: travelstabloid.com This information is not intended to replace advice given to you by your health care provider. Make sure you discuss any questions you have with your health care provider. Document Released: 07/24/2011 Document Revised: 01/10/2016 Document Reviewed: 06/08/2013 Elsevier Interactive Patient Education  2017 [employer].

## 2016-07-08 ENCOUNTER — Telehealth: Payer: Self-pay | Admitting: Family Medicine

## 2016-07-08 NOTE — Telephone Encounter (Signed)
Pt is calling back and would like jamie to return her call concerning buying bp machine

## 2016-07-15 NOTE — Telephone Encounter (Signed)
Called and left a voicemail message asking for a return phone call 

## 2016-07-16 DIAGNOSIS — M5136 Other intervertebral disc degeneration, lumbar region: Secondary | ICD-10-CM | POA: Diagnosis not present

## 2016-07-16 DIAGNOSIS — M5030 Other cervical disc degeneration, unspecified cervical region: Secondary | ICD-10-CM | POA: Diagnosis not present

## 2016-07-16 NOTE — Telephone Encounter (Signed)
Pt is scheduled for a nurse visit Friday, July 18, 2016

## 2016-07-18 ENCOUNTER — Ambulatory Visit: Payer: Medicare Other

## 2016-07-18 VITALS — BP 180/101

## 2016-07-18 DIAGNOSIS — I10 Essential (primary) hypertension: Secondary | ICD-10-CM

## 2016-07-23 DIAGNOSIS — M5136 Other intervertebral disc degeneration, lumbar region: Secondary | ICD-10-CM | POA: Diagnosis not present

## 2016-07-23 DIAGNOSIS — M5030 Other cervical disc degeneration, unspecified cervical region: Secondary | ICD-10-CM | POA: Diagnosis not present

## 2016-07-28 ENCOUNTER — Telehealth: Payer: Self-pay | Admitting: Family Medicine

## 2016-07-28 NOTE — Telephone Encounter (Signed)
error 

## 2016-07-30 DIAGNOSIS — M5136 Other intervertebral disc degeneration, lumbar region: Secondary | ICD-10-CM | POA: Diagnosis not present

## 2016-07-30 DIAGNOSIS — M5030 Other cervical disc degeneration, unspecified cervical region: Secondary | ICD-10-CM | POA: Diagnosis not present

## 2016-07-31 ENCOUNTER — Telehealth: Payer: Self-pay | Admitting: Family Medicine

## 2016-07-31 MED ORDER — CHLORTHALIDONE 25 MG PO TABS: 25.0000 mg | ORAL_TABLET | Freq: Every day | ORAL | 2 refills | Status: DC

## 2016-07-31 NOTE — Telephone Encounter (Signed)
Blood pressure on home monitor far too high averaging at least 150/90. Start diuretic Chlorthalidone (i called in) and follow up 1st week of January (please help her set this up)

## 2016-08-04 ENCOUNTER — Telehealth: Payer: Self-pay | Admitting: Family Medicine

## 2016-08-04 NOTE — Telephone Encounter (Signed)
Called and spoke to patient. She stated she is supposed to go out of town on the 17th so she is thinking of calling to reschedule the Stress test. I did encourage her to get the stress test done ASAP.

## 2016-08-04 NOTE — Telephone Encounter (Signed)
Called to inform pt of her stress test that's scheduled for  09-03-2016@2 :30, pt wanted to know if Dr hunter received her blood pressure reading  she brought  in .pt would like a call to confirm

## 2016-08-04 NOTE — Telephone Encounter (Signed)
Spoke with patient who is scheduled for January 8 for follow up. Instructed her to pick up her medication at the pharmacy. She verbalized understanding.

## 2016-08-04 NOTE — Telephone Encounter (Signed)
Discussed with Roselyn Reef- call 07/31/16 addresses this- she will call

## 2016-08-22 ENCOUNTER — Encounter: Payer: Self-pay | Admitting: Family Medicine

## 2016-08-22 ENCOUNTER — Ambulatory Visit (INDEPENDENT_AMBULATORY_CARE_PROVIDER_SITE_OTHER): Payer: Medicare Other | Admitting: Family Medicine

## 2016-08-22 ENCOUNTER — Telehealth: Payer: Self-pay | Admitting: Family Medicine

## 2016-08-22 DIAGNOSIS — R0600 Dyspnea, unspecified: Secondary | ICD-10-CM

## 2016-08-22 DIAGNOSIS — I10 Essential (primary) hypertension: Secondary | ICD-10-CM | POA: Diagnosis not present

## 2016-08-22 DIAGNOSIS — R0609 Other forms of dyspnea: Secondary | ICD-10-CM | POA: Diagnosis not present

## 2016-08-22 MED ORDER — CHLORTHALIDONE 25 MG PO TABS: 25.0000 mg | ORAL_TABLET | Freq: Every day | ORAL | 3 refills | Status: DC

## 2016-08-22 NOTE — Telephone Encounter (Signed)
Can you pls resend the Rx of chlorthalidone 25 MG pt waiting at pharmacy

## 2016-08-22 NOTE — Progress Notes (Signed)
Subjective:  Shelly Johnson is a 72 y.o. year old very pleasant female patient who presents for/with See problem oriented charting ROS- has some fatigue, shortness of breath, rare chest pain. No fever, chills, cough.    Past Medical History-  Patient Active Problem List   Diagnosis Date Noted  . Essential hypertension 08/23/2016    Priority: Medium  . ANEMIA-NOS 03/16/2008    Priority: Medium  . ANXIETY DEPRESSION 03/08/2008    Priority: Medium  . HIATAL HERNIA WITH REFLUX 03/08/2008    Priority: Low  . Dyspnea on exertion 08/23/2016    Medications- reviewed and updated Current Outpatient Prescriptions  Medication Sig Dispense Refill  . aspirin 81 MG tablet Take 81 mg by mouth daily. Pt takes 3 times a week    . chlorthalidone (HYGROTON) 25 MG tablet Take 1 tablet (25 mg total) by mouth daily. 90 tablet 3  . ferrous sulfate 325 (65 FE) MG tablet Take 1 tablet (325 mg total) by mouth 2 (two) times daily. 60 tablet 3  . naproxen sodium (ALEVE) 220 MG tablet Take 220 mg by mouth as needed.     Marland Kitchen omeprazole (PRILOSEC) 40 MG capsule Take 1 capsule (40 mg total) by mouth daily. 30 capsule 3  . vitamin C (ASCORBIC ACID) 500 MG tablet Take 500 mg by mouth daily.       No current facility-administered medications for this visit.     Objective: BP (!) 144/88 (BP Location: Left Arm, Patient Position: Sitting, Cuff Size: Large)   Pulse 83   Temp 97.8 F (36.6 C) (Oral)   Ht 5' 1.5" (1.562 m)   Wt 217 lb 6.4 oz (98.6 kg)   SpO2 97%   BMI 40.41 kg/m  Gen: NAD, resting comfortably CV: RRR no murmurs rubs or gallops No ches twall pain Lungs: CTAB no crackles, wheeze, rhonchi Abdomen: soft/nontender/nondistended/normal bowel sounds. Morbid obesity Ext: no edema Skin: warm, dry  Assessment/Plan:  Essential hypertension S: controlled on chlorthalidone 25mg  per [facility]. Home #s look even better typically [id number] though diastolic does seem to at least be in the upper 80s.  Verified her  home cuff today (her cuff runs just a few points higher than our cuff) BP Readings from Last 3 Encounters:  08/22/16 (!) 144/88  07/18/16 (!) 180/101  07/04/16 (!) 144/102  A/P:Continue current meds:  We discussed increasing medication but she would prefer to work on dash eating plan/regular exercise before doing so- discussed ideally would be <130/80 with these changes but did not have to make medication chnages at this time.    Dyspnea on exertion S: Patient continues to experience intermittent shortness of breath. States if walks up her driveway or upstairs and this has been new over last year. Admits to being sedentary a lot. She also has some fatigue. Rare chest heaviness but oftentimes with stress and not always reproducible with the SOB she has with activity. As noted last visit- chest discomfort has been lessening in frequency.  We had previously planned on a stress test which is planned for 09/02/16 through stress echo.   Prior workup includes normal CBC, negative D-dimer, CXR largely normal except some fibrosis (considering CT after stress echo). For fatigue portoin we had also considered OSA testing with her sleep apnea. Deconditioning high on differential  Current prevention if this is cardiac includes efforts at BP control and she is taking asa 3x a week.  A/P: will await stress echocardiogram. After that point she plans on about 3 month  follow up. She will be out of town in [place] so likely could not do the chest CT until that time but would do this is stress echo completely normal.    Return in about 3 months (around 11/20/2016).  Meds ordered this encounter  Medications  . chlorthalidone (HYGROTON) 25 MG tablet    Sig: Take 1 tablet (25 mg total) by mouth daily.    Dispense:  90 tablet    Refill:  3    Return precautions advised.  Garret Reddish, MD

## 2016-08-22 NOTE — Telephone Encounter (Signed)
Pharmacy called they received Rx, pharmacy tech states too soon for refill. Instructed her to make pt aware. Tech verbalized understanding.

## 2016-08-22 NOTE — Patient Instructions (Addendum)
Blood pressure looks much better- goal <140/90 and you are usually and on average at that goal. Let's continue chlorthalidone. Also lets focus on healthier eating.   Want you to get back to exercising but lets wait until we get stress test results.   DASH Eating Plan DASH stands for "Dietary Approaches to Stop Hypertension." The DASH eating plan is a healthy eating plan that has been shown to reduce high blood pressure (hypertension). Additional health benefits may include reducing the risk of type 2 diabetes mellitus, heart disease, and stroke. The DASH eating plan may also help with weight loss. What do I need to know about the DASH eating plan? For the DASH eating plan, you will follow these general guidelines:  Choose foods with less than 150 milligrams of sodium per serving (as listed on the food label).  Use salt-free seasonings or herbs instead of table salt or sea salt.  Check with your health care provider or pharmacist before using salt substitutes.  Eat lower-sodium products. These are often labeled as "low-sodium" or "no salt added."  Eat fresh foods. Avoid eating a lot of canned foods.  Eat more vegetables, fruits, and low-fat dairy products.  Choose whole grains. Look for the word "whole" as the first word in the ingredient list.  Choose fish and skinless chicken or [place] more often than red meat. Limit fish, poultry, and meat to 6 oz (170 g) each day.  Limit sweets, desserts, sugars, and sugary drinks.  Choose heart-healthy fats.  Eat more home-cooked food and less restaurant, buffet, and fast food.  Limit fried foods.  Do not fry foods. Cook foods using methods such as baking, boiling, grilling, and broiling instead.  When eating at a restaurant, ask that your food be prepared with less salt, or no salt if possible. What foods can I eat? Seek help from a dietitian for individual calorie needs. Grains  Whole grain or whole wheat bread. Brown rice. Whole grain or  whole wheat pasta. Quinoa, bulgur, and whole grain cereals. Low-sodium cereals. Corn or whole wheat flour tortillas. Whole grain cornbread. Whole grain crackers. Low-sodium crackers. Vegetables  Fresh or frozen vegetables (raw, steamed, roasted, or grilled). Low-sodium or reduced-sodium tomato and vegetable juices. Low-sodium or reduced-sodium tomato sauce and paste. Low-sodium or reduced-sodium canned vegetables. Fruits  All fresh, canned (in natural juice), or frozen fruits. Meat and Other Protein Products  Ground beef (85% or leaner), grass-fed beef, or beef trimmed of fat. Skinless chicken or [place]. Ground chicken or [place]. Pork trimmed of fat. All fish and seafood. Eggs. Dried beans, peas, or lentils. Unsalted nuts and seeds. Unsalted canned beans. Dairy  Low-fat dairy products, such as skim or 1% milk, 2% or reduced-fat cheeses, low-fat ricotta or cottage cheese, or plain low-fat yogurt. Low-sodium or reduced-sodium cheeses. Fats and Oils  Tub margarines without trans fats. Light or reduced-fat mayonnaise and salad dressings (reduced sodium). Avocado. Safflower, olive, or canola oils. Natural peanut or almond butter. Other  Unsalted popcorn and pretzels. The items listed above may not be a complete list of recommended foods or beverages. Contact your dietitian for more options.  What foods are not recommended? Grains  White bread. White pasta. White rice. Refined cornbread. Bagels and croissants. Crackers that contain trans fat. Vegetables  Creamed or fried vegetables. Vegetables in a cheese sauce. Regular canned vegetables. Regular canned tomato sauce and paste. Regular tomato and vegetable juices. Fruits  Canned fruit in light or heavy syrup. Fruit juice. Meat and Other Protein Products  Fatty cuts of meat. Ribs, chicken wings, bacon, sausage, bologna, salami, chitterlings, fatback, hot dogs, bratwurst, and packaged luncheon meats. Salted nuts and seeds. Canned beans with salt. Dairy   Whole or 2% milk, cream, half-and-half, and cream cheese. Whole-fat or sweetened yogurt. Full-fat cheeses or blue cheese. Nondairy creamers and whipped toppings. Processed cheese, cheese spreads, or cheese curds. Condiments  Onion and garlic salt, seasoned salt, table salt, and sea salt. Canned and packaged gravies. Worcestershire sauce. Tartar sauce. Barbecue sauce. Teriyaki sauce. Soy sauce, including reduced sodium. Steak sauce. Fish sauce. Oyster sauce. Cocktail sauce. Horseradish. Ketchup and mustard. Meat flavorings and tenderizers. Bouillon cubes. Hot sauce. Tabasco sauce. Marinades. Taco seasonings. Relishes. Fats and Oils  Butter, stick margarine, lard, shortening, ghee, and bacon fat. Coconut, palm kernel, or palm oils. Regular salad dressings. Other  Pickles and olives. Salted popcorn and pretzels. The items listed above may not be a complete list of foods and beverages to avoid. Contact your dietitian for more information.  Where can I find more information? National Heart, Lung, and Blood Institute: travelstabloid.com This information is not intended to replace advice given to you by your health care provider. Make sure you discuss any questions you have with your health care provider. Document Released: 07/24/2011 Document Revised: 01/10/2016 Document Reviewed: 06/08/2013 Elsevier Interactive Patient Education  2017 [employer].

## 2016-08-22 NOTE — Progress Notes (Signed)
Pre visit review using our clinic review tool, if applicable. No additional management support is needed unless otherwise documented below in the visit note. 

## 2016-08-23 DIAGNOSIS — R0600 Dyspnea, unspecified: Secondary | ICD-10-CM | POA: Insufficient documentation

## 2016-08-23 DIAGNOSIS — I10 Essential (primary) hypertension: Secondary | ICD-10-CM | POA: Insufficient documentation

## 2016-08-23 DIAGNOSIS — R0609 Other forms of dyspnea: Secondary | ICD-10-CM

## 2016-08-23 NOTE — Assessment & Plan Note (Signed)
S: Patient continues to experience intermittent shortness of breath. States if walks up her driveway or upstairs and this has been new over last year. Admits to being sedentary a lot. She also has some fatigue. Rare chest heaviness but oftentimes with stress and not always reproducible with the SOB she has with activity. As noted last visit- chest discomfort has been lessening in frequency.  We had previously planned on a stress test which is planned for 09/02/16 through stress echo.   Prior workup includes normal CBC, negative D-dimer, CXR largely normal except some fibrosis (considering CT after stress echo). For fatigue portoin we had also considered OSA testing with her sleep apnea. Deconditioning high on differential  Current prevention if this is cardiac includes efforts at BP control and she is taking asa 3x a week.  A/P: will await stress echocardiogram. After that point she plans on about 3 month follow up. She will be out of town in [place] so likely could not do the chest CT until that time but would do this is stress echo completely normal.

## 2016-08-23 NOTE — Assessment & Plan Note (Signed)
S: controlled on chlorthalidone 25mg  per [facility]. Home #s look even better typically [id number] though diastolic does seem to at least be in the upper 80s.  Verified her home cuff today (her cuff runs just a few points higher than our cuff) BP Readings from Last 3 Encounters:  08/22/16 (!) 144/88  07/18/16 (!) 180/101  07/04/16 (!) 144/102  A/P:Continue current meds:  We discussed increasing medication but she would prefer to work on dash eating plan/regular exercise before doing so- discussed ideally would be <130/80 with these changes but did not have to make medication chnages at this time.

## 2016-09-01 ENCOUNTER — Encounter (HOSPITAL_COMMUNITY): Payer: Self-pay | Admitting: Radiology

## 2016-09-01 NOTE — CV Procedure (Signed)
Spoke with Dr. Meda Coffee regarding order for dobutamine stress echo. Dobutamine stress echocardiograms ordered by non Cardiologist must be given approval for test appropriateness by a [place] Cardiologist for patient safety. Dr. Meda Coffee says a transthoracic echo is more clinically indicated at this time. If clinically indicated you will have to order a transthoracic echo and inform the patient of test cancellation.

## 2016-09-02 ENCOUNTER — Other Ambulatory Visit: Payer: Self-pay

## 2016-09-02 ENCOUNTER — Ambulatory Visit (HOSPITAL_COMMUNITY): Payer: Medicare Other

## 2016-09-02 ENCOUNTER — Ambulatory Visit (HOSPITAL_COMMUNITY): Payer: Medicare Other | Attending: Cardiology

## 2016-09-02 ENCOUNTER — Encounter (HOSPITAL_COMMUNITY): Payer: Self-pay

## 2016-09-02 DIAGNOSIS — I10 Essential (primary) hypertension: Secondary | ICD-10-CM | POA: Diagnosis not present

## 2016-09-02 DIAGNOSIS — R0600 Dyspnea, unspecified: Secondary | ICD-10-CM

## 2016-09-02 DIAGNOSIS — R0609 Other forms of dyspnea: Secondary | ICD-10-CM | POA: Diagnosis not present

## 2016-09-02 LAB — ECHOCARDIOGRAM COMPLETE
AVLVOTPG: 5 mmHg
Ao-asc: 30 cm
CHL CUP MV DEC (S): 299
CHL CUP STROKE VOLUME: 34 mL
CHL CUP TV REG PEAK VELOCITY: 263 cm/s
EERAT: 13.83
EWDT: 299 ms
FS: 40 % (ref 28–44)
IVS/LV PW RATIO, ED: 1.15
LA ID, A-P, ES: 30 mm
LA vol A4C: 31 ml
LA vol index: 19.3 mL/m2
LA vol: 41 mL
LADIAMINDEX: 1.41 cm/m2
LEFT ATRIUM END SYS DIAM: 30 mm
LV E/e' medial: 13.83
LV PW d: 9.68 mm — AB (ref 0.6–1.1)
LV SIMPSON'S DISK: 59
LV TDI E'LATERAL: 6.14
LV dias vol: 58 mL (ref 46–106)
LV e' LATERAL: 6.14 cm/s
LV sys vol index: 11 mL/m2
LVDIAVOLIN: 27 mL/m2
LVEEAVG: 13.83
LVOT SV: 53 mL
LVOT VTI: 20.8 cm
LVOT area: 2.54 cm2
LVOT diameter: 18 mm
LVOT peak vel: 111 cm/s
LVSYSVOL: 24 mL (ref 14–42)
MV Peak grad: 3 mmHg
MVPKAVEL: 99.7 m/s
MVPKEVEL: 84.9 m/s
RV LATERAL S' VELOCITY: 20.4 cm/s
RV sys press: 31 mmHg
TDI e' medial: 5.36
TR max vel: 263 cm/s

## 2016-09-12 ENCOUNTER — Telehealth: Payer: Self-pay | Admitting: Family Medicine

## 2016-09-12 NOTE — Telephone Encounter (Signed)
Pt would like you to call them concerning echo results done 09/02/2016.

## 2016-09-15 NOTE — Telephone Encounter (Signed)
Discussed grade I dysfunction by phone. Otherwise normal. Going out of town so cannot do stress echo yet- will call us when back in town (likely 3 months). Given this gap- she knows if worsening symptoms to seek care in the emergency room in [place]. May consider ordering lexiscan myoview instead given her obesity.

## 2016-09-15 NOTE — Telephone Encounter (Signed)
Spoke with patient who is asking about her results. Can you look and review her results?

## 2016-11-28 ENCOUNTER — Telehealth: Payer: Self-pay | Admitting: Family Medicine

## 2016-11-28 NOTE — Telephone Encounter (Signed)
Pt has a cough, some phlegm, nose dripping and stopped up. Pt is taking Nyquil at night, mucinex at night, aleve occassionally.  Would like to know if there is anything else she can do or take.

## 2016-11-28 NOTE — Telephone Encounter (Signed)
Spoke with pt and advised her to continue Mucinex, can add Delsym for cough suppressant, saline nasal spray and plenty of fluids. She does report friends with similar symptoms. Advised her to contact office if not improving. Nothing further needed at this time.

## 2016-12-04 ENCOUNTER — Telehealth (HOSPITAL_COMMUNITY): Payer: Self-pay | Admitting: *Deleted

## 2016-12-04 NOTE — Telephone Encounter (Signed)
Patient given detailed instructions per Stress Test Requisition Sheet for test on 12/09/16 at 2:30.Patient Notified to arrive 30 minutes early, and that it is imperative to arrive on time for appointment to keep from having the test rescheduled.  Patient verbalized understanding. Shelly Johnson

## 2016-12-04 NOTE — Telephone Encounter (Signed)
Left message on voicemail in reference to upcoming appointment scheduled for 12/09/16. Phone number given for a call back so details instructions can be given. Veronia Beets

## 2016-12-08 ENCOUNTER — Ambulatory Visit (INDEPENDENT_AMBULATORY_CARE_PROVIDER_SITE_OTHER): Payer: Medicare Other | Admitting: Family Medicine

## 2016-12-08 ENCOUNTER — Encounter: Payer: Self-pay | Admitting: Family Medicine

## 2016-12-08 ENCOUNTER — Ambulatory Visit (INDEPENDENT_AMBULATORY_CARE_PROVIDER_SITE_OTHER)
Admission: RE | Admit: 2016-12-08 | Discharge: 2016-12-08 | Disposition: A | Discharge status: Home / Self Care | Payer: Medicare Other | Source: Ambulatory Visit | Attending: Family Medicine | Admitting: Family Medicine

## 2016-12-08 VITALS — BP 146/88 | HR 94 | Temp 98.2°F | Ht 61.5 in | Wt 218.4 lb

## 2016-12-08 DIAGNOSIS — B9689 Other specified bacterial agents as the cause of diseases classified elsewhere: Secondary | ICD-10-CM | POA: Diagnosis not present

## 2016-12-08 DIAGNOSIS — R0602 Shortness of breath: Secondary | ICD-10-CM | POA: Diagnosis not present

## 2016-12-08 DIAGNOSIS — R05 Cough: Secondary | ICD-10-CM

## 2016-12-08 DIAGNOSIS — J329 Chronic sinusitis, unspecified: Secondary | ICD-10-CM

## 2016-12-08 DIAGNOSIS — R059 Cough, unspecified: Secondary | ICD-10-CM

## 2016-12-08 MED ORDER — AMOXICILLIN-POT CLAVULANATE 875-125 MG PO TABS: 1.0000 | ORAL_TABLET | Freq: Two times a day (BID) | ORAL | 0 refills | Status: DC

## 2016-12-08 NOTE — Progress Notes (Signed)
Pre visit review using our clinic review tool, if applicable. No additional management support is needed unless otherwise documented below in the visit note. 

## 2016-12-08 NOTE — Progress Notes (Signed)
PCP: Garret Reddish, MD  Subjective:  Shelly Johnson is a 71 y.o. year old very pleasant female patient who presents with sinusitis symptoms including nasal congestion, sinus tenderness, cough -other symptoms include: subjective fevers, fatigue. Has had some baseline shortness of breath but feels more winded with this episode -day of illness:14 -Symptoms show no change -previous treatments: robitussen, nyquil -sick contacts/travel/risks: denies flu exposure. Denies sick contacts  ROS-denies measrued fever, NVD, tooth pain  Pertinent Past Medical History-  Patient Active Problem List   Diagnosis Date Noted  . Essential hypertension 08/23/2016    Priority: Medium  . ANEMIA-NOS 03/16/2008    Priority: Medium  . ANXIETY DEPRESSION 03/08/2008    Priority: Medium  . HIATAL HERNIA WITH REFLUX 03/08/2008    Priority: Low  . Dyspnea on exertion 08/23/2016    Medications- reviewed  Current Outpatient Prescriptions  Medication Sig Dispense Refill  . aspirin 81 MG tablet Take 81 mg by mouth daily. Pt takes 3 times a week    . chlorthalidone (HYGROTON) 25 MG tablet Take 1 tablet (25 mg total) by mouth daily. 90 tablet 3  . ferrous sulfate 325 (65 FE) MG tablet Take 1 tablet (325 mg total) by mouth 2 (two) times daily. 60 tablet 3  . naproxen sodium (ALEVE) 220 MG tablet Take 220 mg by mouth as needed.     Marland Kitchen omeprazole (PRILOSEC) 40 MG capsule Take 1 capsule (40 mg total) by mouth daily. 30 capsule 3  . vitamin C (ASCORBIC ACID) 500 MG tablet Take 500 mg by mouth daily.       No current facility-administered medications for this visit.     Objective: BP (!) 146/88 (BP Location: Left Arm, Patient Position: Sitting, Cuff Size: Large)   Pulse 94   Temp 98.2 F (36.8 C) (Oral)   Ht 5' 1.5" (1.562 m)   Wt 218 lb 6.4 oz (99.1 kg)   SpO2 95%   BMI 40.60 kg/m  Gen: NAD, resting comfortably HEENT: Turbinates erythematous with minimal drainage, TM normal, pharynx mildly erythematous with no  tonsilar exudate or edema, left maxillary sinus tenderness CV: RRR no murmurs rubs or gallops Lungs: CTAB no crackles, wheeze, rhonchi Ext: no edema Skin: warm, dry, no rash Neuro: grossly normal, moves all extremities  Assessment/Plan:  Sinsusitis Bacterial based on: Symptoms >10 days  Also with her increase in shortness of breath will get x-ray  Treatment: -considered steroid: we opted out- she is concerned abotu weight gain -other symptomatic care with nyquil or robitussen that she is on is reasonable -Antibiotic indicated: yes  Will monitor BP- likely up due discomfort of frequent cough  Finally, we reviewed reasons to return to care including if symptoms worsen or persist or new concerns arise (particularly fever or shortness of breath)  Meds ordered this encounter  Medications  . amoxicillin-clavulanate (AUGMENTIN) 875-125 MG tablet    Sig: Take 1 tablet by mouth 2 (two) times daily.    Dispense:  14 tablet    Refill:  0  new acute illness with medication management.   Garret Reddish, MD

## 2016-12-08 NOTE — Patient Instructions (Signed)
Sinsusitis Bacterial based on: Symptoms >10 days  Also with her increase in shortness of breath will get x-ray  Treatment: -considered steroid: we opted out- she is concerned abotu weight gain -other symptomatic care with nyquil or robitussen that she is on is reasonable -Antibiotic indicated: yes  Finally, we reviewed reasons to return to care including if symptoms worsen or persist or new concerns arise (particularly fever or worsening shortness of breath)  Meds ordered this encounter  Medications  . amoxicillin-clavulanate (AUGMENTIN) 875-125 MG tablet    Sig: Take 1 tablet by mouth 2 (two) times daily.    Dispense:  14 tablet    Refill:  0   Please go to [facility] - located [address] across the [address] from [place] - in the basement - Hours: 8:30-5:30 PM M-F. Do not need appointment.

## 2016-12-09 ENCOUNTER — Telehealth (HOSPITAL_COMMUNITY): Payer: Self-pay

## 2016-12-09 ENCOUNTER — Ambulatory Visit (HOSPITAL_COMMUNITY): Payer: Medicare Other | Attending: Cardiovascular Disease

## 2016-12-09 ENCOUNTER — Encounter (HOSPITAL_COMMUNITY): Payer: Self-pay

## 2016-12-09 ENCOUNTER — Telehealth (HOSPITAL_COMMUNITY): Payer: Self-pay | Admitting: Radiology

## 2016-12-09 ENCOUNTER — Ambulatory Visit (HOSPITAL_COMMUNITY): Payer: Medicare Other

## 2016-12-09 NOTE — Telephone Encounter (Signed)
Ok thx.

## 2016-12-09 NOTE — Telephone Encounter (Signed)
Shelly Johnson came in for her stress test today. She was prepped and when her BP was taken she was hypertensive. It was checked several times for accuracy and changes. [name] spoke with the Doctor of the day, [name] he advised her BP would be ok to go ahead and walk her. The pt came in with Sinusitis and a cough. Upon further discussion she decided that she was uncomfortable proceeding with the test. She has an upcoming appointment with [name] and decided that she would discuss it with him.

## 2016-12-09 NOTE — Telephone Encounter (Signed)
Patient had elevated blood pressures before the start of the test on 12/09/16 : 1) 167/106  2) 170/106 3) 167/112 4)  155/106  Spoke with the DOD (Dr Lovena Le) and was given the ok to proceed with the test.

## 2016-12-12 ENCOUNTER — Ambulatory Visit (INDEPENDENT_AMBULATORY_CARE_PROVIDER_SITE_OTHER): Payer: Medicare Other | Admitting: Cardiovascular Disease

## 2016-12-12 ENCOUNTER — Encounter: Payer: Self-pay | Admitting: Cardiovascular Disease

## 2016-12-12 VITALS — BP 134/100 | HR 84 | Ht 62.0 in | Wt 220.0 lb

## 2016-12-12 DIAGNOSIS — I10 Essential (primary) hypertension: Secondary | ICD-10-CM

## 2016-12-12 DIAGNOSIS — R0609 Other forms of dyspnea: Secondary | ICD-10-CM

## 2016-12-12 DIAGNOSIS — R0600 Dyspnea, unspecified: Secondary | ICD-10-CM

## 2016-12-12 DIAGNOSIS — R0789 Other chest pain: Secondary | ICD-10-CM

## 2016-12-12 NOTE — Patient Instructions (Addendum)
Medication Instructions:  Your physician recommends that you continue on your current medications as directed. Please refer to the Current Medication list given to you today.  Labwork: No new orders.   Testing/Procedures: Your physician has requested that you have an exercise stress myoview. For further information please visit [email or web address]. Please follow instruction sheet, as given.  Follow-Up: Your physician recommends that you schedule a follow-up appointment as needed with Dr Burt Knack.    Any Other Special Instructions Will Be Listed Below (If Applicable).     If you need a refill on your cardiac medications before your next appointment, please call your pharmacy.

## 2016-12-12 NOTE — Progress Notes (Signed)
Cardiology Office Note Date:  12/12/2016   ID:  Shelly Johnson 1946/05/26, MRN [id number]  PCP:  Garret Reddish, MD  Cardiologist:  Sherren Mocha, MD    Chief Complaint  Patient presents with  . New Patient (Initial Visit)     History of Present Illness: Shelly Johnson is a 71 y.o. female who presents for evaluation of shortness of breath. Over the past 2 years she's developed progressive shortness of breath with activity. She used to like to walk for exercise and work in the garden but no longer able to do these things. She also complains of a near constant pressure in the epigastrium and chest, worse with eating. This is present over 1-2 years. She's been under a lot of stress and thinks this may be contributing. She denies orthopnea, PND, or leg swelling. She denies lightheadedness or syncope. She complains of marked fatigue.   Past Medical History:  Diagnosis Date  . Anemia   . Anxiety and depression    during loss of husband  . Arthritis   . Diverticulosis   . GERD (gastroesophageal reflux disease)   . GI bleed    hx of; hgb as low as 5.9  . Hemorrhoids   . Hiatal hernia   . Obesity   . Pneumonia   . Urinary tract bacterial infections     Past Surgical History:  Procedure Laterality Date  . ABDOMINAL HYSTERECTOMY     states had a "few cancer cells" but they "got everything". ? ovaries and cervix.   . APPENDECTOMY    . HIATAL HERNIA REPAIR      Current Outpatient Prescriptions  Medication Sig Dispense Refill  . amoxicillin-clavulanate (AUGMENTIN) 875-125 MG tablet Take 1 tablet by mouth 2 (two) times daily. 14 tablet 0  . aspirin 81 MG tablet Take 81 mg by mouth 3 (three) times a week. Pt takes 3 times a week    . chlorthalidone (HYGROTON) 25 MG tablet Take 1 tablet (25 mg total) by mouth daily. 90 tablet 3  . ferrous sulfate 325 (65 FE) MG tablet Take 1 tablet (325 mg total) by mouth 2 (two) times daily. 60 tablet 3  . naproxen sodium (ALEVE) 220 MG tablet  Take 220 mg by mouth daily as needed (pain).     Marland Kitchen omeprazole (PRILOSEC) 40 MG capsule Take 1 capsule (40 mg total) by mouth daily. 30 capsule 3  . vitamin C (ASCORBIC ACID) 500 MG tablet Take 500 mg by mouth daily.       No current facility-administered medications for this visit.     Allergies:   Nsaids and Hydrocodone   Social History:  The patient  reports that she has never smoked. She has never used smokeless tobacco. She reports that she drinks alcohol. She reports that she does not use drugs.   Family History:  The patient's  family history includes Arthritis in her mother; Breast cancer in her maternal grandmother; Cancer in her mother; Colon cancer (age of onset: 72) in her maternal aunt; Diabetes in her brother; Hypertension in her brother.    ROS:  Please see the history of present illness.  Otherwise, review of systems is positive for weight gain, chills, fatigue.  All other systems are reviewed and negative.    PHYSICAL EXAM: VS:  BP (!) 134/100   Pulse 84   Ht 5\' 2"  (1.575 m)   Wt 220 lb (99.8 kg)   BMI 40.24 kg/m  , BMI Body mass index  is 40.24 kg/m. GEN: Well nourished, well developed, pleasant obese woman in no acute distress  HEENT: normal  Neck: no JVD, no masses. No carotid bruits Cardiac: RRR without murmur or gallop                Respiratory:  clear to auscultation bilaterally, normal work of breathing GI: soft, nontender, nondistended, + BS MS: no deformity or atrophy  Ext: no pretibial edema, pedal pulses 2+= bilaterally Skin: warm and dry, no rash Neuro:  Strength and sensation are intact Psych: euthymic mood, full affect  EKG:  EKG is ordered today. The ekg ordered today shows NSR 84 bpm, low voltage, otherwise within normal limits  Recent Labs: 03/12/2016: Hemoglobin 13.4; Platelets 216.0 03/14/2016: ALT 27; BUN 13; Creatinine, Ser 0.82; Potassium 4.0; Sodium 139; TSH 3.36   Lipid Panel     Component Value Date/Time   CHOL 194 07/18/2013 1018     TRIG 111.0 07/18/2013 1018   HDL 65.50 07/18/2013 1018   CHOLHDL 3 07/18/2013 1018   VLDL 22.2 07/18/2013 1018   LDLCALC 106 (H) 07/18/2013 1018      Wt Readings from Last 3 Encounters:  12/12/16 220 lb (99.8 kg)  12/08/16 218 lb 6.4 oz (99.1 kg)  08/22/16 217 lb 6.4 oz (98.6 kg)     Cardiac Studies Reviewed: Echo 09-02-2016: Study Conclusions  - Left ventricle: The cavity size was normal. Wall thickness was   normal. Systolic function was normal. The estimated ejection   fraction was in the range of 60% to 65%. Wall motion was normal;   there were no regional wall motion abnormalities. Doppler   parameters are consistent with abnormal left ventricular   relaxation (grade 1 diastolic dysfunction). - Mitral valve: There was trivial regurgitation. - Tricuspid valve: There was trivial regurgitation. - Pulmonary arteries: Systolic pressure was mildly increased. PA   peak pressure: 31 mm Hg (S).  CXR 12-08-16: FINDINGS: Normal heart size. Large hiatal hernia. Lungs clear. No pneumothorax. No pleural effusion.  IMPRESSION: No active cardiopulmonary disease.  ASSESSMENT AND PLAN: 1.  Shortness of breath: The patient's echo is reviewed. She has normal LV function with grade 1 diastolic dysfunction. Suspect weight gain is the most significant contributor to her breathing. She has become quite deconditioned it seems. We discussed further evaluation and it seems reasonable to pursue stress testing. She was initially set up for an exercise stress echo but her blood pressure was markedly elevated when she arrived here and she could not perform the stress test because of our protocol. Will arrange an exercise perfusion scan. If she arrives with elevated blood pressure again, she should be okay to do Lexiscan rather than exercise.  2. Central chest pressure: Typical and atypical features. A large hiatal hernia is noted on her chest x-ray. We'll check a stress test as above.  3.  Hypertension: I reviewed her home blood pressure readings at length. Most of her readings are in good range. I think she has significant white coat hypertension. Would continue with chlorthalidone.  4. Morbid obesity BMI greater than 40: Lengthy discussion about lifestyle modification, weight loss strategies, and the importance of initiating an exercise program.  Current medicines are reviewed with the patient today.  The patient does not have concerns regarding medicines.  Labs/ tests ordered today include:  No orders of the defined types were placed in this encounter.   Disposition:   FU prn pending test results  Signed, Sherren Mocha, MD  12/12/2016 10:21 AM  [facility] [place], [place], [place]  [address] Phone: [phone]; Fax: [phone]

## 2016-12-15 ENCOUNTER — Telehealth (HOSPITAL_COMMUNITY): Payer: Self-pay | Admitting: *Deleted

## 2016-12-15 NOTE — Telephone Encounter (Signed)
Left message on voicemail in reference to upcoming appointment scheduled for 12/17/16. Phone number given for a call back so details instructions can be given. Shelly Johnson

## 2016-12-16 ENCOUNTER — Telehealth (HOSPITAL_COMMUNITY): Payer: Self-pay | Admitting: *Deleted

## 2016-12-16 NOTE — Telephone Encounter (Signed)
Patient given detailed instructions per Myocardial Perfusion Study Information Sheet for the test on 12/17/16 at 1000. Patient notified to arrive 15 minutes early and that it is imperative to arrive on time for appointment to keep from having the test rescheduled.  If you need to cancel or reschedule your appointment, please call the office within 24 hours of your appointment. Failure to do so may result in a cancellation of your appointment, and a $50 no show fee. Patient verbalized understanding.Glori Machnik, Ranae Palms

## 2016-12-17 ENCOUNTER — Ambulatory Visit (HOSPITAL_COMMUNITY): Payer: Medicare Other | Attending: Cardiology

## 2016-12-17 DIAGNOSIS — R0600 Dyspnea, unspecified: Secondary | ICD-10-CM

## 2016-12-17 DIAGNOSIS — R0609 Other forms of dyspnea: Secondary | ICD-10-CM

## 2016-12-17 DIAGNOSIS — R0789 Other chest pain: Secondary | ICD-10-CM | POA: Diagnosis not present

## 2016-12-17 DIAGNOSIS — I10 Essential (primary) hypertension: Secondary | ICD-10-CM | POA: Diagnosis not present

## 2016-12-17 MED ORDER — TECHNETIUM TC 99M TETROFOSMIN IV KIT
31.5000 | PACK | Freq: Once | INTRAVENOUS | Status: AC | PRN
  Administered 2016-12-17: 31.5 via INTRAVENOUS
  Filled 2016-12-17: qty 32

## 2016-12-18 ENCOUNTER — Ambulatory Visit (HOSPITAL_COMMUNITY): Payer: Medicare Other | Attending: Cardiology

## 2016-12-18 LAB — MYOCARDIAL PERFUSION IMAGING
CHL CUP NUCLEAR SDS: 2
CHL CUP NUCLEAR SSS: 6
CSEPED: 4 min
CSEPEDS: 0 s
CSEPEW: 4.6 METS
CSEPHR: 93 %
CSEPPHR: 142 {beats}/min
LV sys vol: 35 mL
LVDIAVOL: 78 mL (ref 46–106)
MPHR: 152 {beats}/min
RATE: 0.31
Rest HR: 87 {beats}/min
SRS: 4
TID: 0.9

## 2016-12-18 MED ORDER — TECHNETIUM TC 99M TETROFOSMIN IV KIT
33.0000 | PACK | Freq: Once | INTRAVENOUS | Status: AC | PRN
  Administered 2016-12-18: 33 via INTRAVENOUS
  Filled 2016-12-18: qty 33

## 2016-12-23 ENCOUNTER — Telehealth: Payer: Self-pay | Admitting: Cardiovascular Disease

## 2016-12-23 NOTE — Telephone Encounter (Signed)
New message      Pt has questions about her echo/stress test.  Please call

## 2016-12-23 NOTE — Telephone Encounter (Signed)
I spoke with the pt and reviewed her myoview results by phone.  I spoke with her about weight loss and following a low carbohydrate diet.

## 2017-02-02 ENCOUNTER — Ambulatory Visit: Payer: Medicare Other

## 2017-02-05 ENCOUNTER — Ambulatory Visit: Payer: Medicare Other

## 2017-02-05 VITALS — BP 128/78 | HR 80 | Ht 62.0 in | Wt 219.0 lb

## 2017-02-05 DIAGNOSIS — Z0000 Encounter for general adult medical examination without abnormal findings: Secondary | ICD-10-CM

## 2017-02-05 DIAGNOSIS — Z1239 Encounter for other screening for malignant neoplasm of breast: Secondary | ICD-10-CM

## 2017-02-05 DIAGNOSIS — E2839 Other primary ovarian failure: Secondary | ICD-10-CM

## 2017-02-05 NOTE — Progress Notes (Signed)
I have reviewed and agree with note, evaluation, plan.  I am ok with the weight management referral- have oked Sabas Sous, RN to enter this.   Garret Reddish, MD

## 2017-02-05 NOTE — Addendum Note (Signed)
Addended by: Sabas Sous R on: 02/05/2017 10:20 PM   Modules accepted: Orders

## 2017-02-05 NOTE — Patient Instructions (Addendum)
Bring a copy of your advance directives to your next office visit.  Continue doing brain stimulating activities (puzzles, reading, adult coloring books, staying active) to keep memory sharp.   Schedule mammogram and bone scan  Dr. Leafy Ro (weight management clinic) [phone]   DASH Eating Plan [place] stands for "Dietary Approaches to Stop Hypertension." The DASH eating plan is a healthy eating plan that has been shown to reduce high blood pressure (hypertension). It may also reduce your risk for type 2 diabetes, heart disease, and stroke. The DASH eating plan may also help with weight loss. What are tips for following this plan? General guidelines  Avoid eating more than 2,300 mg (milligrams) of salt (sodium) a day. If you have hypertension, you may need to reduce your sodium intake to 1,500 mg a day.  Limit alcohol intake to no more than 1 drink a day for nonpregnant women and 2 drinks a day for men. One drink equals 12 oz of beer, 5 oz of wine, or 1 oz of hard liquor.  Work with your health care provider to maintain a healthy body weight or to lose weight. Ask what an ideal weight is for you.  Get at least 30 minutes of exercise that causes your heart to beat faster (aerobic exercise) most days of the week. Activities may include walking, swimming, or biking.  Work with your health care provider or diet and nutrition specialist (dietitian) to adjust your eating plan to your individual calorie needs. Reading food labels  Check food labels for the amount of sodium per serving. Choose foods with less than 5 percent of the Daily Value of sodium. Generally, foods with less than 300 mg of sodium per serving fit into this eating plan.  To find whole grains, look for the word "whole" as the first word in the ingredient list. Shopping  Buy products labeled as "low-sodium" or "no salt added."  Buy fresh foods. Avoid canned foods and premade or frozen meals. Cooking  Avoid adding salt  when cooking. Use salt-free seasonings or herbs instead of table salt or sea salt. Check with your health care provider or pharmacist before using salt substitutes.  Do not fry foods. Cook foods using healthy methods such as baking, boiling, grilling, and broiling instead.  Cook with heart-healthy oils, such as olive, canola, soybean, or sunflower oil. Meal planning   Eat a balanced diet that includes: ? 5 or more servings of fruits and vegetables each day. At each meal, try to fill half of your plate with fruits and vegetables. ? Up to 6-8 servings of whole grains each day. ? Less than 6 oz of lean meat, poultry, or fish each day. A 3-oz serving of meat is about the same size as a deck of cards. One egg equals 1 oz. ? 2 servings of low-fat dairy each day. ? A serving of nuts, seeds, or beans 5 times each week. ? Heart-healthy fats. Healthy fats called Omega-3 fatty acids are found in foods such as flaxseeds and coldwater fish, like sardines, salmon, and mackerel.  Limit how much you eat of the following: ? Canned or prepackaged foods. ? Food that is high in trans fat, such as fried foods. ? Food that is high in saturated fat, such as fatty meat. ? Sweets, desserts, sugary drinks, and other foods with added sugar. ? Full-fat dairy products.  Do not salt foods before eating.  Try to eat at least 2 vegetarian meals each week.  Eat more home-cooked food and  less restaurant, buffet, and fast food.  When eating at a restaurant, ask that your food be prepared with less salt or no salt, if possible. What foods are recommended? The items listed may not be a complete list. Talk with your dietitian about what dietary choices are best for you. Grains Whole-grain or whole-wheat bread. Whole-grain or whole-wheat pasta. Brown rice. [place]. Bulgur. Whole-grain and low-sodium cereals. Pita bread. Low-fat, low-sodium crackers. Whole-wheat flour tortillas. Vegetables Fresh or frozen  vegetables (raw, steamed, roasted, or grilled). Low-sodium or reduced-sodium tomato and vegetable juice. Low-sodium or reduced-sodium tomato sauce and tomato paste. Low-sodium or reduced-sodium canned vegetables. Fruits All fresh, dried, or frozen fruit. Canned fruit in natural juice (without added sugar). Meat and other protein foods Skinless chicken or [place]. Ground chicken or [place]. Pork with fat trimmed off. Fish and seafood. Egg whites. Dried beans, peas, or lentils. Unsalted nuts, nut butters, and seeds. Unsalted canned beans. Lean cuts of beef with fat trimmed off. Low-sodium, lean deli meat. Dairy Low-fat (1%) or fat-free (skim) milk. Fat-free, low-fat, or reduced-fat cheeses. Nonfat, low-sodium ricotta or cottage cheese. Low-fat or nonfat yogurt. Low-fat, low-sodium cheese. Fats and oils Soft margarine without trans fats. Vegetable oil. Low-fat, reduced-fat, or light mayonnaise and salad dressings (reduced-sodium). Canola, safflower, olive, soybean, and sunflower oils. Avocado. Seasoning and other foods Herbs. Spices. Seasoning mixes without salt. Unsalted popcorn and pretzels. Fat-free sweets. What foods are not recommended? The items listed may not be a complete list. Talk with your dietitian about what dietary choices are best for you. Grains Baked goods made with fat, such as croissants, muffins, or some breads. Dry pasta or rice meal packs. Vegetables Creamed or fried vegetables. Vegetables in a cheese sauce. Regular canned vegetables (not low-sodium or reduced-sodium). Regular canned tomato sauce and paste (not low-sodium or reduced-sodium). Regular tomato and vegetable juice (not low-sodium or reduced-sodium). Angie Fava. Olives. Fruits Canned fruit in a light or heavy syrup. Fried fruit. Fruit in cream or butter sauce. Meat and other protein foods Fatty cuts of meat. Ribs. Fried meat. Berniece Salines. Sausage. Bologna and other processed lunch meats. Salami. Fatback. Hotdogs. Bratwurst.  Salted nuts and seeds. Canned beans with added salt. Canned or smoked fish. Whole eggs or egg yolks. Chicken or [place] with skin. Dairy Whole or 2% milk, cream, and half-and-half. Whole or full-fat cream cheese. Whole-fat or sweetened yogurt. Full-fat cheese. Nondairy creamers. Whipped toppings. Processed cheese and cheese spreads. Fats and oils Butter. Stick margarine. Lard. Shortening. Ghee. Bacon fat. Tropical oils, such as coconut, palm kernel, or palm oil. Seasoning and other foods Salted popcorn and pretzels. Onion salt, garlic salt, seasoned salt, table salt, and sea salt. Worcestershire sauce. Tartar sauce. Barbecue sauce. Teriyaki sauce. Soy sauce, including reduced-sodium. Steak sauce. Canned and packaged gravies. Fish sauce. Oyster sauce. Cocktail sauce. Horseradish that you find on the shelf. Ketchup. Mustard. Meat flavorings and tenderizers. Bouillon cubes. Hot sauce and Tabasco sauce. Premade or packaged marinades. Premade or packaged taco seasonings. Relishes. Regular salad dressings. Where to find more information:  National Heart, Lung, and [place]: https://wilson-eaton.com/  American Heart Association: www.heart.org Summary  The DASH eating plan is a healthy eating plan that has been shown to reduce high blood pressure (hypertension). It may also reduce your risk for type 2 diabetes, heart disease, and stroke.  With the DASH eating plan, you should limit salt (sodium) intake to 2,300 mg a day. If you have hypertension, you may need to reduce your sodium intake to 1,500 mg a day.  When on the DASH eating plan, aim to eat more fresh fruits and vegetables, whole grains, lean proteins, low-fat dairy, and heart-healthy fats.  Work with your health care provider or diet and nutrition specialist (dietitian) to adjust your eating plan to your individual calorie needs. This information is not intended to replace advice given to you by your health care provider. Make sure you discuss any  questions you have with your health care provider. Document Released: 07/24/2011 Document Revised: 07/28/2016 Document Reviewed: 07/28/2016 Elsevier Interactive Patient Education  2017 [place] Prevention in the Home Falls can cause injuries. They can happen to people of all ages. There are many things you can do to make your home safe and to help prevent falls. What can I do on the outside of my home?  Regularly fix the edges of walkways and driveways and fix any cracks.  Remove anything that might make you trip as you walk through a door, such as a raised step or threshold.  Trim any bushes or trees on the path to your home.  Use bright outdoor lighting.  Clear any walking paths of anything that might make someone trip, such as rocks or tools.  Regularly check to see if handrails are loose or broken. Make sure that both sides of any steps have handrails.  Any raised decks and porches should have guardrails on the edges.  Have any leaves, snow, or ice cleared regularly.  Use sand or salt on walking paths during winter.  Clean up any spills in your garage right away. This includes oil or grease spills. What can I do in the bathroom?  Use night lights.  Install grab bars by the toilet and in the tub and shower. Do not use towel bars as grab bars.  Use non-skid mats or decals in the tub or shower.  If you need to sit down in the shower, use a plastic, non-slip stool.  Keep the floor dry. Clean up any water that spills on the floor as soon as it happens.  Remove soap buildup in the tub or shower regularly.  Attach bath mats securely with double-sided non-slip rug tape.  Do not have throw rugs and other things on the floor that can make you trip. What can I do in the bedroom?  Use night lights.  Make sure that you have a light by your bed that is easy to reach.  Do not use any sheets or blankets that are too big for your bed. They should not hang down onto  the floor.  Have a firm chair that has side arms. You can use this for support while you get dressed.  Do not have throw rugs and other things on the floor that can make you trip. What can I do in the kitchen?  Clean up any spills right away.  Avoid walking on wet floors.  Keep items that you use a lot in easy-to-reach places.  If you need to reach something above you, use a strong step stool that has a grab bar.  Keep electrical cords out of the way.  Do not use floor polish or wax that makes floors slippery. If you must use wax, use non-skid floor wax.  Do not have throw rugs and other things on the floor that can make you trip. What can I do with my stairs?  Do not leave any items on the stairs.  Make sure that there are handrails on both sides of the  stairs and use them. Fix handrails that are broken or loose. Make sure that handrails are as long as the stairways.  Check any carpeting to make sure that it is firmly attached to the stairs. Fix any carpet that is loose or worn.  Avoid having throw rugs at the top or bottom of the stairs. If you do have throw rugs, attach them to the floor with carpet tape.  Make sure that you have a light switch at the top of the stairs and the bottom of the stairs. If you do not have them, ask someone to add them for you. What else can I do to help prevent falls?  Wear shoes that: ? Do not have high heels. ? Have rubber bottoms. ? Are comfortable and fit you well. ? Are closed at the toe. Do not wear sandals.  If you use a stepladder: ? Make sure that it is fully opened. Do not climb a closed stepladder. ? Make sure that both sides of the stepladder are locked into place. ? Ask someone to hold it for you, if possible.  Clearly mark and make sure that you can see: ? Any grab bars or handrails. ? First and last steps. ? Where the edge of each step is.  Use tools that help you move around (mobility aids) if they are needed. These  include: ? Canes. ? Walkers. ? Scooters. ? Crutches.  Turn on the lights when you go into a dark area. Replace any light bulbs as soon as they burn out.  Set up your furniture so you have a clear path. Avoid moving your furniture around.  If any of your floors are uneven, fix them.  If there are any pets around you, be aware of where they are.  Review your medicines with your doctor. Some medicines can make you feel dizzy. This can increase your chance of falling. Ask your doctor what other things that you can do to help prevent falls. This information is not intended to replace advice given to you by your health care provider. Make sure you discuss any questions you have with your health care provider. Document Released: 05/31/2009 Document Revised: 01/10/2016 Document Reviewed: 09/08/2014 Elsevier Interactive Patient Education  2018 [place] Maintenance, Female Adopting a healthy lifestyle and getting preventive care can go a long way to promote health and wellness. Talk with your health care provider about what schedule of regular examinations is right for you. This is a good chance for you to check in with your provider about disease prevention and staying healthy. In between checkups, there are plenty of things you can do on your own. Experts have done a lot of research about which lifestyle changes and preventive measures are most likely to keep you healthy. Ask your health care provider for more information. Weight and diet Eat a healthy diet  Be sure to include plenty of vegetables, fruits, low-fat dairy products, and lean protein.  Do not eat a lot of foods high in solid fats, added sugars, or salt.  Get regular exercise. This is one of the most important things you can do for your health. ? Most adults should exercise for at least 150 minutes each week. The exercise should increase your heart rate and make you sweat (moderate-intensity exercise). ? Most adults  should also do strengthening exercises at least twice a week. This is in addition to the moderate-intensity exercise.  Maintain a healthy weight  Body mass index (BMI) is a  measurement that can be used to identify possible weight problems. It estimates body fat based on height and weight. Your health care provider can help determine your BMI and help you achieve or maintain a healthy weight.  For females 43 years of age and older: ? A BMI below 18.5 is considered underweight. ? A BMI of 18.5 to 24.9 is normal. ? A BMI of 25 to 29.9 is considered overweight. ? A BMI of 30 and above is considered obese.  Watch levels of cholesterol and blood lipids  You should start having your blood tested for lipids and cholesterol at 69 years of age, then have this test every 5 years.  You may need to have your cholesterol levels checked more often if: ? Your lipid or cholesterol levels are high. ? You are older than 69 years of age. ? You are at high risk for heart disease.  Cancer screening Lung Cancer  Lung cancer screening is recommended for adults 60-52 years old who are at high risk for lung cancer because of a history of smoking.  A yearly low-dose CT scan of the lungs is recommended for people who: ? Currently smoke. ? Have quit within the past 15 years. ? Have at least a 30-pack-year history of smoking. A pack year is smoking an average of one pack of cigarettes a day for 1 year.  Yearly screening should continue until it has been 15 years since you quit.  Yearly screening should stop if you develop a health problem that would prevent you from having lung cancer treatment.  Breast Cancer  Practice breast self-awareness. This means understanding how your breasts normally appear and feel.  It also means doing regular breast self-exams. Let your health care provider know about any changes, no matter how small.  If you are in your 20s or 30s, you should have a clinical breast exam (CBE)  by a health care provider every 1-3 years as part of a regular health exam.  If you are 11 or older, have a CBE every year. Also consider having a breast X-ray (mammogram) every year.  If you have a family history of breast cancer, talk to your health care provider about genetic screening.  If you are at high risk for breast cancer, talk to your health care provider about having an MRI and a mammogram every year.  Breast cancer gene (BRCA) assessment is recommended for women who have family members with BRCA-related cancers. BRCA-related cancers include: ? Breast. ? Ovarian. ? Tubal. ? Peritoneal cancers.  Results of the assessment will determine the need for genetic counseling and BRCA1 and BRCA2 testing.  Cervical Cancer Your health care provider may recommend that you be screened regularly for cancer of the pelvic organs (ovaries, uterus, and vagina). This screening involves a pelvic examination, including checking for microscopic changes to the surface of your cervix (Pap test). You may be encouraged to have this screening done every 3 years, beginning at age 44.  For women ages 54-65, health care providers may recommend pelvic exams and Pap testing every 3 years, or they may recommend the Pap and pelvic exam, combined with testing for human papilloma virus (HPV), every 5 years. Some types of HPV increase your risk of cervical cancer. Testing for HPV may also be done on women of any age with unclear Pap test results.  Other health care providers may not recommend any screening for nonpregnant women who are considered low risk for pelvic cancer and who do not  have symptoms. Ask your health care provider if a screening pelvic exam is right for you.  If you have had past treatment for cervical cancer or a condition that could lead to cancer, you need Pap tests and screening for cancer for at least 20 years after your treatment. If Pap tests have been discontinued, your risk factors (such as  having a new sexual partner) need to be reassessed to determine if screening should resume. Some women have medical problems that increase the chance of getting cervical cancer. In these cases, your health care provider may recommend more frequent screening and Pap tests.  Colorectal Cancer  This type of cancer can be detected and often prevented.  Routine colorectal cancer screening usually begins at 69 years of age and continues through 69 years of age.  Your health care provider may recommend screening at an earlier age if you have risk factors for colon cancer.  Your health care provider may also recommend using home test kits to check for hidden blood in the stool.  A small camera at the end of a tube can be used to examine your colon directly (sigmoidoscopy or colonoscopy). This is done to check for the earliest forms of colorectal cancer.  Routine screening usually begins at age 51.  Direct examination of the colon should be repeated every 5-10 years through 69 years of age. However, you may need to be screened more often if early forms of precancerous polyps or small growths are found.  Skin Cancer  Check your skin from head to toe regularly.  Tell your health care provider about any new moles or changes in moles, especially if there is a change in a mole's shape or color.  Also tell your health care provider if you have a mole that is larger than the size of a pencil eraser.  Always use sunscreen. Apply sunscreen liberally and repeatedly throughout the day.  Protect yourself by wearing long sleeves, pants, a wide-brimmed hat, and sunglasses whenever you are outside.  Heart disease, diabetes, and high blood pressure  High blood pressure causes heart disease and increases the risk of stroke. High blood pressure is more likely to develop in: ? People who have blood pressure in the high end of the normal range (130-139/85-89 mm Hg). ? People who are overweight or  obese. ? People who are African American.  If you are 88-29 years of age, have your blood pressure checked every 3-5 years. If you are 70 years of age or older, have your blood pressure checked every year. You should have your blood pressure measured twice-once when you are at a hospital or clinic, and once when you are not at a hospital or clinic. Record the average of the two measurements. To check your blood pressure when you are not at a hospital or clinic, you can use: ? An automated blood pressure machine at a pharmacy. ? A home blood pressure monitor.  If you are between 43 years and 2 years old, ask your health care provider if you should take aspirin to prevent strokes.  Have regular diabetes screenings. This involves taking a blood sample to check your fasting blood sugar level. ? If you are at a normal weight and have a low risk for diabetes, have this test once every three years after 69 years of age. ? If you are overweight and have a high risk for diabetes, consider being tested at a younger age or more often. Preventing infection Hepatitis B  If you have a higher risk for hepatitis B, you should be screened for this virus. You are considered at high risk for hepatitis B if: ? You were born in a country where hepatitis B is common. Ask your health care provider which countries are considered high risk. ? Your parents were born in a high-risk country, and you have not been immunized against hepatitis B (hepatitis B vaccine). ? You have HIV or AIDS. ? You use needles to inject street drugs. ? You live with someone who has hepatitis B. ? You have had sex with someone who has hepatitis B. ? You get hemodialysis treatment. ? You take certain medicines for conditions, including cancer, organ transplantation, and autoimmune conditions.  Hepatitis C  Blood testing is recommended for: ? Everyone born from [birth year] through 1963. ? Anyone with known risk factors for hepatitis  C.  Sexually transmitted infections (STIs)  You should be screened for sexually transmitted infections (STIs) including gonorrhea and chlamydia if: ? You are sexually active and are younger than 69 years of age. ? You are older than 69 years of age and your health care provider tells you that you are at risk for this type of infection. ? Your sexual activity has changed since you were last screened and you are at an increased risk for chlamydia or gonorrhea. Ask your health care provider if you are at risk.  If you do not have HIV, but are at risk, it may be recommended that you take a prescription medicine daily to prevent HIV infection. This is called pre-exposure prophylaxis (PrEP). You are considered at risk if: ? You are sexually active and do not regularly use condoms or know the HIV status of your partner(s). ? You take drugs by injection. ? You are sexually active with a partner who has HIV.  Talk with your health care provider about whether you are at high risk of being infected with HIV. If you choose to begin PrEP, you should first be tested for HIV. You should then be tested every 3 months for as long as you are taking PrEP. Pregnancy  If you are premenopausal and you may become pregnant, ask your health care provider about preconception counseling.  If you may become pregnant, take 400 to 800 micrograms (mcg) of folic acid every day.  If you want to prevent pregnancy, talk to your health care provider about birth control (contraception). Osteoporosis and menopause  Osteoporosis is a disease in which the bones lose minerals and strength with aging. This can result in serious bone fractures. Your risk for osteoporosis can be identified using a bone density scan.  If you are 4 years of age or older, or if you are at risk for osteoporosis and fractures, ask your health care provider if you should be screened.  Ask your health care provider whether you should take a calcium or  vitamin D supplement to lower your risk for osteoporosis.  Menopause may have certain physical symptoms and risks.  Hormone replacement therapy may reduce some of these symptoms and risks. Talk to your health care provider about whether hormone replacement therapy is right for you. Follow these instructions at home:  Schedule regular health, dental, and eye exams.  Stay current with your immunizations.  Do not use any tobacco products including cigarettes, chewing tobacco, or electronic cigarettes.  If you are pregnant, do not drink alcohol.  If you are breastfeeding, limit how much and how often you drink alcohol.  Limit alcohol  intake to no more than 1 drink per day for nonpregnant women. One drink equals 12 ounces of beer, 5 ounces of wine, or 1 ounces of hard liquor.  Do not use street drugs.  Do not share needles.  Ask your health care provider for help if you need support or information about quitting drugs.  Tell your health care provider if you often feel depressed.  Tell your health care provider if you have ever been abused or do not feel safe at home. This information is not intended to replace advice given to you by your health care provider. Make sure you discuss any questions you have with your health care provider. Document Released: 02/17/2011 Document Revised: 01/10/2016 Document Reviewed: 05/08/2015 Elsevier Interactive Patient Education  [employer].

## 2017-02-05 NOTE — Progress Notes (Signed)
Subjective:   Shelly Johnson is a 71 y.o. female who presents for an Initial Medicare Annual Wellness Visit.  Review of Systems    No ROS.  Medicare Wellness Visit. Additional risk factors are reflected in the social history.   Sleep patterns: Sleeps 7 hours. Does not feel rested, takes naps.  Home Safety/Smoke Alarms: Feels safe in home. Smoke alarms in place.  Living environment; residence and Firearm Safety: Lives alone in 2 story home. Sons live close.  Seat Belt Safety/Bike Helmet: Wears seat belt.   Counseling:   Eye Exam-Last exam 12/2015, yearly Dr. Ellie Lunch  Dental-Last exam 01/2016, yearly Dr. Ronnald Ramp  Female:   Pap-N/A      Mammo-01/10/2016, negative. Order placed.       Dexa scan-Order placed.        [facility]-Colonoscopy 12/16/2010, pt reports normal. Does not plan to repeat.       Objective:    Today's Vitals   02/05/17 1319  BP: 128/78  Pulse: 80  SpO2: 96%  Weight: 219 lb (99.3 kg)  Height: 5\' 2"  (1.575 m)   Body mass index is 40.06 kg/m.   Current Medications (verified) Outpatient Encounter Prescriptions as of 02/05/2017  Medication Sig  . aspirin 81 MG tablet Take 81 mg by mouth 3 (three) times a week. Pt takes 3 times a week  . chlorthalidone (HYGROTON) 25 MG tablet Take 1 tablet (25 mg total) by mouth daily.  . ferrous sulfate 325 (65 FE) MG tablet Take 1 tablet (325 mg total) by mouth 2 (two) times daily.  . naproxen sodium (ALEVE) 220 MG tablet Take 220 mg by mouth daily as needed (pain).   Marland Kitchen omeprazole (PRILOSEC) 40 MG capsule Take 1 capsule (40 mg total) by mouth daily.  . vitamin C (ASCORBIC ACID) 500 MG tablet Take 500 mg by mouth daily.    Marland Kitchen amoxicillin-clavulanate (AUGMENTIN) 875-125 MG tablet Take 1 tablet by mouth 2 (two) times daily. (Patient not taking: Reported on 02/05/2017)   No facility-administered encounter medications on file as of 02/05/2017.     Allergies (verified) Nsaids and Hydrocodone   History: Past Medical History:    Diagnosis Date  . Anemia   . Anxiety and depression    during loss of husband  . Arthritis   . Diverticulosis   . GERD (gastroesophageal reflux disease)   . GI bleed    hx of; hgb as low as 5.9  . Hemorrhoids   . Hiatal hernia   . Obesity   . Pneumonia   . Urinary tract bacterial infections    Past Surgical History:  Procedure Laterality Date  . ABDOMINAL HYSTERECTOMY     states had a "few cancer cells" but they "got everything". ? ovaries and cervix.   . APPENDECTOMY    . HIATAL HERNIA REPAIR     Family History  Problem Relation Age of Onset  . Arthritis Mother   . Cancer Mother        uterine  . Diabetes Brother   . Hypertension Brother   . Breast cancer Maternal Grandmother   . Colon cancer Maternal Aunt 96  . Colon polyps Neg Hx   . Esophageal cancer Neg Hx    Social History   Occupational History  . Retired    Social History Main Topics  . Smoking status: Never Smoker  . Smokeless tobacco: Never Used  . Alcohol use Yes     Comment: 1 per month  . Drug use: No  .  Sexual activity: Not on file    Tobacco Counseling Counseling given: Not Answered   Activities of Daily Living In your present state of health, do you have any difficulty performing the following activities: 02/05/2017  Hearing? N  Vision? N  Difficulty concentrating or making decisions? Y  Walking or climbing stairs? N  Dressing or bathing? N  Doing errands, shopping? N  Preparing Food and eating ? N  Using the Toilet? N  In the past six months, have you accidently leaked urine? N  Do you have problems with loss of bowel control? N  Managing your Medications? N  Managing your Finances? N  Housekeeping or managing your Housekeeping? N  Some recent data might be hidden    Immunizations and Health Maintenance Immunization History  Administered Date(s) Administered  . Influenza, High Dose Seasonal PF 07/04/2016  . Influenza,inj,Quad PF,36+ Mos 07/18/2013, 04/25/2014  . Pneumococcal  Conjugate-13 07/18/2013, 04/03/2015  . Pneumococcal Polysaccharide-23 07/04/2016  . Tdap 07/21/2013   Health Maintenance Due  Topic Date Due  . Hepatitis C Screening  March 26, 1946  . DEXA SCAN  01/14/2013    Patient Care Team: [name], MD as PCP - General (Family Medicine) Sherren Mocha, MD as Consulting Physician (Cardiology) Paralee Cancel, MD as Consulting Physician (Orthopedic Surgery)  Indicate any recent Medical Services you may have received from other than [facility] providers in the past year (date may be approximate).     Assessment:   This is a routine wellness examination for Shelly Johnson. Physical assessment deferred to PCP.   Hearing/Vision screen  Hearing Screening   Method: Audiometry   125Hz  250Hz  500Hz  1000Hz  2000Hz  3000Hz  4000Hz  6000Hz  8000Hz   Right ear:   Pass Pass Pass  Pass    Left ear:   Pass Pass Pass  Fail    Comments: Able to hear conversational tones w/o difficulty. No issues reported.    Vision Screening Comments: Wears glasses.   Dietary issues and exercise activities discussed: Current Exercise Habits: The patient does not participate in regular exercise at present, Exercise limited by: None identified   Diet (meal preparation, eat out, water intake, caffeinated beverages, dairy products, fruits and vegetables): Drinks tea (half/half), cranberry juice, club soda.   Breakfast: coffee, biscotti Lunch: sandwich, fast food, hotdogs, frozen dinner, nachos Dinner: protein, vegetables, pasta, frozen hamburger    Heart healthy diet discussed (DASH diet provided). Encouraged to become more active. Patient interested in referral to [facility] (Dr. Leafy Ro).   Goals    . Weight (lb) < 200 lb (90.7 kg)          Lose weight by increasing activity and eating healthier.       Depression Screen PHQ 2/9 Scores 02/05/2017 03/12/2016 11/27/2014  PHQ - 2 Score 0 0 0    Fall Risk Fall Risk  02/05/2017 03/12/2016 11/27/2014  Falls in the past  year? No No No   Patient denies depression. Continues to be emotional over loss of husband and recent loss of brother.   Cognitive Function:       Ad8 score reviewed for issues:  Issues making decisions: no  Less interest in hobbies / activities: no  Repeats questions, stories (family complaining): no  Trouble using ordinary gadgets (microwave, computer, phone): no  Forgets the month or year: no  Mismanaging finances: no  Remembering appts: no  Daily problems with thinking and/or memory:no Ad8 score is=0     Screening Tests Health Maintenance  Topic Date Due  . Hepatitis C  Screening  05-31-1946  . DEXA SCAN  01/14/2013  . INFLUENZA VACCINE  03/18/2017  . MAMMOGRAM  01/08/2018  . COLONOSCOPY  03/13/2018  . TETANUS/TDAP  07/22/2023  . PNA vac Low Risk Adult  Completed  Mammogram and DEXA scan ordered today.     Plan:    Bring a copy of your advance directives to your next office visit.  Continue doing brain stimulating activities (puzzles, reading, adult coloring books, staying active) to keep memory sharp.   Schedule mammogram and bone scan  I have personally reviewed and noted the following in the patient's chart:   . Medical and social history . Use of alcohol, tobacco or illicit drugs  . Current medications and supplements . Functional ability and status . Nutritional status . Physical activity . Advanced directives . List of other physicians . Hospitalizations, surgeries, and ER visits in previous 12 months . Vitals . Screenings to include cognitive, depression, and falls . Referrals and appointments  In addition, I have reviewed and discussed with patient certain preventive protocols, quality metrics, and best practice recommendations. A written personalized care plan for preventive services as well as general preventive health recommendations were provided to patient.     Gerilyn Nestle, RN   02/05/2017    PCP Notes: -Mammo/DEXA ordered  today.  -Postponing Hep C screening  -Pt requesting referral to [facility] (Dr. Leafy Ro), will send phone note.

## 2017-03-02 ENCOUNTER — Other Ambulatory Visit: Payer: Self-pay | Admitting: Family Medicine

## 2017-03-02 DIAGNOSIS — Z1239 Encounter for other screening for malignant neoplasm of breast: Secondary | ICD-10-CM

## 2017-03-16 DIAGNOSIS — H52203 Unspecified astigmatism, bilateral: Secondary | ICD-10-CM | POA: Diagnosis not present

## 2017-03-16 DIAGNOSIS — H2513 Age-related nuclear cataract, bilateral: Secondary | ICD-10-CM | POA: Diagnosis not present

## 2017-05-06 ENCOUNTER — Ambulatory Visit
Admission: RE | Admit: 2017-05-06 | Discharge: 2017-05-06 | Disposition: A | Discharge status: Home / Self Care | Payer: Medicare Other | Source: Ambulatory Visit | Attending: Family Medicine | Admitting: Family Medicine

## 2017-05-06 DIAGNOSIS — Z13820 Encounter for screening for osteoporosis: Secondary | ICD-10-CM | POA: Diagnosis not present

## 2017-05-06 DIAGNOSIS — Z1231 Encounter for screening mammogram for malignant neoplasm of breast: Secondary | ICD-10-CM | POA: Diagnosis not present

## 2017-05-06 DIAGNOSIS — Z1239 Encounter for other screening for malignant neoplasm of breast: Secondary | ICD-10-CM

## 2017-05-06 DIAGNOSIS — Z780 Asymptomatic menopausal state: Secondary | ICD-10-CM | POA: Diagnosis not present

## 2017-05-06 DIAGNOSIS — E2839 Other primary ovarian failure: Secondary | ICD-10-CM

## 2017-06-19 ENCOUNTER — Encounter: Payer: Self-pay | Admitting: Family Medicine

## 2017-06-19 ENCOUNTER — Ambulatory Visit (INDEPENDENT_AMBULATORY_CARE_PROVIDER_SITE_OTHER): Payer: Medicare Other | Admitting: Family Medicine

## 2017-06-19 VITALS — BP 130/84 | HR 80 | Temp 98.2°F | Ht 62.0 in | Wt 219.6 lb

## 2017-06-19 DIAGNOSIS — Z23 Encounter for immunization: Secondary | ICD-10-CM

## 2017-06-19 DIAGNOSIS — F321 Major depressive disorder, single episode, moderate: Secondary | ICD-10-CM

## 2017-06-19 DIAGNOSIS — K449 Diaphragmatic hernia without obstruction or gangrene: Secondary | ICD-10-CM | POA: Diagnosis not present

## 2017-06-19 DIAGNOSIS — K219 Gastro-esophageal reflux disease without esophagitis: Secondary | ICD-10-CM

## 2017-06-19 DIAGNOSIS — R1013 Epigastric pain: Secondary | ICD-10-CM

## 2017-06-19 LAB — CBC WITH DIFFERENTIAL/PLATELET
BASOS PCT: 0.6 % (ref 0.0–3.0)
Basophils Absolute: 0 10*3/uL (ref 0.0–0.1)
EOS PCT: 2 % (ref 0.0–5.0)
Eosinophils Absolute: 0.1 10*3/uL (ref 0.0–0.7)
HEMATOCRIT: 48.6 % — AB (ref 36.0–46.0)
HEMOGLOBIN: 15.6 g/dL — AB (ref 12.0–15.0)
LYMPHS PCT: 39.3 % (ref 12.0–46.0)
Lymphs Abs: 2.1 10*3/uL (ref 0.7–4.0)
MCHC: 32.1 g/dL (ref 30.0–36.0)
MCV: 94.1 fl (ref 78.0–100.0)
MONO ABS: 0.3 10*3/uL (ref 0.1–1.0)
Monocytes Relative: 6.5 % (ref 3.0–12.0)
NEUTROS ABS: 2.8 10*3/uL (ref 1.4–7.7)
Neutrophils Relative %: 51.6 % (ref 43.0–77.0)
PLATELETS: 187 10*3/uL (ref 150.0–400.0)
RBC: 5.16 Mil/uL — ABNORMAL HIGH (ref 3.87–5.11)
RDW: 14 % (ref 11.5–15.5)
WBC: 5.4 10*3/uL (ref 4.0–10.5)

## 2017-06-19 LAB — COMPREHENSIVE METABOLIC PANEL
ALK PHOS: 48 U/L (ref 39–117)
ALT: 31 U/L (ref 0–35)
AST: 20 U/L (ref 0–37)
Albumin: 4.1 g/dL (ref 3.5–5.2)
BILIRUBIN TOTAL: 0.5 mg/dL (ref 0.2–1.2)
BUN: 16 mg/dL (ref 6–23)
CALCIUM: 9.4 mg/dL (ref 8.4–10.5)
CO2: 33 meq/L — AB (ref 19–32)
Chloride: 101 mEq/L (ref 96–112)
Creatinine, Ser: 0.81 mg/dL (ref 0.40–1.20)
GFR: 74.42 mL/min (ref >60.00)
Glucose, Bld: 113 mg/dL — ABNORMAL HIGH (ref 70–99)
Potassium: 4.4 mEq/L (ref 3.5–5.1)
Sodium: 140 mEq/L (ref 135–145)
TOTAL PROTEIN: 6.7 g/dL (ref 6.0–8.3)

## 2017-06-19 LAB — LIPASE: Lipase: 24 U/L (ref 11.0–59.0)

## 2017-06-19 MED ORDER — OMEPRAZOLE 40 MG PO CPDR
40.0000 mg | DELAYED_RELEASE_CAPSULE | Freq: Every day | ORAL | 3 refills | Status: DC
Start: 2017-06-19 — End: 2017-08-27

## 2017-06-19 NOTE — Progress Notes (Signed)
Subjective:  Shelly Johnson is a 71 y.o. year old very pleasant female patient who presents for/with See problem oriented charting ROS- denies chest pain. Has stable shortness of breath previously discussed and even seen cardiology for. Increased stress. No SI.  Has some anhedonia and depressed mood.   Past Medical History-  Patient Active Problem List   Diagnosis Date Noted  . Essential hypertension 08/23/2016    Priority: Medium  . ANEMIA-NOS 03/16/2008    Priority: Medium  . ANXIETY DEPRESSION 03/08/2008    Priority: Medium  . Hiatal hernia with gastroesophageal reflux 03/08/2008    Priority: Low  . Depression, major, single episode, moderate ([place]) 06/20/2017  . Dyspnea on exertion 08/23/2016    Medications- reviewed and updated Current Outpatient Prescriptions  Medication Sig Dispense Refill  . aspirin 81 MG tablet Take 81 mg by mouth 3 (three) times a week. Pt takes 3 times a week    . chlorthalidone (HYGROTON) 25 MG tablet Take 1 tablet (25 mg total) by mouth daily. 90 tablet 3  . ferrous sulfate 325 (65 FE) MG tablet Take 1 tablet (325 mg total) by mouth 2 (two) times daily. 60 tablet 3  . ibuprofen (ADVIL,MOTRIN) 200 MG tablet Take 200 mg by mouth every 6 (six) hours as needed.    . naproxen sodium (ALEVE) 220 MG tablet Take 220 mg by mouth daily as needed (pain).     Marland Kitchen omeprazole (PRILOSEC) 40 MG capsule Take 1 capsule (40 mg total) by mouth daily. 30 capsule 3  . vitamin C (ASCORBIC ACID) 500 MG tablet Take 500 mg by mouth daily.       Objective: BP 130/84 (BP Location: Left Arm, Patient Position: Sitting, Cuff Size: Large)   Pulse 80   Temp 98.2 F (36.8 C) (Oral)   Ht 5\' 2"  (1.575 m)   Wt 219 lb 9.6 oz (99.6 kg)   SpO2 94%   BMI 40.17 kg/m  Gen: NAD, resting comfortably CV: RRR no murmurs rubs or gallops Lungs: CTAB no crackles, wheeze, rhonchi Abdomen: soft/mild tenderness in epigastric area/nondistended/normal bowel sounds. No rebound or guarding. Morbidly  obese Ext: no edema Skin: warm, dry  Assessment/Plan:  Epigastric abdominal pain - Plan: CBC with Differential/Platelet, Comprehensive metabolic panel, Lipase, Ambulatory referral to Gastroenterology Hiatal hernia with gastroesophageal reflux - Plan: Ambulatory referral to Gastroenterology S:  patient with hiatal hernia with reflux dating to at least 07/18/13- reviewed Dr. Leanne Chang notes. History of from record "#1. Recurrent iron deficiency anemia. 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 Having belching as well as intense burning in epigastric area. She has been taking omeprazole 20mg  OTC from [facility]- has been consistent for years with this. Current pain for several months.   Gets nauseous and has spit up with this. Aunt with esophageal cancer last year and so has her very concerned. Continued fatigue issues.   A/P: Recurrent abdominal pain. Need to get CBC to rule out anemia. CMP and lipase given location of pain. Suspect this is uncontrolled GERD with agitation related to hiatal hernia.  With fatigue- could update tsh in future. With prior cameron erosions and worsening pain- also refer back to GI- for their opinion on updated EGD  Depression, major, single episode, moderate ([place]) S: Down since loss of husband 2008. Watching tv, wont exercise, no motivation.worse over last few months. States lots of stressors and life changes Depression screen [facility] 2/9  06/19/2017 02/05/2017 03/12/2016 11/27/2014  Decreased Interest 1 0 0 0  Down, Depressed, Hopeless 1 0 0 0  PHQ - 2 Score 2 0 0 0  Altered sleeping 0 - - -  Tired, decreased energy 3 - - -  Change in appetite 2 - - -  Feeling bad or failure about yourself  1 - - -  Trouble concentrating 3 - - -  Moving slowly or fidgety/restless 0 - - -  Suicidal thoughts 0 - - -  PHQ-9 Score 11 - - -  Difficult doing work/chores Somewhat  difficult - - -  A/P: discussed with patient at minimum would have her see behavioral health. She is very resistant. Discussed medication and she firmly declines any SSRI. Discussed with her risks of depression including suicide risk- she agrees to reach out if any worsening symptoms immediately. I encouraged her to check back in with me even if she does not see behavioral health  Orders Placed This Encounter  Procedures  . Flu vaccine HIGH DOSE PF  . CBC with Differential/Platelet  . Comprehensive metabolic panel    [place]  . Lipase  . Ambulatory referral to Gastroenterology    Referral Priority:   Routine    Referral Type:   Consultation    Referral Reason:   Specialty Services Required    Number of Visits Requested:   1   Meds ordered this encounter  Medications  . ibuprofen (ADVIL,MOTRIN) 200 MG tablet    Sig: Take 200 mg by mouth every 6 (six) hours as needed.  Marland Kitchen omeprazole (PRILOSEC) 40 MG capsule    Sig: Take 1 capsule (40 mg total) by mouth daily.    Dispense:  30 capsule    Refill:  3   The duration of face-to-face time during this visit was greater than 25 minutes. Greater than 50% of this time was spent in counseling, explanation of diagnosis, planning of further management, and/or coordination of care including discussion of depression, counseling, medications as well as counseling on stressors she has been dealing with.    Return precautions advised.  Garret Reddish, MD

## 2017-06-19 NOTE — Patient Instructions (Addendum)
Please stop by lab before you go  We will call you within a week or two about your referral to Gi. If you do not hear within 3 weeks, give [place] a call.   See [place] back for new or worsening symptoms particularly chest pain or fever or worsening shortness of breath

## 2017-06-20 DIAGNOSIS — F321 Major depressive disorder, single episode, moderate: Secondary | ICD-10-CM | POA: Insufficient documentation

## 2017-06-20 DIAGNOSIS — Z8659 Personal history of other mental and behavioral disorders: Secondary | ICD-10-CM | POA: Insufficient documentation

## 2017-06-20 NOTE — Assessment & Plan Note (Signed)
S: Down since loss of husband 2008. Watching tv, wont exercise, no motivation.worse over last few months. States lots of stressors and life changes Depression screen [facility] 2/9 06/19/2017 02/05/2017 03/12/2016 11/27/2014  Decreased Interest 1 0 0 0  Down, Depressed, Hopeless 1 0 0 0  PHQ - 2 Score 2 0 0 0  Altered sleeping 0 - - -  Tired, decreased energy 3 - - -  Change in appetite 2 - - -  Feeling bad or failure about yourself  1 - - -  Trouble concentrating 3 - - -  Moving slowly or fidgety/restless 0 - - -  Suicidal thoughts 0 - - -  PHQ-9 Score 11 - - -  Difficult doing work/chores Somewhat difficult - - -  A/P: discussed with patient at minimum would have her see behavioral health. She is very resistant. Discussed medication and she firmly declines any SSRI. Discussed with her risks of depression including suicide risk- she agrees to reach out if any worsening symptoms immediately. I encouraged her to check back in with me even if she does not see behavioral health

## 2017-08-19 ENCOUNTER — Ambulatory Visit: Payer: Medicare Other | Admitting: Internal Medicine

## 2017-08-24 ENCOUNTER — Encounter: Payer: Self-pay | Admitting: Family Medicine

## 2017-08-25 ENCOUNTER — Telehealth: Payer: Self-pay

## 2017-08-25 NOTE — Telephone Encounter (Signed)
Called patient and left a voicemail message asking patient to call   [facility] [place], [place]  [address] Phone:  [phone]   Fax:  [phone]  And reschedule her appointment or she can call our office at [phone] to let us know if her symptoms have resolved.

## 2017-08-27 ENCOUNTER — Telehealth: Payer: Self-pay | Admitting: Family Medicine

## 2017-08-27 ENCOUNTER — Other Ambulatory Visit: Payer: Self-pay

## 2017-08-27 ENCOUNTER — Ambulatory Visit (INDEPENDENT_AMBULATORY_CARE_PROVIDER_SITE_OTHER): Payer: Medicare Other | Admitting: Psychology

## 2017-08-27 DIAGNOSIS — F4323 Adjustment disorder with mixed anxiety and depressed mood: Secondary | ICD-10-CM | POA: Diagnosis not present

## 2017-08-27 MED ORDER — OMEPRAZOLE 40 MG PO CPDR: 40.0000 mg | DELAYED_RELEASE_CAPSULE | Freq: Every day | ORAL | 3 refills | Status: DC

## 2017-08-27 MED ORDER — CHLORTHALIDONE 25 MG PO TABS: 25.0000 mg | ORAL_TABLET | Freq: Every day | ORAL | 3 refills | Status: DC

## 2017-08-27 NOTE — Telephone Encounter (Signed)
Copied from [place] [id number]. Topic: Quick Communication - Rx Refill/Question >> Aug 27, 2017 10:35 AM Robina Ade, Helene Kelp D wrote: Medication: chlorthalidone (HYGROTON) 25 MG tablet and omeprazole (PRILOSEC) 40 MG capsule .  Has the patient contacted their pharmacy? Yes Patient would like a 3 month supply for refill.  (Agent: If no, request that the patient contact the pharmacy for the refill.)   Preferred Pharmacy (with phone number or street name): [id number] - [place], [place]: Please be advised that RX refills may take up to 3 business days. We ask that you follow-up with your pharmacy.

## 2017-09-02 ENCOUNTER — Ambulatory Visit (INDEPENDENT_AMBULATORY_CARE_PROVIDER_SITE_OTHER): Payer: Medicare Other | Admitting: Internal Medicine

## 2017-09-02 ENCOUNTER — Encounter: Payer: Self-pay | Admitting: Internal Medicine

## 2017-09-02 VITALS — BP 142/88 | HR 92 | Ht 62.0 in | Wt 220.0 lb

## 2017-09-02 DIAGNOSIS — R112 Nausea with vomiting, unspecified: Secondary | ICD-10-CM

## 2017-09-02 DIAGNOSIS — R1013 Epigastric pain: Secondary | ICD-10-CM

## 2017-09-02 DIAGNOSIS — K219 Gastro-esophageal reflux disease without esophagitis: Secondary | ICD-10-CM | POA: Diagnosis not present

## 2017-09-02 DIAGNOSIS — R159 Full incontinence of feces: Secondary | ICD-10-CM | POA: Diagnosis not present

## 2017-09-02 DIAGNOSIS — Z1211 Encounter for screening for malignant neoplasm of colon: Secondary | ICD-10-CM | POA: Diagnosis not present

## 2017-09-02 NOTE — Progress Notes (Signed)
HISTORY OF PRESENT ILLNESS:  Shelly Johnson is a 72 y.o. female who is self-referred today with chief complaints of recurrent nausea with vomiting, worsening reflux symptoms, epigastric pain, and occasional fecal incontinence. Patient has a history of GERD as well as iron deficiency anemia secondary to hiatal hernia with associated Cameron erosions. Last seen in this office November 2015 regarding recurrent iron deficiency anemia secondary to the same, GERD, and lower abdominal complaints secondary to aggressive dietary supplement program. See that dictation for details. Since that time the patient has been compliant with iron therapy. Review of outside laboratories shows consistently normal hemoglobin. Last hemoglobin November 2018 was 15.6. She has gained 20 pounds since her last visit. She reports at least 1 month of worsening reflux symptoms for which her omeprazole was increased from 20 mg daily to 40 mg daily. However, she has not been compliant regular basis. Over the past week she describes epigastric discomfort, pyrosis, and recurrent episodes of postprandial vomiting or dictation. Generalized fatigue as well. Occasional incontinence with Valsalva maneuver. Her last upper endoscopy with Dr. Collene Mares April 2012 demonstrated small antral ulcer and hiatal hernia with Lysbeth Galas erosions. I performed her last colonoscopy in July 2009. This revealed left-sided diverticulosis but was otherwise normal. Hemorrhoids noted. Patient's GI review of systems is also remarkable for belching and bloating. No hematemesis or melena. No hematochezia.  REVIEW OF SYSTEMS:  All non-GI ROS negative unless otherwise stated in the history of present illness except for arthritis, cough, fatigue, headaches, muscle cramps, urinary leakage, shortness of breath, ankle swelling  Past Medical History:  Diagnosis Date  . Anemia   . Anxiety and depression    during loss of husband  . Arthritis   . Diverticulosis   . GERD  (gastroesophageal reflux disease)   . GI bleed    hx of; hgb as low as 5.9  . Hemorrhoids   . Hiatal hernia   . Obesity   . Pneumonia   . Urinary tract bacterial infections     Past Surgical History:  Procedure Laterality Date  . ABDOMINAL HYSTERECTOMY     states had a "few cancer cells" but they "got everything". ? ovaries and cervix.   . APPENDECTOMY    . HIATAL HERNIA REPAIR      Social History Shelly Johnson  reports that  has never smoked. she has never used smokeless tobacco. She reports that she drinks alcohol. She reports that she does not use drugs.  family history includes Arthritis in her mother; Breast cancer in her maternal grandmother; Colon cancer (age of onset: 66) in her maternal aunt; Diabetes in her brother; Hypertension in her brother; Uterine cancer in her mother.  Allergies  Allergen Reactions  . Nsaids     Should avoid-- GI bleed  . Hydrocodone Nausea And Vomiting       PHYSICAL EXAMINATION: Vital signs: BP (!) 142/88   Pulse 92   Ht 5\' 2"  (1.575 m)   Wt 220 lb (99.8 kg)   BMI 40.24 kg/m   Constitutional: generally well-appearing, no acute distress Psychiatric: alert and oriented x3, cooperative Eyes: extraocular movements intact, anicteric, conjunctiva pink Mouth: oral pharynx moist, no lesions Neck: supple no lymphadenopathy Cardiovascular: heart regular rate and rhythm, no murmur Lungs: clear to auscultation bilaterally Abdomen: soft, obese, nontender, nondistended, no obvious ascites, no peritoneal signs, normal bowel sounds, no organomegaly Rectal: Omitted Extremities: no clubbing, cyanosis, or lower extremity edema bilaterally Skin: no lesions on visible extremities Neuro: No focal deficits. Cranial  nerves intact  ASSESSMENT:  #1. GERD. Exacerbated #2. Large hiatal hernia with Lysbeth Galas erosions #3. Nausea and vomiting. Suspect secondary to GERD. Rule out gallbladder #4. History of iron deficiency anemia secondary to [facility] erosions.  Normal hemoglobin on chronic iron replacement #5. Obesity #6. Minor fecal incontinence #7. Colonoscopy 2009 with diverticulosis   PLAN:  #1. Reflux precautions reviewed. #2. Weight loss critical importance. Stressed #3. Continue adjusted dose of PPI with omeprazole at 40 mg daily. Daily compliance and proper timing of dosage reviewed #4. Recommended EGD and ultrasound for further evaluation. Patient wishes to hold off at this time #5. Due for screening colonoscopy. Advised. The patient wishes to hold off #6. Fiber supplementation  #7. Protective undergarments when necessary  #6. Office follow-up in 2 months. Contact the office in the interim if necessary. The patient agrees. Consider further workup at that time if she is still symptomatic   45 Minutes spent face-to-face with the patient. Greater than 50% a time use for counseling regarding worsening GERD, problems with nausea and vomiting, epigastric discomfort, and incontinence

## 2017-09-02 NOTE — Patient Instructions (Signed)
Take your PPI daily as discussed with Dr. Henrene Pastor  Follow the reflux precaution guide

## 2017-12-03 ENCOUNTER — Ambulatory Visit (INDEPENDENT_AMBULATORY_CARE_PROVIDER_SITE_OTHER): Payer: Medicare Other | Admitting: Psychology

## 2017-12-03 DIAGNOSIS — F411 Generalized anxiety disorder: Secondary | ICD-10-CM | POA: Diagnosis not present

## 2018-01-07 ENCOUNTER — Ambulatory Visit (INDEPENDENT_AMBULATORY_CARE_PROVIDER_SITE_OTHER): Payer: Medicare Other | Admitting: Psychology

## 2018-01-07 DIAGNOSIS — F411 Generalized anxiety disorder: Secondary | ICD-10-CM | POA: Diagnosis not present

## 2018-01-21 ENCOUNTER — Encounter: Payer: Self-pay | Admitting: Family Medicine

## 2018-01-21 ENCOUNTER — Ambulatory Visit (INDEPENDENT_AMBULATORY_CARE_PROVIDER_SITE_OTHER): Payer: Medicare Other | Admitting: Family Medicine

## 2018-01-21 VITALS — BP 124/84 | HR 88 | Temp 98.2°F | Ht 62.0 in | Wt 218.8 lb

## 2018-01-21 DIAGNOSIS — I10 Essential (primary) hypertension: Secondary | ICD-10-CM | POA: Diagnosis not present

## 2018-01-21 DIAGNOSIS — R200 Anesthesia of skin: Secondary | ICD-10-CM | POA: Diagnosis not present

## 2018-01-21 DIAGNOSIS — R5383 Other fatigue: Secondary | ICD-10-CM

## 2018-01-21 DIAGNOSIS — K219 Gastro-esophageal reflux disease without esophagitis: Secondary | ICD-10-CM | POA: Diagnosis not present

## 2018-01-21 DIAGNOSIS — E538 Deficiency of other specified B group vitamins: Secondary | ICD-10-CM

## 2018-01-21 DIAGNOSIS — F321 Major depressive disorder, single episode, moderate: Secondary | ICD-10-CM | POA: Diagnosis not present

## 2018-01-21 DIAGNOSIS — R202 Paresthesia of skin: Secondary | ICD-10-CM

## 2018-01-21 DIAGNOSIS — K449 Diaphragmatic hernia without obstruction or gangrene: Secondary | ICD-10-CM

## 2018-01-21 NOTE — Assessment & Plan Note (Deleted)
S: Elevated PHQ 9 at last visit-she declined medication and behavioral health counseling  Has seen Dr. Glennon Hamilton 2-3 visits. Is going to go to at least one more visit.  PHQ 9 remains elevated at 11 today.  1 of the biggest issues from her depression is low motivation- has a hard time engaging socially, going to church, exercising-all 3 are important goals for her and she asks how to get started on them A/P: I discussed medication options with patient again-she firmly declines.  She does at least agree to continue to work with Dr. Pearline Cables of these 1 more visit- unfortunately she states she has not had significant improvement with the sessions though she enjoys them.  I encouraged 1 to 28-month follow-up to check in a little closer with patient.  I am hopeful she follows through with this

## 2018-01-21 NOTE — Patient Instructions (Addendum)
Please stop by lab before you go  You may stop your aspirin.  Please call GI for follow-up  Please see me back in 1 month.  If you were to start exercising, improve your diet-and this results in weight loss-we may be able to reduce or stop your blood pressure medicine over time  Continue to follow-up with Dr. Glennon Hamilton

## 2018-01-21 NOTE — Assessment & Plan Note (Signed)
S: Elevated PHQ 9 at last visit-she declined medication and behavioral health counseling  Has seen Dr. Glennon Hamilton 2-3 visits. Is going to go to at least one more visit.  PHQ 9 remains elevated at 11 today.  1 of the biggest issues from her depression is low motivation- has a hard time engaging socially, going to church, exercising-all 3 are important goals for her and she asks how to get started on them A/P: I discussed medication options with patient again-she firmly declines.  She does at least agree to continue to work with Dr. Glennon Hamilton of these 1 more visit- unfortunately she states she has not had significant improvement with the sessions though she enjoys them.  I encouraged 1 to 37-month follow-up to check in a little closer with patient.  I am hopeful she follows through with this

## 2018-01-21 NOTE — Progress Notes (Signed)
Subjective:  Shelly Johnson is a 72 y.o. year old very pleasant female patient who presents for/with See problem oriented charting ROS-admits to low motivation, depressed mood, anhedonia.  No suicidal ideation.  Denies increased shortness of breath.  Denies increased edema.  Endorses sadness of the loss of husband over 10 years ago  Past Medical History-  Patient Active Problem List   Diagnosis Date Noted  . Depression, major, single episode, moderate ([place]) 06/20/2017    Priority: Medium  . Essential hypertension 08/23/2016    Priority: Medium  . ANEMIA-NOS 03/16/2008    Priority: Medium  . Hiatal hernia with gastroesophageal reflux 03/08/2008    Priority: Low  . Numbness and tingling of both feet 01/21/2018  . Dyspnea on exertion 08/23/2016    Medications- reviewed and updated. Asa prior to visit Current Outpatient Medications  Medication Sig Dispense Refill  . chlorthalidone (HYGROTON) 25 MG tablet Take 1 tablet (25 mg total) by mouth daily. 90 tablet 3  . ferrous sulfate 325 (65 FE) MG tablet Take 1 tablet (325 mg total) by mouth 2 (two) times daily. 60 tablet 3  . omeprazole (PRILOSEC) 40 MG capsule Take 1 capsule (40 mg total) by mouth daily. 30 capsule 3  . vitamin C (ASCORBIC ACID) 500 MG tablet Take 500 mg by mouth daily.       No current facility-administered medications for this visit.     Objective: BP 124/84 (BP Location: Left Arm, Patient Position: Sitting, Cuff Size: Large)   Pulse 88   Temp 98.2 F (36.8 C) (Oral)   Ht 5\' 2"  (1.575 m)   Wt 218 lb 12.8 oz (99.2 kg)   SpO2 95%   BMI 40.02 kg/m  Gen: NAD, resting comfortably CV: RRR no murmurs rubs or gallops Lungs: CTAB no crackles, wheeze, rhonchi Abdomen: soft/nontender/nondistended/normal bowel sounds. No rebound or guarding.  Ext: trace edema Skin: warm, dry  Assessment/Plan:  Other notes: 1.  We discussed she can stop aspirin  Essential hypertension S: controlled on chlorthalidone 25 mg BP  Readings from Last 3 Encounters:  01/21/18 124/84  09/02/17 (!) 142/88  06/19/17 130/84  A/P: We discussed blood pressure goal of <140/90. Continue current meds:  She has some numbness in hands and feet and wants to attribute that to chlorthalidone> I instead encouraged her to get some basic labs to see if obvious causes of neuropathy.   Depression, major, single episode, moderate ([facility]) S: Elevated PHQ 9 at last visit-she declined medication and behavioral health counseling  Has seen Dr. Glennon Hamilton 2-3 visits. Is going to go to at least one more visit.  PHQ 9 remains elevated at 11 today.  1 of the biggest issues from her depression is low motivation- has a hard time engaging socially, going to church, exercising-all 3 are important goals for her and she asks how to get started on them A/P: I discussed medication options with patient again-she firmly declines.  She does at least agree to continue to work with Dr. Glennon Hamilton of these 1 more visit- unfortunately she states she has not had significant improvement with the sessions though she enjoys them.  I encouraged 1 to 48-month follow-up to check in a little closer with patient.  I am hopeful she follows through with this  Hiatal hernia with gastroesophageal reflux S: Referred to GI last visit due to epigastric pain after meals and failure to improve on omeprazole 20 mg over-the-counter.  She is also experiencing nausea and spitting up with this.  Has  a history of esophageal cancer so she was very concerned about that potential risk.  She saw GI and she was continued on the 40 mg of omeprazole we have increased her to.  We have discussed importance of weight loss.  Patient declined advised on anoscopy.  Patient was advised to month follow-up-she did not follow-up at that time.   A/P: Continued epigastric pain despite PPI.  Dr.  Dolores Patty follow-up in March and patient has not followed up I strongly encouraged her-to schedule follow-up at this  time -taking aleve once a week- advised to cut out until sees Dr. Henrene Pastor continued epigastric pain  Numbness and tingling of both feet S: some numbness in both hands and both feet- she wants to blame BP medication A/P:possible neuropathy. Will get some basic labs. Also has fatigue so will get some basic labs for that as well.    Future Appointments  Date Time Provider [place]  03/04/2018  1:00 PM Williemae Area, RN [facility]  She did not schedule follow-up with me as directed-at least she is scheduled for a wellness visit next month and if symptoms of depression not improving can be referred back to me again  Lab/Order associations: Fatigue, unspecified type - Plan: CBC, Comprehensive metabolic panel, TSH, Vitamin B12  B12 deficiency (anemia - and placed on b12 supplement in past) - Plan: Vitamin B12  Return precautions advised.  Garret Reddish, MD

## 2018-01-21 NOTE — Assessment & Plan Note (Signed)
S: controlled on chlorthalidone 25 mg BP Readings from Last 3 Encounters:  01/21/18 124/84  09/02/17 (!) 142/88  06/19/17 130/84  A/P: We discussed blood pressure goal of <140/90. Continue current meds:  She has some numbness in hands and feet and wants to attribute that to chlorthalidone> I instead encouraged her to get some basic labs to see if obvious causes of neuropathy.

## 2018-01-21 NOTE — Assessment & Plan Note (Signed)
S: some numbness in both hands and both feet- she wants to blame BP medication A/P:possible neuropathy. Will get some basic labs. Also has fatigue so will get some basic labs for that as well.

## 2018-01-21 NOTE — Assessment & Plan Note (Signed)
S: Referred to GI last visit due to epigastric pain after meals and failure to improve on omeprazole 20 mg over-the-counter.  She is also experiencing nausea and spitting up with this.  Has a history of esophageal cancer so she was very concerned about that potential risk.  She saw GI and she was continued on the 40 mg of omeprazole we have increased her to.  We have discussed importance of weight loss.  Patient declined advised on anoscopy.  Patient was advised to month follow-up-she did not follow-up at that time.   A/P: Continued epigastric pain despite PPI.  Dr.  Dolores Patty follow-up in March and patient has not followed up I strongly encouraged her-to schedule follow-up at this time -taking aleve once a week- advised to cut out until sees Dr. Henrene Pastor continued epigastric pain

## 2018-01-22 LAB — COMPREHENSIVE METABOLIC PANEL
ALT: 32 U/L (ref 0–35)
AST: 20 U/L (ref 0–37)
Albumin: 4.2 g/dL (ref 3.5–5.2)
Alkaline Phosphatase: 46 U/L (ref 39–117)
BILIRUBIN TOTAL: 0.4 mg/dL (ref 0.2–1.2)
BUN: 19 mg/dL (ref 6–23)
CALCIUM: 9.5 mg/dL (ref 8.4–10.5)
CHLORIDE: 100 meq/L (ref 96–112)
CO2: 30 meq/L (ref 19–32)
Creatinine, Ser: 1.09 mg/dL (ref 0.40–1.20)
GFR: 52.74 mL/min — AB (ref >60.00)
GLUCOSE: 92 mg/dL (ref 70–99)
Potassium: 4.1 mEq/L (ref 3.5–5.1)
Sodium: 141 mEq/L (ref 135–145)
Total Protein: 7.1 g/dL (ref 6.0–8.3)

## 2018-01-22 LAB — CBC
HEMATOCRIT: 46.9 % — AB (ref 36.0–46.0)
HEMOGLOBIN: 15.7 g/dL — AB (ref 12.0–15.0)
MCHC: 33.6 g/dL (ref 30.0–36.0)
MCV: 92.9 fl (ref 78.0–100.0)
PLATELETS: 196 10*3/uL (ref 150.0–400.0)
RBC: 5.05 Mil/uL (ref 3.87–5.11)
RDW: 13.8 % (ref 11.5–15.5)
WBC: 6.6 10*3/uL (ref 4.0–10.5)

## 2018-01-22 LAB — TSH: TSH: 2.4 u[IU]/mL (ref 0.35–4.50)

## 2018-01-22 LAB — VITAMIN B12: VITAMIN B 12: 588 pg/mL (ref 211–911)

## 2018-02-26 ENCOUNTER — Ambulatory Visit: Payer: Medicare Other | Admitting: Psychology

## 2018-03-03 NOTE — Progress Notes (Signed)
Subjective:   Shelly Johnson is a 72 y.o. female who presents for Medicare Annual/Subsequent preventive examination.  Reports health as good  C/o of OA  Very tired  Spouse 11 years ago on  The 15th.  Lost friends in the last 6 months, as well as a brother and a sister   Diet Was 101 in January 214lb  Chol/hdl 3; hdl 65;  A1c 6.2  Educated regarding pre-diabetes  Breakfast coffee; biscotti Lunch - chicken salad; out to lunch Biggest meal at lunch   Exercise To tired to exercise  Had exercised 5 days a week all of her life.  Still cleans her home  Never smoked   Health Maintenance Due  Topic Date Due  . Hepatitis C Screening  11/16/1945   Will defer hep c due to spending time with the patient on her grief  Colonoscopy 02/2008 - due 02/2018 - she chooses to postpone for now  Mammogram 04/2017 and due in Sept and will schedule  Dexa 04/2017 - normal         Objective:    Vitals: BP (!) 150/80   Pulse 74   Ht 5\' 1"  (1.549 m)   Wt 214 lb (97.1 kg)   SpO2 91%   BMI 40.43 kg/m   Body mass index is 40.43 kg/m.  Advanced Directives 02/05/2017  Does Patient Have a Medical Advance Directive? Yes  Type of Paramedic of [place];Living will  Copy of [place] in Chart? No - copy requested    Tobacco Social History   Tobacco Use  Smoking Status Never Smoker  Smokeless Tobacco Never Used     Counseling given: Yes   Clinical Intake:       Past Medical History:  Diagnosis Date  . Anemia   . Anxiety and depression    during loss of husband  . Arthritis   . Diverticulosis   . GERD (gastroesophageal reflux disease)   . GI bleed    hx of; hgb as low as 5.9  . Hemorrhoids   . Hiatal hernia   . Obesity   . Pneumonia   . Urinary tract bacterial infections    Past Surgical History:  Procedure Laterality Date  . ABDOMINAL HYSTERECTOMY     states had a "few cancer cells" but they "got everything". ? ovaries and  cervix.   . APPENDECTOMY    . HIATAL HERNIA REPAIR     Family History  Problem Relation Age of Onset  . Arthritis Mother   . Uterine cancer Mother        uterine  . Diabetes Brother   . Hypertension Brother   . Breast cancer Maternal Grandmother   . Colon cancer Maternal Aunt 96  . Colon polyps Neg Hx   . Esophageal cancer Neg Hx   . Stomach cancer Neg Hx    Social History   Socioeconomic History  . Marital status: Widowed    Spouse name: Not on file  . Number of children: 2  . Years of education: Not on file  . Highest education level: Not on file  Occupational History  . Occupation: Retired  Scientific laboratory technician  . Financial resource strain: Not on file  . Food insecurity:    Worry: Not on file    Inability: Not on file  . Transportation needs:    Medical: Not on file    Non-medical: Not on file  Tobacco Use  . Smoking status: Never Smoker  .  Smokeless tobacco: Never Used  Substance and Sexual Activity  . Alcohol use: Yes    Comment: 1 per month  . Drug use: No  . Sexual activity: Not on file  Lifestyle  . Physical activity:    Days per week: Not on file    Minutes per session: Not on file  . Stress: Not on file  Relationships  . Social connections:    Talks on phone: Not on file    Gets together: Not on file    Attends religious service: Not on file    Active member of club or organization: Not on file    Attends meetings of clubs or organizations: Not on file    Relationship status: Not on file  Other Topics Concern  . Not on file  Social History Narrative   Widowed 03/02/2007. 2 sons. 4 grandkids (2 girls/2 boys).       Retired-from interior designing      Hobbies: design, family/friends time, decorative books, yardwork          Outpatient Encounter Medications as of 03/04/2018  Medication Sig  . chlorthalidone (HYGROTON) 25 MG tablet Take 1 tablet (25 mg total) by mouth daily.  . ferrous sulfate 325 (65 FE) MG tablet Take 1 tablet (325 mg total) by  mouth 2 (two) times daily.  Marland Kitchen omeprazole (PRILOSEC) 40 MG capsule Take 1 capsule (40 mg total) by mouth daily.  . vitamin C (ASCORBIC ACID) 500 MG tablet Take 500 mg by mouth daily.     No facility-administered encounter medications on file as of 03/04/2018.     Activities of Daily Living In your present state of health, do you have any difficulty performing the following activities: 03/04/2018  Hearing? N  Vision? N  Difficulty concentrating or making decisions? N  Walking or climbing stairs? N  Dressing or bathing? N  Doing errands, shopping? N  Preparing Food and eating ? N  Using the Toilet? N  In the past six months, have you accidently leaked urine? N  Do you have problems with loss of bowel control? N  Managing your Medications? N  Managing your Finances? N  Housekeeping or managing your Housekeeping? N  Some recent data might be hidden    Patient Care Team: [name], MD as PCP - General (Family Medicine) Sherren Mocha, MD as Consulting Physician (Cardiology) Paralee Cancel, MD as Consulting Physician (Orthopedic Surgery)   Assessment:   This is a routine wellness examination for Shelly Johnson.  Exercise Activities and Dietary recommendations Current Exercise Habits: Home exercise routine  Goals    . patient     Will explore the new exercise routine Try new things.  Meditations      . Weight (lb) < 200 lb (90.7 kg)     Lose weight by increasing activity and eating healthier.        Fall Risk Fall Risk  03/04/2018 02/05/2017 03/12/2016 11/27/2014  Falls in the past year? No No No No     Depression Screen PHQ 2/9 Scores 03/04/2018 06/19/2017 02/05/2017 03/12/2016  PHQ - 2 Score 1 2 0 0  PHQ- 9 Score - 11 - -   She feels overwhelmed with recent death of friends and her sister. Also continues to grieve her spouse and is seeing Dr. Glennon Hamilton   Cognitive Function   Ad8 score reviewed for issues:  Issues making decisions:  Less interest in hobbies /  activities:  Repeats questions, stories (family complaining):  Trouble using ordinary  gadgets (microwave, computer, phone):  Forgets the month or year:   Mismanaging finances:   Remembering appts:  Daily problems with thinking and/or memory: Ad8 score is=0  No issues with conversation and living independently       Immunization History  Administered Date(s) Administered  . Influenza, High Dose Seasonal PF 07/04/2016, 06/19/2017  . Influenza,inj,Quad PF,6+ Mos 07/18/2013, 04/25/2014  . Pneumococcal Conjugate-13 07/18/2013, 04/03/2015  . Pneumococcal Polysaccharide-23 07/04/2016  . Tdap 07/21/2013    Screening Tests Health Maintenance  Topic Date Due  . Hepatitis C Screening  [birth year]  . COLONOSCOPY  03/13/2018  . INFLUENZA VACCINE  03/18/2018  . MAMMOGRAM  05/07/2019  . TETANUS/TDAP  07/22/2023  . DEXA SCAN  Completed  . PNA vac Low Risk Adult  Completed       Plan:      PCP Notes   Health Maintenance  Was 220 in January 214lb  Chol/hdl 3; hdl 65;  A1c 6.2  Educated regarding pre-diabetes   Will defer hep c due to spending time with the patient on her grief  Colonoscopy 02/2008 - due 02/2018 - she chooses to postpone for now  Mammogram 04/2017 and due in Sept and will schedule  Dexa 04/2017 - normal   Abnormal Screens  BP mod elevated but stressed and grieving today  Allowed time to talk and ventilate. Seeing Dr. Glennon Hamilton  Referrals  None   Patient concerns; Not ready for another colonoscopy at this time C/o of low energy, in counseling; will have at a later time  Discussed cologuard but will discuss with you next OV   Nurse Concerns; As noted   Next PCP apt TBS  Was seen 6/6        I have personally reviewed and noted the following in the patient's chart:   . Medical and social history . Use of alcohol, tobacco or illicit drugs  . Current medications and supplements . Functional ability and status . Nutritional status . Physical  activity . Advanced directives . List of other physicians . Hospitalizations, surgeries, and ER visits in previous 12 months . Vitals . Screenings to include cognitive, depression, and falls . Referrals and appointments  In addition, I have reviewed and discussed with patient certain preventive protocols, quality metrics, and best practice recommendations. A written personalized care plan for preventive services as well as general preventive health recommendations were provided to patient.     Shelly Fines, RN  03/04/2018

## 2018-03-04 ENCOUNTER — Ambulatory Visit (INDEPENDENT_AMBULATORY_CARE_PROVIDER_SITE_OTHER): Payer: Medicare Other

## 2018-03-04 VITALS — BP 150/80 | HR 74 | Ht 61.0 in | Wt 214.0 lb

## 2018-03-04 DIAGNOSIS — Z0000 Encounter for general adult medical examination without abnormal findings: Secondary | ICD-10-CM | POA: Diagnosis not present

## 2018-03-04 NOTE — Progress Notes (Signed)
I have reviewed and agree with note, evaluation, plan. Thankful she has lost a few lbs.   Manuela Schwartz- please set her up for a blood pressure recheck with me in next month or two. When people have elevated blood pressures above goals, I need to see them back in general for repeat. Also will give me a chance to talk to her about colonoscopy vs. Colguard.   BP Readings from Last 3 Encounters:  03/04/18 (!) 150/80  01/21/18 124/84  09/02/17 (!) 142/88   Garret Reddish, MD

## 2018-03-04 NOTE — Patient Instructions (Addendum)
Ms. Cremeens , Thank you for taking time to come for your Medicare Wellness Visit. I appreciate your ongoing commitment to your health goals. Please review the following plan we discussed and let me know if I can assist you in the future.   Will discuss colonoscopy with Dr. Yong Channel at the next OV   Hearing screens are free Deaf & Hard of Hearing Division Services - can assist with hearing aid x 1  No reviews  [facility]  [id number]  [phone]  [email or web address]_[email or web address]_Buy_[email or web address]_[email or web address]  Educated regarding prediabetes and numbers;  A1c ranges from 5.8 to 6.5 or fasting Blood sugar > 115 -126; (126 is diabetic)   Risk: >45yo; family hx; overweight or obese; African American; Hispanic; Latino; American [place] American; [place]; history of diabetes when pregnant; or birth to a baby weighing over 9 lbs. Being less physically active than 30 minutes; 3 times a week;   Prevention; Losing a modest 7 to 8 lbs; If over 200 lbs; 10 to 14 lbs;  Choose healthier foods; colorful veggies; fish or lean meats; drinks water Reduce portion size Start exercising; 30 minutes of fast walking x 30 minutes per day/ 60 min for weight loss     Shingrix is a vaccine for the prevention of Shingles in Adults 50 and older.  If you are on Medicare, the shingrix is covered under your Part D plan, so you will take both of the vaccines in the series at your pharmacy. Please check with your benefits regarding applicable copays or out of pocket expenses.  The Shingrix is given in 2 vaccines approx 8 weeks apart. You must receive the 2nd dose prior to 6 months from receipt of the first. Please have the pharmacist print out you Immunization  dates for our office records     These are the goals we discussed: Goals    . patient     Will explore the new exercise routine Try new things.  Meditations      .  Weight (lb) < 200 lb (90.7 kg)     Lose weight by increasing activity and eating healthier.        This is a list of the screening recommended for you and due dates:  Health Maintenance  Topic Date Due  .  Hepatitis C: One time screening is recommended by Center for Disease Control  (CDC) for  adults born from [birth year] through 1963.   01/03/1946  . Colon Cancer Screening  03/13/2018  . Flu Shot  03/18/2018  . Mammogram  05/07/2019  . Tetanus Vaccine  07/22/2023  . DEXA scan (bone density measurement)  Completed  . Pneumonia vaccines  Completed      Fall Prevention in the Home Falls can cause injuries. They can happen to people of all ages. There are many things you can do to make your home safe and to help prevent falls. What can I do on the outside of my home?  Regularly fix the edges of walkways and driveways and fix any cracks.  Remove anything that might make you trip as you walk through a door, such as a raised step or threshold.  Trim any bushes or trees on the path to your home.  Use bright outdoor lighting.  Clear any walking paths of anything that might make someone trip, such as rocks or tools.  Regularly check to see if handrails are loose or broken. Make sure that both sides of any steps have  handrails.  Any raised decks and porches should have guardrails on the edges.  Have any leaves, snow, or ice cleared regularly.  Use sand or salt on walking paths during winter.  Clean up any spills in your garage right away. This includes oil or grease spills. What can I do in the bathroom?  Use night lights.  Install grab bars by the toilet and in the tub and shower. Do not use towel bars as grab bars.  Use non-skid mats or decals in the tub or shower.  If you need to sit down in the shower, use a plastic, non-slip stool.  Keep the floor dry. Clean up any water that spills on the floor as soon as it happens.  Remove soap buildup in the tub or shower  regularly.  Attach bath mats securely with double-sided non-slip rug tape.  Do not have throw rugs and other things on the floor that can make you trip. What can I do in the bedroom?  Use night lights.  Make sure that you have a light by your bed that is easy to reach.  Do not use any sheets or blankets that are too big for your bed. They should not hang down onto the floor.  Have a firm chair that has side arms. You can use this for support while you get dressed.  Do not have throw rugs and other things on the floor that can make you trip. What can I do in the kitchen?  Clean up any spills right away.  Avoid walking on wet floors.  Keep items that you use a lot in easy-to-reach places.  If you need to reach something above you, use a strong step stool that has a grab bar.  Keep electrical cords out of the way.  Do not use floor polish or wax that makes floors slippery. If you must use wax, use non-skid floor wax.  Do not have throw rugs and other things on the floor that can make you trip. What can I do with my stairs?  Do not leave any items on the stairs.  Make sure that there are handrails on both sides of the stairs and use them. Fix handrails that are broken or loose. Make sure that handrails are as long as the stairways.  Check any carpeting to make sure that it is firmly attached to the stairs. Fix any carpet that is loose or worn.  Avoid having throw rugs at the top or bottom of the stairs. If you do have throw rugs, attach them to the floor with carpet tape.  Make sure that you have a light switch at the top of the stairs and the bottom of the stairs. If you do not have them, ask someone to add them for you. What else can I do to help prevent falls?  Wear shoes that: ? Do not have high heels. ? Have rubber bottoms. ? Are comfortable and fit you well. ? Are closed at the toe. Do not wear sandals.  If you use a stepladder: ? Make sure that it is fully  opened. Do not climb a closed stepladder. ? Make sure that both sides of the stepladder are locked into place. ? Ask someone to hold it for you, if possible.  Clearly mark and make sure that you can see: ? Any grab bars or handrails. ? First and last steps. ? Where the edge of each step is.  Use tools that help you move around (mobility  aids) if they are needed. These include: ? Canes. ? Walkers. ? Scooters. ? Crutches.  Turn on the lights when you go into a dark area. Replace any light bulbs as soon as they burn out.  Set up your furniture so you have a clear path. Avoid moving your furniture around.  If any of your floors are uneven, fix them.  If there are any pets around you, be aware of where they are.  Review your medicines with your doctor. Some medicines can make you feel dizzy. This can increase your chance of falling. Ask your doctor what other things that you can do to help prevent falls. This information is not intended to replace advice given to you by your health care provider. Make sure you discuss any questions you have with your health care provider. Document Released: 05/31/2009 Document Revised: 01/10/2016 Document Reviewed: 09/08/2014 Elsevier Interactive Patient Education  2018 [place] Maintenance, Female Adopting a healthy lifestyle and getting preventive care can go a long way to promote health and wellness. Talk with your health care provider about what schedule of regular examinations is right for you. This is a good chance for you to check in with your provider about disease prevention and staying healthy. In between checkups, there are plenty of things you can do on your own. Experts have done a lot of research about which lifestyle changes and preventive measures are most likely to keep you healthy. Ask your health care provider for more information. Weight and diet Eat a healthy diet  Be sure to include plenty of vegetables, fruits,  low-fat dairy products, and lean protein.  Do not eat a lot of foods high in solid fats, added sugars, or salt.  Get regular exercise. This is one of the most important things you can do for your health. ? Most adults should exercise for at least 150 minutes each week. The exercise should increase your heart rate and make you sweat (moderate-intensity exercise). ? Most adults should also do strengthening exercises at least twice a week. This is in addition to the moderate-intensity exercise.  Maintain a healthy weight  Body mass index (BMI) is a measurement that can be used to identify possible weight problems. It estimates body fat based on height and weight. Your health care provider can help determine your BMI and help you achieve or maintain a healthy weight.  For females 73 years of age and older: ? A BMI below 18.5 is considered underweight. ? A BMI of 18.5 to 24.9 is normal. ? A BMI of 25 to 29.9 is considered overweight. ? A BMI of 30 and above is considered obese.  Watch levels of cholesterol and blood lipids  You should start having your blood tested for lipids and cholesterol at 70 years of age, then have this test every 5 years.  You may need to have your cholesterol levels checked more often if: ? Your lipid or cholesterol levels are high. ? You are older than 70 years of age. ? You are at high risk for heart disease.  Cancer screening Lung Cancer  Lung cancer screening is recommended for adults 28-55 years old who are at high risk for lung cancer because of a history of smoking.  A yearly low-dose CT scan of the lungs is recommended for people who: ? Currently smoke. ? Have quit within the past 15 years. ? Have at least a 30-pack-year history of smoking. A pack year is smoking an average of one  pack of cigarettes a day for 1 year.  Yearly screening should continue until it has been 15 years since you quit.  Yearly screening should stop if you develop a health  problem that would prevent you from having lung cancer treatment.  Breast Cancer  Practice breast self-awareness. This means understanding how your breasts normally appear and feel.  It also means doing regular breast self-exams. Let your health care provider know about any changes, no matter how small.  If you are in your 20s or 30s, you should have a clinical breast exam (CBE) by a health care provider every 1-3 years as part of a regular health exam.  If you are 45 or older, have a CBE every year. Also consider having a breast X-ray (mammogram) every year.  If you have a family history of breast cancer, talk to your health care provider about genetic screening.  If you are at high risk for breast cancer, talk to your health care provider about having an MRI and a mammogram every year.  Breast cancer gene (BRCA) assessment is recommended for women who have family members with BRCA-related cancers. BRCA-related cancers include: ? Breast. ? Ovarian. ? Tubal. ? Peritoneal cancers.  Results of the assessment will determine the need for genetic counseling and BRCA1 and BRCA2 testing.  Cervical Cancer Your health care provider may recommend that you be screened regularly for cancer of the pelvic organs (ovaries, uterus, and vagina). This screening involves a pelvic examination, including checking for microscopic changes to the surface of your cervix (Pap test). You may be encouraged to have this screening done every 3 years, beginning at age 51.  For women ages 20-65, health care providers may recommend pelvic exams and Pap testing every 3 years, or they may recommend the Pap and pelvic exam, combined with testing for human papilloma virus (HPV), every 5 years. Some types of HPV increase your risk of cervical cancer. Testing for HPV may also be done on women of any age with unclear Pap test results.  Other health care providers may not recommend any screening for nonpregnant women who are  considered low risk for pelvic cancer and who do not have symptoms. Ask your health care provider if a screening pelvic exam is right for you.  If you have had past treatment for cervical cancer or a condition that could lead to cancer, you need Pap tests and screening for cancer for at least 20 years after your treatment. If Pap tests have been discontinued, your risk factors (such as having a new sexual partner) need to be reassessed to determine if screening should resume. Some women have medical problems that increase the chance of getting cervical cancer. In these cases, your health care provider may recommend more frequent screening and Pap tests.  Colorectal Cancer  This type of cancer can be detected and often prevented.  Routine colorectal cancer screening usually begins at 70 years of age and continues through 70 years of age.  Your health care provider may recommend screening at an earlier age if you have risk factors for colon cancer.  Your health care provider may also recommend using home test kits to check for hidden blood in the stool.  A small camera at the end of a tube can be used to examine your colon directly (sigmoidoscopy or colonoscopy). This is done to check for the earliest forms of colorectal cancer.  Routine screening usually begins at age 44.  Direct examination of the colon should be  repeated every 5-10 years through 70 years of age. However, you may need to be screened more often if early forms of precancerous polyps or small growths are found.  Skin Cancer  Check your skin from head to toe regularly.  Tell your health care provider about any new moles or changes in moles, especially if there is a change in a mole's shape or color.  Also tell your health care provider if you have a mole that is larger than the size of a pencil eraser.  Always use sunscreen. Apply sunscreen liberally and repeatedly throughout the day.  Protect yourself by wearing long  sleeves, pants, a wide-brimmed hat, and sunglasses whenever you are outside.  Heart disease, diabetes, and high blood pressure  High blood pressure causes heart disease and increases the risk of stroke. High blood pressure is more likely to develop in: ? People who have blood pressure in the high end of the normal range (130-139/85-89 mm Hg). ? People who are overweight or obese. ? People who are African American.  If you are 40-71 years of age, have your blood pressure checked every 3-5 years. If you are 17 years of age or older, have your blood pressure checked every year. You should have your blood pressure measured twice-once when you are at a hospital or clinic, and once when you are not at a hospital or clinic. Record the average of the two measurements. To check your blood pressure when you are not at a hospital or clinic, you can use: ? An automated blood pressure machine at a pharmacy. ? A home blood pressure monitor.  If you are between 79 years and 55 years old, ask your health care provider if you should take aspirin to prevent strokes.  Have regular diabetes screenings. This involves taking a blood sample to check your fasting blood sugar level. ? If you are at a normal weight and have a low risk for diabetes, have this test once every three years after 70 years of age. ? If you are overweight and have a high risk for diabetes, consider being tested at a younger age or more often. Preventing infection Hepatitis B  If you have a higher risk for hepatitis B, you should be screened for this virus. You are considered at high risk for hepatitis B if: ? You were born in a country where hepatitis B is common. Ask your health care provider which countries are considered high risk. ? Your parents were born in a high-risk country, and you have not been immunized against hepatitis B (hepatitis B vaccine). ? You have HIV or AIDS. ? You use needles to inject street drugs. ? You live with  someone who has hepatitis B. ? You have had sex with someone who has hepatitis B. ? You get hemodialysis treatment. ? You take certain medicines for conditions, including cancer, organ transplantation, and autoimmune conditions.  Hepatitis C  Blood testing is recommended for: ? Everyone born from [birth year] through 1963. ? Anyone with known risk factors for hepatitis C.  Sexually transmitted infections (STIs)  You should be screened for sexually transmitted infections (STIs) including gonorrhea and chlamydia if: ? You are sexually active and are younger than 70 years of age. ? You are older than 70 years of age and your health care provider tells you that you are at risk for this type of infection. ? Your sexual activity has changed since you were last screened and you are at an increased risk for  chlamydia or gonorrhea. Ask your health care provider if you are at risk.  If you do not have HIV, but are at risk, it may be recommended that you take a prescription medicine daily to prevent HIV infection. This is called pre-exposure prophylaxis (PrEP). You are considered at risk if: ? You are sexually active and do not regularly use condoms or know the HIV status of your partner(s). ? You take drugs by injection. ? You are sexually active with a partner who has HIV.  Talk with your health care provider about whether you are at high risk of being infected with HIV. If you choose to begin PrEP, you should first be tested for HIV. You should then be tested every 3 months for as long as you are taking PrEP. Pregnancy  If you are premenopausal and you may become pregnant, ask your health care provider about preconception counseling.  If you may become pregnant, take 400 to 800 micrograms (mcg) of folic acid every day.  If you want to prevent pregnancy, talk to your health care provider about birth control (contraception). Osteoporosis and menopause  Osteoporosis is a disease in which the bones lose  minerals and strength with aging. This can result in serious bone fractures. Your risk for osteoporosis can be identified using a bone density scan.  If you are 34 years of age or older, or if you are at risk for osteoporosis and fractures, ask your health care provider if you should be screened.  Ask your health care provider whether you should take a calcium or vitamin D supplement to lower your risk for osteoporosis.  Menopause may have certain physical symptoms and risks.  Hormone replacement therapy may reduce some of these symptoms and risks. Talk to your health care provider about whether hormone replacement therapy is right for you. Follow these instructions at home:  Schedule regular health, dental, and eye exams.  Stay current with your immunizations.  Do not use any tobacco products including cigarettes, chewing tobacco, or electronic cigarettes.  If you are pregnant, do not drink alcohol.  If you are breastfeeding, limit how much and how often you drink alcohol.  Limit alcohol intake to no more than 1 drink per day for nonpregnant women. One drink equals 12 ounces of beer, 5 ounces of wine, or 1 ounces of hard liquor.  Do not use street drugs.  Do not share needles.  Ask your health care provider for help if you need support or information about quitting drugs.  Tell your health care provider if you often feel depressed.  Tell your health care provider if you have ever been abused or do not feel safe at home. This information is not intended to replace advice given to you by your health care provider. Make sure you discuss any questions you have with your health care provider. Document Released: 02/17/2011 Document Revised: 01/10/2016 Document Reviewed: 05/08/2015 Elsevier Interactive Patient Education  2018 [employer].   Hearing Loss Hearing loss is a partial or total loss of the ability to hear. This can be temporary or permanent, and it can happen in one or  both ears. Hearing loss may be referred to as deafness. Medical care is necessary to treat hearing loss properly and to prevent the condition from getting worse. Your hearing may partially or completely come back, depending on what caused your hearing loss and how severe it is. In some cases, hearing loss is permanent. What are the causes? Common causes of hearing loss  include:  Too much wax in the ear canal.  Infection of the ear canal or middle ear.  Fluid in the middle ear.  Injury to the ear or surrounding area.  An object stuck in the ear.  Prolonged exposure to loud sounds, such as music.  Less common causes of hearing loss include:  Tumors in the ear.  Viral or bacterial infections, such as meningitis.  A hole in the eardrum (perforated eardrum).  Problems with the hearing nerve that sends signals between the brain and the ear.  Certain medicines.  What are the signs or symptoms? Symptoms of this condition may include:  Difficulty telling the difference between sounds.  Difficulty following a conversation when there is background noise.  Lack of response to sounds in your environment. This may be most noticeable when you do not respond to startling sounds.  Needing to turn up the volume on the television, radio, etc.  Ringing in the ears.  Dizziness.  Pain in the ears.  How is this diagnosed? This condition is diagnosed based on a physical exam and a hearing test (audiometry). The audiometry test will be performed by a hearing specialist (audiologist). You may also be referred to an ear, nose, and throat (ENT) specialist (otolaryngologist). How is this treated? Treatment for recent onset of hearing loss may include:  Ear wax removal.  Being prescribed medicines to prevent infection (antibiotics).  Being prescribed medicines to reduce inflammation (corticosteroids).  Follow these instructions at home:  If you were prescribed an antibiotic medicine,  take it as told by your health care provider. Do not stop taking the antibiotic even if you start to feel better.  Take over-the-counter and prescription medicines only as told by your health care provider.  Avoid loud noises.  Return to your normal activities as told by your health care provider. Ask your health care provider what activities are safe for you.  Keep all follow-up visits as told by your health care provider. This is important. Contact a health care provider if:  You feel dizzy.  You develop new symptoms.  You vomit or feel nauseous.  You have a fever. Get help right away if:  You develop sudden changes in your vision.  You have severe ear pain.  You have new or increased weakness.  You have a severe headache. This information is not intended to replace advice given to you by your health care provider. Make sure you discuss any questions you have with your health care provider. Document Released: 08/04/2005 Document Revised: 01/10/2016 Document Reviewed: 12/20/2014 Elsevier Interactive Patient Education  2018 [employer].

## 2018-03-11 ENCOUNTER — Telehealth: Payer: Self-pay

## 2018-03-11 NOTE — Telephone Encounter (Signed)
LVm on self identified recorder to schedule apt with dr. Yong Channel to fup on her BP which was elevated at her AWV.  Will fup on apt of call back. Wynetta Fines RN

## 2018-03-12 NOTE — Telephone Encounter (Signed)
Call placed and Ms. Beason stated she will call and make an apt with Dr. Yong Channel next month to recheck bP Wynetta Fines rRN

## 2018-03-16 ENCOUNTER — Ambulatory Visit: Payer: Self-pay | Admitting: Family Medicine

## 2018-03-16 NOTE — Telephone Encounter (Signed)
Phone call to pt. to discuss symptoms.  Reported onset of scratchy throat, and need to clear throat, on Saturday.  Reported onset of cough on Sunday.  Stated the cough is intermittent and has coughed up very small amts. of a yellow-green phlegm.  Stated she has some sinus pressure in her cheeks.  Denied nasal drainage at this time.  Denied fever/ chills.  Stated she feels more tired.  Reported using Aleve, Robitussin DM, Salt water gargle, and Saline nasal spray.  Stated "the cough is annoying."  Denied chest heaviness/ pain or wheezing.  Stated has some shortness of breath when walking to and from mailbox; reported this is her baseline, but thinks it may be a little more pronounced.  Denied any resp. distress.  Pt. requested advice for home care.  Advised since onset of symptoms within 48-72  hrs., can give home care recommendations, with agreement to call back, if symptoms worsen.  Care Advice given per protocol for cough.  Pt. verb. understanding; agrees with plan.        Reason for Disposition . Cough  Answer Assessment - Initial Assessment Questions 1. ONSET: "When did the cough begin?"      Started on Sunday 2. SEVERITY: "How bad is the cough today?"      "It's annoying" 3. RESPIRATORY DISTRESS: "Describe your breathing."     Gets short of breath with activity of walking to mailbox and back 4. FEVER: "Do you have a fever?" If so, ask: "What is your temperature, how was it measured, and when did it start?"     Denied any fever/ chills 5. SPUTUM: "Describe the color of your sputum" (clear, white, yellow, green)     Yellow-green 6. HEMOPTYSIS: "Are you coughing up any blood?" If so ask: "How much?" (flecks, streaks, tablespoons, etc.)     Denied  7. CARDIAC HISTORY: "Do you have any history of heart disease?" (e.g., heart attack, congestive heart failure)      Denied the above 8. LUNG HISTORY: "Do you have any history of lung disease?"  (e.g., pulmonary embolus, asthma, emphysema)     Denied   9. PE RISK FACTORS: "Do you have a history of blood clots?" (or: recent major surgery, recent prolonged travel, bedridden)    Denied prolonged travel or recent major surgery   10. OTHER SYMPTOMS: "Do you have any other symptoms?" (e.g., runny nose, wheezing, chest pain)      Denied fever/ chills; feels sinus pressure in facial area, scratchy throat, expectorating very small amt. Yellow-green phlegm, denied chest pain or wheezing; some shortness of breath with activity ; very tired 11. PREGNANCY: "Is there any chance you are pregnant?" "When was your last menstrual period?"       N/a  12. TRAVEL: "Have you traveled out of the country in the last month?" (e.g., travel history, exposures)       denied  Protocols used: [place]

## 2018-03-27 ENCOUNTER — Other Ambulatory Visit: Payer: Self-pay | Admitting: Family Medicine

## 2018-04-28 ENCOUNTER — Encounter: Payer: Self-pay | Admitting: Family Medicine

## 2018-04-28 ENCOUNTER — Ambulatory Visit (INDEPENDENT_AMBULATORY_CARE_PROVIDER_SITE_OTHER): Payer: Medicare Other | Admitting: Family Medicine

## 2018-04-28 VITALS — BP 126/80 | HR 82 | Temp 98.6°F | Ht 61.0 in | Wt 214.2 lb

## 2018-04-28 DIAGNOSIS — F321 Major depressive disorder, single episode, moderate: Secondary | ICD-10-CM | POA: Diagnosis not present

## 2018-04-28 DIAGNOSIS — K219 Gastro-esophageal reflux disease without esophagitis: Secondary | ICD-10-CM | POA: Diagnosis not present

## 2018-04-28 DIAGNOSIS — K449 Diaphragmatic hernia without obstruction or gangrene: Secondary | ICD-10-CM

## 2018-04-28 DIAGNOSIS — I10 Essential (primary) hypertension: Secondary | ICD-10-CM | POA: Diagnosis not present

## 2018-04-28 DIAGNOSIS — Z23 Encounter for immunization: Secondary | ICD-10-CM | POA: Diagnosis not present

## 2018-04-28 NOTE — Assessment & Plan Note (Signed)
S: Patient states she continues to have some depressed mood- states simply has never healed since losing her husband in 2008. She states life will never be the same- like a broken heart that wont heal. She still can find joy in some activities but misses him terribly even still. She has low motivation to eat well/exercise as a result and even to do some of the things she knows she needs to do.   She missed last visit with Dr. Glennon Hamilton as had to attend another funeral- now sounds like 4 deaths over the last year. She does not plan to return- enjoyed the visits but did not have improvement in depression.  A/P: I advised patient to consider medication or continued counseling- she declines at this time. She will let me know if new or worsening symptoms or if she changes her mind.

## 2018-04-28 NOTE — Assessment & Plan Note (Signed)
S: controlled on chlorthalidone 25mg  . Home #s from 101-137/60-85 since July  BP Readings from Last 3 Encounters:  04/28/18 126/80  03/04/18 (!) 150/80  01/21/18 124/84  A/P: We discussed blood pressure goal of <140/90. Continue current meds.   We also discussed her recent dip in GFR under 60. Advised repeat labs today under hypertension but she declines- at next visit agrees to CBC and CMP. She knows to avoid aleve and is using Very sparing aleve- 1 every 1-2 months

## 2018-04-28 NOTE — Assessment & Plan Note (Signed)
S: continued intermittent epigastric pain particularly associated with meals. This is despite compliance with omeprazole 40mg  A/P: strongly encouraged GI follow up - luckily she has scheduled visit with GI in October. She states would likely decline endoscopy or colonoscopy and I discussed with her importance of particularly endoscopy for her symptoms. I also wonder down the road with the hiatal hernia if surgeyr will need to be considered. I advised repeat CBC given history of anemia. She declines. Is still on iron though.

## 2018-04-28 NOTE — Patient Instructions (Signed)
Flu shot today  Consider going back and seeing Dr. Glennon Hamilton- I will respect your decision on no medicine  Thanks for bringing yoru home BPs

## 2018-04-28 NOTE — Progress Notes (Signed)
Subjective:  Shelly Johnson is a 72 y.o. year old very pleasant female patient who presents for/with See problem oriented charting ROS- no reports of thoughts of self harm. Still feels down at times. Reports fear of blooddraws/needles. No reported chest pain. No edema.    Past Medical History-  Patient Active Problem List   Diagnosis Date Noted  . Depression, major, single episode, moderate ([place]) 06/20/2017    Priority: Medium  . Essential hypertension 08/23/2016    Priority: Medium  . ANEMIA-NOS 03/16/2008    Priority: Medium  . Morbid obesity ([place]) 04/28/2018    Priority: Low  . Hiatal hernia with gastroesophageal reflux 03/08/2008    Priority: Low  . Numbness and tingling of both feet 01/21/2018  . Dyspnea on exertion 08/23/2016    Medications- reviewed and updated Current Outpatient Medications  Medication Sig Dispense Refill  . chlorthalidone (HYGROTON) 25 MG tablet Take 1 tablet (25 mg total) by mouth daily. 90 tablet 3  . ferrous sulfate 325 (65 FE) MG tablet Take 1 tablet (325 mg total) by mouth 2 (two) times daily. 60 tablet 3  . omeprazole (PRILOSEC) 40 MG capsule TAKE 1 CAPSULE BY MOUTH EVERY DAY 90 capsule 1  . vitamin C (ASCORBIC ACID) 500 MG tablet Take 500 mg by mouth daily.       No current facility-administered medications for this visit.     Objective: BP 126/80 (BP Location: Left Arm, Cuff Size: Large)   Pulse 82   Temp 98.6 F (37 C) (Oral)   Ht 5\' 1"  (1.549 m)   Wt 214 lb 3.2 oz (97.2 kg)   SpO2 95%   BMI 40.47 kg/m  Gen: NAD, resting comfortably CV: RRR no murmurs rubs or gallops Lungs: CTAB no crackles, wheeze, rhonchi Abdomen: soft/nontender/nondistended/normal bowel sounds.[place]  Ext: no edema Skin: warm, dry Neuro: grossly normal, moves all extremities  Assessment/Plan:  Essential hypertension S: controlled on chlorthalidone 25mg  . Home #s from 101-137/60-85 since July  BP Readings from Last 3 Encounters:  04/28/18 126/80  03/04/18 (!)  150/80  01/21/18 124/84  A/P: We discussed blood pressure goal of <140/90. Continue current meds.   We also discussed her recent dip in GFR under 60. Advised repeat labs today under hypertension but she declines- at next visit agrees to CBC and CMP. She knows to avoid aleve and is using Very sparing aleve- 1 every 1-2 months  Depression, major, single episode, moderate ([place]) S: Patient states she continues to have some depressed mood- states simply has never healed since losing her husband in 2008. She states life will never be the same- like a broken heart that wont heal. She still can find joy in some activities but misses him terribly even still. She has low motivation to eat well/exercise as a result and even to do some of the things she knows she needs to do.   She missed last visit with Dr. Glennon Hamilton as had to attend another funeral- now sounds like 4 deaths over the last year. She does not plan to return- enjoyed the visits but did not have improvement in depression.  A/P: I advised patient to consider medication or continued counseling- she declines at this time. She will let me know if new or worsening symptoms or if she changes her mind.    Hiatal hernia with gastroesophageal reflux S: continued intermittent epigastric pain particularly associated with meals. This is despite compliance with omeprazole 40mg  A/P: strongly encouraged GI follow up - luckily she has  scheduled visit with GI in October. She states would likely decline endoscopy or colonoscopy and I discussed with her importance of particularly endoscopy for her symptoms. I also wonder down the road with the hiatal hernia if surgeyr will need to be considered. I advised repeat CBC given history of anemia. She declines. Is still on iron though.    Morbid obesity ([place]) Noted after visit on BMI. Will attempt to counsel at future visits  Future Appointments  Date Time Provider [place]  05/25/2018  1:30 PM Irene Shipper, MD  [facility]  03/10/2019  3:00 PM [facility] HEALTH COACH [facility]   Lab/Order associations: Need for prophylactic vaccination and inoculation against influenza - Plan: Flu vaccine HIGH DOSE PF  Essential hypertension  Depression, major, single episode, moderate ([place])  Hiatal hernia with gastroesophageal reflux  Morbid obesity ([place])  Return precautions advised.  Garret Reddish, MD

## 2018-04-28 NOTE — Assessment & Plan Note (Addendum)
Noted after visit on BMI. Will attempt to counsel at future visits if she is emotionally in a space for this

## 2018-05-18 ENCOUNTER — Other Ambulatory Visit: Payer: Self-pay | Admitting: Family Medicine

## 2018-05-18 DIAGNOSIS — Z1231 Encounter for screening mammogram for malignant neoplasm of breast: Secondary | ICD-10-CM

## 2018-05-25 ENCOUNTER — Encounter: Payer: Self-pay | Admitting: Internal Medicine

## 2018-05-25 ENCOUNTER — Ambulatory Visit (INDEPENDENT_AMBULATORY_CARE_PROVIDER_SITE_OTHER): Payer: Medicare Other | Admitting: Internal Medicine

## 2018-05-25 VITALS — BP 140/78 | HR 76 | Ht 61.0 in | Wt 214.4 lb

## 2018-05-25 DIAGNOSIS — K449 Diaphragmatic hernia without obstruction or gangrene: Secondary | ICD-10-CM

## 2018-05-25 DIAGNOSIS — K219 Gastro-esophageal reflux disease without esophagitis: Secondary | ICD-10-CM

## 2018-05-25 DIAGNOSIS — Z1211 Encounter for screening for malignant neoplasm of colon: Secondary | ICD-10-CM | POA: Diagnosis not present

## 2018-05-25 DIAGNOSIS — R1013 Epigastric pain: Secondary | ICD-10-CM | POA: Diagnosis not present

## 2018-05-25 DIAGNOSIS — R159 Full incontinence of feces: Secondary | ICD-10-CM | POA: Diagnosis not present

## 2018-05-25 DIAGNOSIS — R112 Nausea with vomiting, unspecified: Secondary | ICD-10-CM | POA: Diagnosis not present

## 2018-05-25 NOTE — Progress Notes (Signed)
HISTORY OF PRESENT ILLNESS:  Shelly Johnson is a 72 y.o. female who presents today with chief complaints of ongoing worsening recurrent epigastric pain, nausea with vomiting, active reflux symptoms, alternating bowel habits, fecal incontinence, she was last seen in this office September 02, 2017 with similar complaints.  See that dictation for details.  For her worsening reflux symptoms compliance of PPI stressed.  40 mg daily recommended in the optimal timing for medication usage reviewed.  It was recommended that she undergo upper endoscopy, but she refused.  It was recommended that she undergo abdominal ultrasound, but she refused.  It has been 10 years since her last colonoscopy.  Follow-up colonoscopy or other screening methods reviewed but refused.  She was told to initiate fiber supplementation which she has not.  Also protective undergarments as needed.  She was told to follow-up in the office but did not.  She presents at this time at the urging of Dr. Yong Channel after expressing multiple GI complaints, specifically epigastric pain.  The patient underwent upper endoscopy with Dr. Collene Mares as an inpatient April 2012.  She was found to have a large hiatal hernia with Lysbeth Galas erosions and a small antral ulcer.  Testing for Helicobacter pylori was negative.  As stated previously her last colonoscopy was 2009.  Diverticulosis but no neoplasia.  The patient tells me that she has epigastric pain daily.  Often after meals.  Feels hard or light pressure.  2 or 3 times per week will be followed by nausea with vomiting with transient relief.  This does not wake her at night.  She does have active pyrosis and regurgitation despite once daily omeprazole 40 mg.  She continues with alternating bowel habits and occasional incontinence.  She has had 6 pound weight reduction since her last visit.  However her BMI remains at 40.5.  Review of laboratories from January 21, 2018 find unremarkable CBC with hemoglobin 15.7.  Unremarkable  comprehensive metabolic panel with normal liver test.  Recent imaging.  When questioned regarding her reluctance to follow through with instructions or recommendations she tells me that she has no or limited confidence in medical professionals due to personal experiences.  She does not elaborate  REVIEW OF SYSTEMS:  All non-GI ROS negative unless otherwise stated in the HPI except for arthritis, back pain, cough, fatigue, itching, sinus and allergy, leg swelling, urinary leakage, shortness of breath  Past Medical History:  Diagnosis Date  . Anemia   . Anxiety and depression    during loss of husband  . Arthritis   . Diverticulosis   . GERD (gastroesophageal reflux disease)   . GI bleed    hx of; hgb as low as 5.9  . Hemorrhoids   . Hiatal hernia   . Obesity   . Pneumonia   . Urinary tract bacterial infections     Past Surgical History:  Procedure Laterality Date  . ABDOMINAL HYSTERECTOMY     states had a "few cancer cells" but they "got everything". ? ovaries and cervix.   . APPENDECTOMY    . HIATAL HERNIA REPAIR      Social History Shelly Johnson  reports that she has never smoked. She has never used smokeless tobacco. She reports that she drinks alcohol. She reports that she does not use drugs.  family history includes Arthritis in her mother; Breast cancer in her maternal grandmother; Colon cancer (age of onset: 42) in her maternal aunt; Diabetes in her brother; Hypertension in her brother; Uterine cancer in  her mother.  Allergies  Allergen Reactions  . Nsaids     Should avoid-- GI bleed  . Hydrocodone Nausea And Vomiting       PHYSICAL EXAMINATION: Vital signs: BP 140/78   Pulse 76   Ht 5' 1"  (1.549 m)   Wt 214 lb 6 oz (97.2 kg)   BMI 40.51 kg/m   Constitutional: Obese but generally well-appearing, no acute distress Psychiatric: alert and oriented x3, cooperative Eyes: extraocular movements intact, anicteric, conjunctiva pink. Mouth: oral pharynx moist, no  lesions.  No thrush Neck: supple no lymphadenopathy Cardiovascular: heart regular rate and rhythm, no murmur Lungs: clear to auscultation bilaterally Abdomen: soft, obese, nontender, nondistended, no obvious ascites, no peritoneal signs, normal bowel sounds, no organomegaly Rectal: Omitted Extremities: no clubbing, cyanosis, or lower extremity edema bilaterally Skin: no lesions on visible extremities Neuro: No focal deficits.  Cranial nerves intact  ASSESSMENT:  1.  Intermittent problems with epigastric pain, nausea, and vomiting.  Possible causes include uncontrolled reflux, symptoms related to hiatal hernia, recurrent ulcer disease, or gallbladder disease. 2.  GERD.  Ongoing.  Inadequately controlled on once daily omeprazole 3.  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  Resolved.  On chronic iron 4.  Morbid obesity.  Ongoing.  BMI 40.5 5.  Colon cancer screening.  Last examination July 2009 with diverticulosis.  Due for follow-up of some sort 6.  Alternating bowel habits with occasional incontinence 7.  Limited medical compliance, skeptical about medical professionals per her report.  Limits the ability to take care of her properly.  I let her know this in a nonthreatening manner  PLAN:  1.  Reflux precautions 2.  Weight loss 3.  Increase omeprazole from 40 mg daily to 40 mg twice daily 4.  Recommended upper endoscopy.  The patient declined 5.  Recommended abdominal ultrasound.  The patient agreed 6.  Metamucil 2 tablespoons daily to assist with alternating bowel habits 7.  Discussed various methods for colon cancer screening including optical colonoscopy, FIT stool test, or Cologuard testing.  I reviewed each method and the general implications of positive or negative results.  The patient declined 8.  Continue iron therapy indefinitely 9.  Further recommendations to be determined  40 minutes spent face-to-face with the patient.  Greater than 50% the  time used for counseling regarding her myriad of GI issues and diagnoses and the treatment plan as outlined as well as great efforts toward patient education to allow maximal assistance with  her care

## 2018-05-25 NOTE — Patient Instructions (Addendum)
You have been scheduled for an abdominal ultrasound at [facility] (1st floor of hospital) on 06/01/2018 at 8:00am. Please arrive 15 minutes prior to your appointment for registration. Make certain not to have anything to eat or drink after midnight prior to your appointment. Should you need to reschedule your appointment, please contact radiology at [phone]. This test typically takes about 30 minutes to perform.  Increase your Omeprazole to twice a week to see if this makes a difference.  If it does, call the office and I will send in a new prescription

## 2018-06-01 ENCOUNTER — Ambulatory Visit (HOSPITAL_COMMUNITY): Payer: Medicare Other

## 2018-06-01 ENCOUNTER — Ambulatory Visit (HOSPITAL_COMMUNITY)
Admission: RE | Admit: 2018-06-01 | Discharge: 2018-06-01 | Disposition: A | Discharge status: Home / Self Care | Payer: Medicare Other | Source: Ambulatory Visit | Attending: Internal Medicine | Admitting: Internal Medicine

## 2018-06-01 DIAGNOSIS — K219 Gastro-esophageal reflux disease without esophagitis: Secondary | ICD-10-CM | POA: Diagnosis not present

## 2018-06-01 DIAGNOSIS — R112 Nausea with vomiting, unspecified: Secondary | ICD-10-CM | POA: Insufficient documentation

## 2018-06-01 DIAGNOSIS — R932 Abnormal findings on diagnostic imaging of liver and biliary tract: Secondary | ICD-10-CM | POA: Insufficient documentation

## 2018-06-01 DIAGNOSIS — Z1211 Encounter for screening for malignant neoplasm of colon: Secondary | ICD-10-CM | POA: Diagnosis not present

## 2018-06-01 DIAGNOSIS — R1013 Epigastric pain: Secondary | ICD-10-CM | POA: Diagnosis not present

## 2018-06-22 ENCOUNTER — Telehealth: Payer: Self-pay | Admitting: Internal Medicine

## 2018-06-22 ENCOUNTER — Ambulatory Visit: Payer: Self-pay

## 2018-06-22 MED ORDER — OMEPRAZOLE 40 MG PO CPDR: 40.0000 mg | DELAYED_RELEASE_CAPSULE | Freq: Two times a day (BID) | ORAL | 3 refills | Status: DC

## 2018-06-22 NOTE — Telephone Encounter (Signed)
Patient also wanting medication prescribed in a 90 day supply.

## 2018-06-22 NOTE — Telephone Encounter (Signed)
Sent new Prilosec for twice a day, 90 day supply, to pharmacy

## 2018-06-22 NOTE — Telephone Encounter (Signed)
See note

## 2018-06-22 NOTE — Telephone Encounter (Signed)
Pt. Reports she started having cold symptoms this past Saturday. Coughing up yellowish green mucus, but reports "I feel a little better." Wants to know what she can take safely with her high blood pressure. Instructed [place] is safe to take. Reviewed home remedies with pt. States she is already doing saline rinses.Instructed if she starts feeling worse, to call back. Verbalizes understanding.  Reason for Disposition . Cough with cold symptoms (e.g., runny nose, postnasal drip, throat clearing)  Answer Assessment - Initial Assessment Questions 1. ONSET: "When did the cough begin?"      Started Saturday 2. SEVERITY: "How bad is the cough today?"      Moderate 3. RESPIRATORY DISTRESS: "Describe your breathing."      No distress 4. FEVER: "Do you have a fever?" If so, ask: "What is your temperature, how was it measured, and when did it start?"     Maybe over the weekend 5. SPUTUM: "Describe the color of your sputum" (clear, white, yellow, green)     Yellow-green 6. HEMOPTYSIS: "Are you coughing up any blood?" If so ask: "How much?" (flecks, streaks, tablespoons, etc.)     No 7. CARDIAC HISTORY: "Do you have any history of heart disease?" (e.g., heart attack, congestive heart failure)      No 8. LUNG HISTORY: "Do you have any history of lung disease?"  (e.g., pulmonary embolus, asthma, emphysema)     No 9. PE RISK FACTORS: "Do you have a history of blood clots?" (or: recent major surgery, recent prolonged travel, bedridden)     No 10. OTHER SYMPTOMS: "Do you have any other symptoms?" (e.g., runny nose, wheezing, chest pain)       Wheezing and runny nose 11. PREGNANCY: "Is there any chance you are pregnant?" "When was your last menstrual period?"       n/a 12. TRAVEL: "Have you traveled out of the country in the last month?" (e.g., travel history, exposures)       No  Protocols used: [place]

## 2018-06-23 ENCOUNTER — Ambulatory Visit: Payer: Self-pay

## 2018-06-23 NOTE — Telephone Encounter (Signed)
noted 

## 2018-07-08 ENCOUNTER — Ambulatory Visit
Admission: RE | Admit: 2018-07-08 | Discharge: 2018-07-08 | Disposition: A | Discharge status: Home / Self Care | Payer: Medicare Other | Source: Ambulatory Visit | Attending: Family Medicine | Admitting: Family Medicine

## 2018-07-08 DIAGNOSIS — Z1231 Encounter for screening mammogram for malignant neoplasm of breast: Secondary | ICD-10-CM

## 2018-07-12 DIAGNOSIS — H2513 Age-related nuclear cataract, bilateral: Secondary | ICD-10-CM | POA: Diagnosis not present

## 2018-07-12 DIAGNOSIS — H52203 Unspecified astigmatism, bilateral: Secondary | ICD-10-CM | POA: Diagnosis not present

## 2018-07-23 ENCOUNTER — Telehealth: Payer: Self-pay | Admitting: Family Medicine

## 2018-07-23 ENCOUNTER — Ambulatory Visit: Payer: Self-pay

## 2018-07-23 NOTE — Telephone Encounter (Signed)
Patient called in with c/o "sinus congestion." She says "I called and received some home advise around 06/21/18, when all of this congestion started. I did everything the nurse told me and it seemed to clear up. Then as soon as I stop taking the medications, the symptoms come back. I was wondering what else can I do to clear it all up or is it allergies." I advised at this point, an office visit would be recommended. I asked about the nasal congestion, she says "I don't have pain, just congestion in my sinuses. There is tenderness to my sinuses. My drainage is clear when I blow out. I have drainage to the back of my throat that causes me to clear my throat." I asked about other symptoms, she says "I have a dry, hacking cough that is a lot better than it was 1 month ago when I called. No other symptoms." According to protocol, see PCP within 3 days, no availability with PCP. I offered to schedule with another provider, she says she only wants to see Dr. Yong Channel. I called the office and spoke to [place], [facility] who advised to send this note over and she will speak to the nurse when she's available to have the patient come in early next week. I advised the patient of the above and advised she will receive a call from the office with any recommendations, care advice given, patient verbalized understanding.  Reason for Disposition . [1] Sinus congestion (pressure, fullness) AND [2] present > 10 days  Answer Assessment - Initial Assessment Questions 1. LOCATION: "Where does it hurt?"      No pain 2. ONSET: "When did the sinus pain start?"  (e.g., hours, days)      Congestion 06/21/18 3. SEVERITY: "How bad is the pain?"   (Scale 1-10; mild, moderate or severe)   - MILD (1-3): doesn't interfere with normal activities    - MODERATE (4-7): interferes with normal activities (e.g., work or school) or awakens from sleep   - SEVERE (8-10): excruciating pain and patient unable to do any normal activities       No pain 4.  RECURRENT SYMPTOM: "Have you ever had sinus problems before?" If so, ask: "When was the last time?" and "What happened that time?"      A couple of sinus infections 5. NASAL CONGESTION: "Is the nose blocked?" If so, ask, "Can you open it or must you breathe through the mouth?"     No 6. NASAL DISCHARGE: "Do you have discharge from your nose?" If so ask, "What color?"     Clear 7. FEVER: "Do you have a fever?" If so, ask: "What is it, how was it measured, and when did it start?"      No 8. OTHER SYMPTOMS: "Do you have any other symptoms?" (e.g., sore throat, cough, earache, difficulty breathing)    Dry hacking cough, tenderness to sinuses, drainage into throat 9. PREGNANCY: "Is there any chance you are pregnant?" "When was your last menstrual period?"     No  Protocols used: SINUS PAIN OR CONGESTION-A-AH

## 2018-07-23 NOTE — Telephone Encounter (Signed)
im willing to do Monday at like 4 45 but would need late coverage for this- need to make sure ok with Lea/Cassie/kara. Other than that- talk to them about coverage for Wednesday afternoon- I cancelled my 1 o clock outside appointment so I can work that day if we have coverage.

## 2018-07-23 NOTE — Telephone Encounter (Signed)
Pt called [facility] and spoke to triage about sinus problems and cough she has been having for a month now. Pt does not want to see anyone besides [place] and there was no openings until 07/29/18. Please advise

## 2018-07-23 NOTE — Telephone Encounter (Signed)
Duplicate encounter

## 2018-07-26 NOTE — Telephone Encounter (Signed)
noted 

## 2018-07-26 NOTE — Telephone Encounter (Signed)
Called and spoke with patient. She reports slight cough and nasal drainage. States it has been off and on depending on if she is taking allergy medications. States this has been going on since November. Appointment scheduled for Wednesday.

## 2018-07-28 ENCOUNTER — Encounter: Payer: Self-pay | Admitting: Family Medicine

## 2018-07-28 ENCOUNTER — Ambulatory Visit (INDEPENDENT_AMBULATORY_CARE_PROVIDER_SITE_OTHER): Payer: Medicare Other | Admitting: Family Medicine

## 2018-07-28 VITALS — BP 132/84 | HR 84 | Temp 98.2°F | Ht 61.0 in | Wt 215.0 lb

## 2018-07-28 DIAGNOSIS — J329 Chronic sinusitis, unspecified: Secondary | ICD-10-CM

## 2018-07-28 DIAGNOSIS — B9689 Other specified bacterial agents as the cause of diseases classified elsewhere: Secondary | ICD-10-CM | POA: Diagnosis not present

## 2018-07-28 DIAGNOSIS — I10 Essential (primary) hypertension: Secondary | ICD-10-CM

## 2018-07-28 MED ORDER — AMOXICILLIN-POT CLAVULANATE 875-125 MG PO TABS: 1.0000 | ORAL_TABLET | Freq: Two times a day (BID) | ORAL | 0 refills | Status: AC

## 2018-07-28 NOTE — Patient Instructions (Signed)
Health Maintenance Due  Topic Date Due  . COLONOSCOPY-still need to get this done-we could do Cologuard instead 03/13/2018   I am concerned given the nearly month-long course of symptoms and particularly continued sinus pressure of potential bacterial sinusitis.  Since Ms. Lehrmann is feeling better today she wants to hold off on antibiotics- she is going to trial either Allegra, Claritin or Zyrtec once a night for 5 nights if symptoms worsen and if she does not improve she agrees to take the Augmentin antibiotic I sent in for her.  If she develops shortness of breath again-needs to return to see [place] and we would need to get a chest x-ray.  If she fails to improve with the above treatments we need to see her back.  I wished her a happy holiday season and hopefully this illness will not get in her way.

## 2018-07-28 NOTE — Progress Notes (Signed)
Subjective:  Shelly Johnson is a 72 y.o. year old very pleasant female patient who presents for/with See problem oriented charting ROS-no fever or chills.  Has had some shortness of breath which has improved.  Does have sinus pressure and some postnasal drip.  No ear pressure.  No chest pain reported.  Past Medical History-  Patient Active Problem List   Diagnosis Date Noted  . Depression, major, single episode, moderate ([place]) 06/20/2017    Priority: Medium  . Essential hypertension 08/23/2016    Priority: Medium  . ANEMIA-NOS 03/16/2008    Priority: Medium  . Morbid obesity ([place]) 04/28/2018    Priority: Low  . Hiatal hernia with gastroesophageal reflux 03/08/2008    Priority: Low  . Numbness and tingling of both feet 01/21/2018  . Dyspnea on exertion 08/23/2016    Medications- reviewed and updated Current Outpatient Medications  Medication Sig Dispense Refill  . chlorthalidone (HYGROTON) 25 MG tablet Take 1 tablet (25 mg total) by mouth daily. 90 tablet 3  . ferrous sulfate 325 (65 FE) MG tablet Take 1 tablet (325 mg total) by mouth 2 (two) times daily. 60 tablet 3  . omeprazole (PRILOSEC) 40 MG capsule Take 1 capsule (40 mg total) by mouth 2 (two) times daily. 180 capsule 3  . vitamin C (ASCORBIC ACID) 500 MG tablet Take 500 mg by mouth daily.       No current facility-administered medications for this visit.     Objective: BP 132/84 (BP Location: Left Arm, Patient Position: Sitting, Cuff Size: Normal)   Pulse 84   Temp 98.2 F (36.8 C) (Oral)   Ht 5\' 1"  (1.549 m)   Wt 215 lb (97.5 kg)   SpO2 96%   BMI 40.62 kg/m  Gen: NAD, resting comfortably Tympanic membrane is normal, pharynx with mild erythema-otherwise oral cavity normal.  Nasal turbinates mildly erythematous.  Tender over maxillary sinuses CV: RRR no murmurs rubs or gallops Lungs: CTAB no crackles, wheeze, rhonchi Abdomen: soft/nontender/nondistended Ext: no edema Skin: warm, dry  Assessment/Plan:   Bacterial sinusitis  Essential hypertension  S: November 4th got very congsted with bad cough , chest congestion, sinus congestion- for about 2 weeks- did some home treatments after calling in here. Coughing so much that ribs hurt at first . Week 3 she got better and thought she was done with the illness then got chest and sinus congestion again which lasted 3-4 days then she felt better- seems to come and go- at present feels better since at least yesterday other than continued drip in throat as well as some sinus congestion/pressure. She tried an allergy tablet with decongestant and she states she felt better for one of the rounds a few days and when stopped symptoms worsened. Thankfully BP was ok on medicine.   Once again-her main symptoms today are some mild sinus pressure and throat drip and mild cough.  She states did feel some shortness of breath with activities when was feeling more ill-denies this at present A/P: I am concerned given the nearly month-long course of symptoms and particularly continued sinus pressure of potential bacterial sinusitis.  Since Shelly Johnson is feeling better today she wants to hold off on antibiotics- she is going to trial either Allegra, Claritin or Zyrtec once a night for 5 nights if symptoms worsen and if she does not improve she agrees to take the Augmentin antibiotic I sent in for her.  If she develops shortness of breath again-needs to return to see [place] and  we would need to get a chest x-ray.  If she fails to improve with the above treatments we need to see her back.  I wished her a happy holiday season and hopefully this illness will not get in her way.  Hypertension fortunately controlled today-with her history of hypertension I recommended she avoid decongestants  Future Appointments  Date Time Provider [place]  03/10/2019  3:00 PM [facility]   Meds ordered this encounter  Medications  . amoxicillin-clavulanate (AUGMENTIN)  875-125 MG tablet    Sig: Take 1 tablet by mouth 2 (two) times daily for 7 days.    Dispense:  14 tablet    Refill:  0    Return precautions advised.  Garret Reddish, MD

## 2018-08-16 ENCOUNTER — Encounter: Payer: Self-pay | Admitting: Family Medicine

## 2018-08-16 ENCOUNTER — Ambulatory Visit (INDEPENDENT_AMBULATORY_CARE_PROVIDER_SITE_OTHER): Payer: Medicare Other | Admitting: Family Medicine

## 2018-08-16 ENCOUNTER — Ambulatory Visit (INDEPENDENT_AMBULATORY_CARE_PROVIDER_SITE_OTHER): Payer: Medicare Other

## 2018-08-16 VITALS — BP 128/80 | HR 92 | Temp 98.1°F | Ht 61.0 in | Wt 215.0 lb

## 2018-08-16 DIAGNOSIS — J301 Allergic rhinitis due to pollen: Secondary | ICD-10-CM

## 2018-08-16 DIAGNOSIS — B9689 Other specified bacterial agents as the cause of diseases classified elsewhere: Secondary | ICD-10-CM

## 2018-08-16 DIAGNOSIS — J984 Other disorders of lung: Secondary | ICD-10-CM | POA: Diagnosis not present

## 2018-08-16 DIAGNOSIS — R0602 Shortness of breath: Secondary | ICD-10-CM | POA: Diagnosis not present

## 2018-08-16 DIAGNOSIS — J329 Chronic sinusitis, unspecified: Secondary | ICD-10-CM | POA: Diagnosis not present

## 2018-08-16 MED ORDER — AMOXICILLIN-POT CLAVULANATE 875-125 MG PO TABS: 1.0000 | ORAL_TABLET | Freq: Two times a day (BID) | ORAL | 0 refills | Status: AC

## 2018-08-16 NOTE — Patient Instructions (Signed)
Health Maintenance Due  Topic Date Due  . COLONOSCOPY  03/13/2018   Start augmentin for sinus infection  We are going to wait on the radiology overread for your x-ray. If they think this is pneumonia we will add azithromycin to your regimen

## 2018-08-16 NOTE — Progress Notes (Signed)
Subjective:  Shelly Johnson is a 72 y.o. year old very pleasant female patient who presents for/with See problem oriented charting ROS-no fever chills reported.  Reports shortness of breath.  Cannot breathe through her nose  Past Medical History-  Patient Active Problem List   Diagnosis Date Noted  . Depression, major, single episode, moderate ([place]) 06/20/2017    Priority: Medium  . Essential hypertension 08/23/2016    Priority: Medium  . ANEMIA-NOS 03/16/2008    Priority: Medium  . Morbid obesity ([place]) 04/28/2018    Priority: Low  . Hiatal hernia with gastroesophageal reflux 03/08/2008    Priority: Low  . Numbness and tingling of both feet 01/21/2018  . Dyspnea on exertion 08/23/2016    Medications- reviewed and updated Current Outpatient Medications  Medication Sig Dispense Refill  . chlorthalidone (HYGROTON) 25 MG tablet Take 1 tablet (25 mg total) by mouth daily. 90 tablet 3  . ferrous sulfate 325 (65 FE) MG tablet Take 1 tablet (325 mg total) by mouth 2 (two) times daily. 60 tablet 3  . omeprazole (PRILOSEC) 40 MG capsule Take 1 capsule (40 mg total) by mouth 2 (two) times daily. 180 capsule 3  . vitamin C (ASCORBIC ACID) 500 MG tablet Take 500 mg by mouth daily.      Marland Kitchen amoxicillin-clavulanate (AUGMENTIN) 875-125 MG tablet Take 1 tablet by mouth 2 (two) times daily for 7 days. 14 tablet 0   No current facility-administered medications for this visit.     Objective: BP 128/80 (BP Location: Left Arm, Patient Position: Sitting, Cuff Size: Normal)   Pulse 92   Temp 98.1 F (36.7 C) (Oral)   Ht 5\' 1"  (1.549 m)   Wt 215 lb (97.5 kg)   SpO2 96%   BMI 40.62 kg/m  Gen: Frequent coughing, blowing nose, appears uncomfortable Bilateral maxillary and sinus tenderness noted.  Yellow sinus discharge noted. CV: RRR no murmurs rubs or gallops Lungs: CTAB no crackles, wheeze, rhonchi Abdomen: soft/nontender/nondistended Ext: no edema Skin: warm, dry  Dg Chest 2 View  Result  Date: 08/17/2018 CLINICAL DATA:  Shortness of breath and cough five days. EXAM: CHEST - 2 VIEW COMPARISON:  None. FINDINGS: Heart size is normal. A large hiatal hernia is again seen. Mild left basilar airspace disease is present. There is no effusion. The right lung is clear. No significant edema is present. The visualized soft tissues and bony thorax are unremarkable. IMPRESSION: 1. Large hiatal hernia. 2. Mild left basilar airspace disease likely reflects atelectasis. Electronically Signed   By: San Morelle M.D.   On: 08/17/2018 06:41   Assessment/Plan:  Bacterial sinusitis/allergic rhinitis  s: Patient was seen December 11-concern at that time was for allergic rhinitis versus bacterial sinusitis-I favored bacterial sinusitis given duration of illness.  a   Was doing better with allegra after 5 days.  Then stopped Never went away completely but at least 75% better Then things got busy with holidays and moving son out of his home- she was exposed to a lot of dust/debris and worked really hard- by Friday after Christmas felt stripped of all strength and congestion got way worse.  She never ended up taking her Augmentin.  Started with recurrence of symptoms on Friday.  The never fully resolved-sinus pressure is intense again- some pressure in ears as well. shortness of breath after Friday. Wheezing some with this. Walked 2 blocks and felt really winded which is abnormal for her. Has aggrivating cough. In morning- eyes and nose hurts. Some mild  numbness in right leg and down into foot.   Needs to drive to [place] to see brother in ICU- who may be dying A/P: I think patient either has untreated bacterial sinusitis or a flareup of allergies again.  I want to go ahead and treat her with both in mind-Allegra for a month and Augmentin for 7 days.  She agrees to start these given almost 2 months total of symptoms.  She may have some reactive airways-suggested possible prednisone but she declines due  to prior weight gain on this.  Her x-ray shows no obvious pneumonia- likely left basilar atelectasis per radiology-if she does not improve with above treatment would likely add atypical bacterial coverage with azithromycin or doxycycline  I personally reviewed the x-ray with patient- I told her I thought there was some mild bronchitic changes in addition to the noted hiatal hernia.  We reviewed slight opacity in left lower lung-which goes along with atelectasis as noted by radiology.  Once again would treat this if she fails to improve from shortness of breath perspective with atypical bacterial coverage   Future Appointments  Date Time Provider [place]  03/10/2019  3:00 PM [facility]   Lab/Order associations: Shortness of breath - Plan: DG Chest 2 View, DG Chest 2 View  Meds ordered this encounter  Medications  . amoxicillin-clavulanate (AUGMENTIN) 875-125 MG tablet    Sig: Take 1 tablet by mouth 2 (two) times daily for 7 days.    Dispense:  14 tablet    Refill:  0    Return precautions advised.  Garret Reddish, MD

## 2018-09-03 ENCOUNTER — Other Ambulatory Visit: Payer: Self-pay | Admitting: Family Medicine

## 2018-10-06 ENCOUNTER — Ambulatory Visit: Payer: Self-pay

## 2018-10-06 NOTE — Telephone Encounter (Signed)
Pt called to say she has had numbness and pain for 2 weeks to her right lower back traveling down her right leg. She states that everything feels tight. She rates the pain at 5-6. She states that it is constant and annoying. Certain movement causes sharp pain. She denies strain or injury to her back. She has tried tylenol and aleve for the pain. She denies loss of bowel and bladder control.  Pt is presently in [place]. Per protocol pt will go to urgent care or ER for evaluation of her symptoms. Care advice read to patient. Pt verbalized understanding of all instructions.  Reason for Disposition . [1] Pain radiates into the thigh or further down the leg AND [2] both legs    Pt out of town. Will go to ER or urgent care.  Answer Assessment - Initial Assessment Questions 1. ONSET: "When did the pain begin?"      2weeks ago 2. LOCATION: "Where does it hurt?" (upper, mid or lower back)     Lower back buttock and leg rt side 3. SEVERITY: "How bad is the pain?"  (e.g., Scale 1-10; mild, moderate, or severe)   - MILD (1-3): doesn't interfere with normal activities    - MODERATE (4-7): interferes with normal activities or awakens from sleep    - SEVERE (8-10): excruciating pain, unable to do any normal activities      5-6 4. PATTERN: "Is the pain constant?" (e.g., yes, no; constant, intermittent)      constant 5. RADIATION: "Does the pain shoot into your legs or elsewhere?"     Leg into foot 6. CAUSE:  "What do you think is causing the back pain?"      Sciatica, but does not know 7. BACK OVERUSE:  "Any recent lifting of heavy objects, strenuous work or exercise?"     Helped with moving but it didn't start then 8. MEDICATIONS: "What have you taken so far for the pain?" (e.g., nothing, acetaminophen, NSAIDS)     Tylenol, aleve, 9. NEUROLOGIC SYMPTOMS: "Do you have any weakness, numbness, or problems with bowel/bladder control?"   Numbness in leg and toes rt side feel tight 10. OTHER SYMPTOMS: "Do you  have any other symptoms?" (e.g., fever, abdominal pain, burning with urination, blood in urine)       no 11. PREGNANCY: "Is there any chance you are pregnant?" (e.g., yes, no; LMP)       No  Protocols used: BACK PAIN-A-AH

## 2018-10-07 DIAGNOSIS — I10 Essential (primary) hypertension: Secondary | ICD-10-CM | POA: Diagnosis not present

## 2018-10-07 DIAGNOSIS — M5116 Intervertebral disc disorders with radiculopathy, lumbar region: Secondary | ICD-10-CM | POA: Diagnosis not present

## 2018-10-07 DIAGNOSIS — M5136 Other intervertebral disc degeneration, lumbar region: Secondary | ICD-10-CM | POA: Diagnosis not present

## 2018-10-07 DIAGNOSIS — M5441 Lumbago with sciatica, right side: Secondary | ICD-10-CM | POA: Diagnosis not present

## 2018-10-07 DIAGNOSIS — M5430 Sciatica, unspecified side: Secondary | ICD-10-CM | POA: Diagnosis not present

## 2018-11-16 ENCOUNTER — Ambulatory Visit (INDEPENDENT_AMBULATORY_CARE_PROVIDER_SITE_OTHER): Payer: Medicare Other | Admitting: Family Medicine

## 2018-11-16 ENCOUNTER — Encounter: Payer: Self-pay | Admitting: Family Medicine

## 2018-11-16 ENCOUNTER — Other Ambulatory Visit: Payer: Self-pay | Admitting: Family Medicine

## 2018-11-16 VITALS — BP 129/78 | HR 96 | Ht 61.0 in | Wt 215.0 lb

## 2018-11-16 DIAGNOSIS — K219 Gastro-esophageal reflux disease without esophagitis: Secondary | ICD-10-CM | POA: Diagnosis not present

## 2018-11-16 DIAGNOSIS — M5136 Other intervertebral disc degeneration, lumbar region: Secondary | ICD-10-CM

## 2018-11-16 DIAGNOSIS — I10 Essential (primary) hypertension: Secondary | ICD-10-CM | POA: Diagnosis not present

## 2018-11-16 DIAGNOSIS — M51369 Other intervertebral disc degeneration, lumbar region without mention of lumbar back pain or lower extremity pain: Secondary | ICD-10-CM | POA: Insufficient documentation

## 2018-11-16 DIAGNOSIS — K449 Diaphragmatic hernia without obstruction or gangrene: Secondary | ICD-10-CM

## 2018-11-16 DIAGNOSIS — Z1211 Encounter for screening for malignant neoplasm of colon: Secondary | ICD-10-CM

## 2018-11-16 MED ORDER — OMEPRAZOLE 40 MG PO CPDR: 40.0000 mg | DELAYED_RELEASE_CAPSULE | Freq: Every day | ORAL | 3 refills | Status: DC

## 2018-11-16 MED ORDER — CHLORTHALIDONE 25 MG PO TABS: 25.0000 mg | ORAL_TABLET | Freq: Every day | ORAL | 3 refills | Status: DC

## 2018-11-16 NOTE — Progress Notes (Addendum)
Phone [phone]   Subjective:  Virtual visit via phonenote  Our team/I connected with Shelly Johnson on 11/16/18 at  4:20 PM EDT by phone (patient did not have equipment for webex) and verified that I am speaking with the correct person using two identifiers.  Location patient: [facility] Location provider: [facility], office Persons participating in the virtual visit: patient  Our team/I discussed the limitations of evaluation and management by telemedicine and the availability of in person appointments. In light of current covid-19 pandemic, patient also understands that we are trying to protect them by minimizing in office contact if at all possible.  The patient expressed consent for telemedicine visit and agreed to proceed.   Time on phone together: 15 minutes  ROS- No chest pain or shortness of breath. No headache or blurry vision. No fever or cough.    Past Medical History-  Patient Active Problem List   Diagnosis Date Noted  . Depression, major, single episode, moderate ([place]) 06/20/2017    Priority: Medium  . Essential hypertension 08/23/2016    Priority: Medium  . ANEMIA-NOS 03/16/2008    Priority: Medium  . Morbid obesity ([place]) 04/28/2018    Priority: Low  . Hiatal hernia with gastroesophageal reflux 03/08/2008    Priority: Low  . DDD (degenerative disc disease), lumbar 11/16/2018  . Numbness and tingling of both feet 01/21/2018  . Dyspnea on exertion 08/23/2016    Medications- reviewed and updated Current Outpatient Medications  Medication Sig Dispense Refill  . chlorthalidone (HYGROTON) 25 MG tablet Take 1 tablet (25 mg total) by mouth daily. 90 tablet 3  . ferrous sulfate 325 (65 FE) MG tablet Take 1 tablet (325 mg total) by mouth 2 (two) times daily. 60 tablet 3  . omeprazole (PRILOSEC) 40 MG capsule Take 1 capsule (40 mg total) by mouth daily. 90 capsule 3  . vitamin C (ASCORBIC ACID) 500 MG tablet Take 500 mg by mouth daily.       No current  facility-administered medications for this visit.      Objective:  BP 129/78   Pulse 96   Ht 5\' 1"  (1.549 m)   Wt 215 lb (97.5 kg)   BMI 40.62 kg/m  No obvious distress by phone Nonlabored breathing, no obvious wheeze Normal speech    Assessment and Plan   #hypertension S: controlled on chlorthalidone 25 mg BP Readings from Last 3 Encounters:  11/16/18 129/78  08/16/18 128/80  07/28/18 132/84  A/P: Stable. Continue current medications.   #GERD S: acid reflux is controlled on omeprazole 40mg  BID. Has had no regular reflux symptoms on twice a day dosing.  A/P: Patient has had reasonably controlled reflux on twice daily dosing-we opted to reduce to once daily dosing-she agrees to let me know if symptoms worsen  #Degenerative disc disease/sciatica S:Ct scan in [place]- DDD was shown. Diagnosed 3-4 weeks ago with sciatica.  Pain and numbness in right leg Thursday before superbowl started and has persisted- muscle relaxant and aleve were tried without relief.  A/P: Patient with sciatica symptoms-never has tried prednisone-offered this but she states she "blows up" on this.  I also offered a referral to Dr. Paulla Fore of sports medicine which she also declined.  She will let me know if she has worsening symptoms or fails to improve.    Future Appointments  Date Time Provider [place]  03/10/2019  3:00 PM [facility]   Lab/Order associations: Screening for colon cancer - Plan: Cologuard  DDD (  degenerative disc disease), lumbar  Meds ordered this encounter  Medications  . omeprazole (PRILOSEC) 40 MG capsule    Sig: Take 1 capsule (40 mg total) by mouth daily.    Dispense:  90 capsule    Refill:  3  . chlorthalidone (HYGROTON) 25 MG tablet    Sig: Take 1 tablet (25 mg total) by mouth daily.    Dispense:  90 tablet    Refill:  3    Return precautions advised.  Garret Reddish, MD

## 2018-11-16 NOTE — Telephone Encounter (Signed)
Patient is scheduled for 11/16/18 @ 4:20pm

## 2018-11-16 NOTE — Patient Instructions (Addendum)
Health Maintenance Due  Topic Date Due  . COLONOSCOPY Cologuard  03/13/2018    Phone visit

## 2018-11-16 NOTE — Assessment & Plan Note (Signed)
S:Ct scan in [place]- DDD was shown. Diagnosed 3-4 weeks ago with sciatica.  Pain and numbness in right leg Thursday before superbowl started and has persisted- muscle relaxant and aleve were tried without relief.  A/P: Patient with sciatica symptoms-never has tried prednisone-offered this but she states she "blows up" on this.  I also offered a referral to Dr. Paulla Fore of sports medicine which she also declined.  She will let me know if she has worsening symptoms or fails to improve.

## 2018-11-16 NOTE — Telephone Encounter (Signed)
Schedule webex for refills

## 2018-11-19 NOTE — Progress Notes (Signed)
We have to fax Cologuard form to [employer] and we don't have to do anything else

## 2018-12-21 DIAGNOSIS — Z1211 Encounter for screening for malignant neoplasm of colon: Secondary | ICD-10-CM | POA: Diagnosis not present

## 2018-12-29 LAB — COLOGUARD: Cologuard: NEGATIVE

## 2019-01-12 ENCOUNTER — Telehealth: Payer: Self-pay | Admitting: Family Medicine

## 2019-01-12 NOTE — Telephone Encounter (Signed)
-----   Message from [name], MD sent at 01/12/2019  8:04 AM EDT ----- Make phone note- encourage patient to complete cologuard  Thanks, Garret Reddish

## 2019-01-14 NOTE — Telephone Encounter (Signed)
Spoke to pt and she stated that she completed the Cologuard 3 weeks ago. No further action needed at this time!

## 2019-01-19 NOTE — Telephone Encounter (Signed)
Returned pt's call and informed her of her Cologuard results.  Pt verbalized understanding and had no further questions.

## 2019-01-19 NOTE — Telephone Encounter (Signed)
Pt called for cologuard results / please advise

## 2019-03-10 ENCOUNTER — Ambulatory Visit: Payer: Medicare Other

## 2019-06-24 ENCOUNTER — Telehealth: Payer: Self-pay | Admitting: Family Medicine

## 2019-06-24 NOTE — Telephone Encounter (Signed)
I called the patient to schedule AWV with Loma Sousa, but there was no answer and no option to leave a message. If patient calls back, please schedule Medicare Wellness Visit at next available opening.  VDM (Dee-Dee)

## 2019-07-13 ENCOUNTER — Other Ambulatory Visit: Payer: Self-pay

## 2019-07-26 ENCOUNTER — Other Ambulatory Visit: Payer: Self-pay

## 2019-07-26 DIAGNOSIS — Z20828 Contact with and (suspected) exposure to other viral communicable diseases: Secondary | ICD-10-CM | POA: Diagnosis not present

## 2019-07-26 DIAGNOSIS — Z20822 Contact with and (suspected) exposure to covid-19: Secondary | ICD-10-CM

## 2019-07-27 LAB — NOVEL CORONAVIRUS, NAA: SARS-CoV-2, NAA: NOT DETECTED

## 2019-09-09 ENCOUNTER — Telehealth: Payer: Self-pay | Admitting: Family Medicine

## 2019-09-09 ENCOUNTER — Other Ambulatory Visit: Payer: Self-pay | Admitting: Family Medicine

## 2019-09-09 DIAGNOSIS — Z1231 Encounter for screening mammogram for malignant neoplasm of breast: Secondary | ICD-10-CM

## 2019-09-09 NOTE — Telephone Encounter (Signed)
Pt called stating she has been having diarrhea since Monday. She is feeling very fatigued and has a headache. Pt wants to know what she should do and if a nurse can call her back. Please advise.

## 2019-09-09 NOTE — Telephone Encounter (Signed)
Please schedule pt a virtual visit with a provider if Shelly Johnson does not have anything available.

## 2019-09-09 NOTE — Telephone Encounter (Signed)
Please advise 

## 2019-09-12 NOTE — Telephone Encounter (Signed)
Called and LVM to scheduled appt

## 2019-09-12 NOTE — Telephone Encounter (Signed)
Please call for app  

## 2019-09-14 ENCOUNTER — Ambulatory Visit (INDEPENDENT_AMBULATORY_CARE_PROVIDER_SITE_OTHER): Payer: Medicare Other | Admitting: Family Medicine

## 2019-09-14 ENCOUNTER — Ambulatory Visit: Payer: Medicare Other | Attending: Internal Medicine

## 2019-09-14 ENCOUNTER — Encounter: Payer: Self-pay | Admitting: Family Medicine

## 2019-09-14 VITALS — BP 131/80 | Ht 61.0 in | Wt 218.0 lb

## 2019-09-14 DIAGNOSIS — E785 Hyperlipidemia, unspecified: Secondary | ICD-10-CM

## 2019-09-14 DIAGNOSIS — R739 Hyperglycemia, unspecified: Secondary | ICD-10-CM

## 2019-09-14 DIAGNOSIS — F321 Major depressive disorder, single episode, moderate: Secondary | ICD-10-CM

## 2019-09-14 DIAGNOSIS — R5383 Other fatigue: Secondary | ICD-10-CM

## 2019-09-14 DIAGNOSIS — R202 Paresthesia of skin: Secondary | ICD-10-CM

## 2019-09-14 DIAGNOSIS — Z20822 Contact with and (suspected) exposure to covid-19: Secondary | ICD-10-CM | POA: Diagnosis not present

## 2019-09-14 DIAGNOSIS — R1030 Lower abdominal pain, unspecified: Secondary | ICD-10-CM

## 2019-09-14 DIAGNOSIS — Z1159 Encounter for screening for other viral diseases: Secondary | ICD-10-CM

## 2019-09-14 DIAGNOSIS — I10 Essential (primary) hypertension: Secondary | ICD-10-CM | POA: Diagnosis not present

## 2019-09-14 NOTE — Patient Instructions (Signed)
Health Maintenance Due  Topic Date Due  . Hepatitis C Screening  1946/01/02  . COLONOSCOPY  03/13/2018  . INFLUENZA VACCINE  03/19/2019   Depression screen [facility] 2/9 11/16/2018 03/04/2018 06/19/2017  Decreased Interest 0 1 1  Down, Depressed, Hopeless 0 0 1  PHQ - 2 Score 0 1 2  Altered sleeping 1 - 0  Tired, decreased energy 1 - 3  Change in appetite 0 - 2  Feeling bad or failure about yourself  0 - 1  Trouble concentrating 0 - 3  Moving slowly or fidgety/restless 0 - 0  Suicidal thoughts 0 - 0  PHQ-9 Score 2 - 11  Difficult doing work/chores Not difficult at all - Somewhat difficult

## 2019-09-14 NOTE — Progress Notes (Signed)
Phone [phone] Virtual visit via Video note   Subjective:  Chief complaint: Chief Complaint  Patient presents with  . virtual  . Headache  . Abdominal Pain  . Fatigue    This visit type was conducted due to national recommendations for restrictions regarding the COVID-19 Pandemic (e.g. social distancing).  This format is felt to be most appropriate for this patient at this time balancing risks to patient and risks to population by having him in for in person visit.  No physical exam was performed (except for noted visual exam or audio findings with Telehealth visits).    Our team/I connected with [name] at 10:00 AM EST by a video enabled telemedicine application (doxy.me or caregility through epic) and verified that I am speaking with the correct person using two identifiers.  Location patient: [facility] Location provider: [facility], office Persons participating in the virtual visit:  patient  Our team/I discussed the limitations of evaluation and management by telemedicine and the availability of in person appointments. In light of current covid-19 pandemic, patient also understands that we are trying to protect them by minimizing in office contact if at all possible.  The patient expressed consent for telemedicine visit and agreed to proceed. Patient understands insurance will be billed.   Past Medical History-  Patient Active Problem List   Diagnosis Date Noted  . Hyperlipidemia, unspecified 09/14/2019    Priority: Medium  . Numbness and tingling of both feet 01/21/2018    Priority: Medium  . Depression, major, single episode, moderate ([place]) 06/20/2017    Priority: Medium  . Essential hypertension 08/23/2016    Priority: Medium  . ANEMIA-NOS 03/16/2008    Priority: Medium  . Morbid obesity ([facility]) 04/28/2018    Priority: Low  . Hiatal hernia with gastroesophageal reflux 03/08/2008    Priority: Low  . DDD (degenerative disc disease), lumbar 11/16/2018  .  Dyspnea on exertion 08/23/2016    Medications- reviewed and updated Current Outpatient Medications  Medication Sig Dispense Refill  . chlorthalidone (HYGROTON) 25 MG tablet Take 1 tablet (25 mg total) by mouth daily. 90 tablet 3  . ferrous sulfate 325 (65 FE) MG tablet Take 1 tablet (325 mg total) by mouth 2 (two) times daily. 60 tablet 3  . omeprazole (PRILOSEC) 40 MG capsule Take 1 capsule (40 mg total) by mouth daily. 90 capsule 3  . vitamin C (ASCORBIC ACID) 500 MG tablet Take 500 mg by mouth daily.       No current facility-administered medications for this visit.     Objective:  BP 131/80   Ht 5\' 1"  (1.549 m)   Wt 218 lb (98.9 kg)   BMI 41.19 kg/m  self reported vitals Gen: NAD, resting comfortably Lungs: nonlabored, normal respiratory rate  Skin: appears dry, no obvious rash     Assessment and Plan   Abdominal pain/HA/Fatigue S: pt states she has been having abdominal pain HA and fatigue which started on 09/04/2019- started night before with a lot of diarrhea. Feels like she may have gotten dehydrated- she continued chlorthalidone during the time (discussed could hold in future if GI losses and then restart when improving- monitor BP whole time).   She has lower mild abdominal cramping and she had diarrhea after cramping and she is feeling "wobbly".  She has had chills and hot flashes. First few days was every 30 minutes and by today diarrhea has resolved and getting some form to her stools.    she has taken a  covid test before christmas and it was negative. She states her appetite is ok just eating chicken soup and cheese/crackers. PT states her toes are numb and everything hurts. A/P: I suspect patient had viral gastroenteritis.  Need to rule out COVID-19-recommended testing through [facility] health website.  She has also had some runny nose but that have been going on for years and do not suspect that is related to COVID-19.  If her test is negative we agreed to have her come  by the office to update blood work as it has been a while since this was updated-can also make sure no reduced kidney function from being on diuretic while having GI losses. -Patient has history of anemia and is on B12 and iron.  Prior sense of work-up per patient which was unrevealing-never saw hematology.  We will make sure anemia has not worsened in regards to fatigue -Still with some numbness and tingling in her feet as well as a sensation of walking on rocks-check potential neuropathy labs -Not clear fasting but has had some elevated sugars in the past-we will also check A1c with labs-also to evaluate for diabetic neuropathy  #hypertension S: compliant with chlorthalidone 25 mg BP Readings from Last 3 Encounters:  09/14/19 131/80  11/16/18 129/78  08/16/18 128/80  A/P: Stable. Continue current medications.   # Depression S: in [place] without medicine. Feels liek she actually did well in 2020 Depression screen PHQ 2/9 09/14/2019  Decreased Interest 1  Down, Depressed, Hopeless 0  PHQ - 2 Score 1  Altered sleeping 0  Tired, decreased energy 1  Change in appetite 1  Feeling bad or failure about yourself  0  Trouble concentrating 0  Moving slowly or fidgety/restless 0  Suicidal thoughts 0  PHQ-9 Score 3  Difficult doing work/chores Not difficult at all  A/P: Depression remains in remission-had seen Dr. Glennon Hamilton in 2019 but has not felt the need to do that since then.  We will continue to monitor without medication.  #hyperlipidemia S: Patient not currently on statin Lab Results  Component Value Date   CHOL 194 07/18/2013   HDL 65.50 07/18/2013   LDLCALC 106 (H) 07/18/2013   TRIG 111.0 07/18/2013   CHOLHDL 3 07/18/2013   A/P: Has been a long time since we have obtained blood work-patient typically resistant to doing blood work as has fear of needles.  Since she was willing to come by for blood work we opted to check lipids as well.  Consider statin versus lifestyle changes if  ASCVD risk above 7.5%   Recommended follow up: As needed for new or worsening symptoms in regards to acute concerns Future Appointments  Date Time Provider [place]  12/22/2019 11:50 AM [facility] 2 [facility]    Lab/Order associations:   ICD-10-CM   1. Abdominal pain, lower  R10.30 CBC with Differential/Platelet    Comprehensive metabolic panel  2. Essential hypertension  I10 Lipid panel  3. Depression, major, single episode, moderate (HCC)  F32.1   4. Hyperlipidemia, unspecified hyperlipidemia type  E78.5   5. Encounter for hepatitis C screening test for low risk patient  Z11.59 Hepatitis C antibody  6. Fatigue, unspecified type  R53.83 TSH  7. Hyperglycemia  R73.9 Hemoglobin A1c  8. Paresthesias  R20.2 Hemoglobin A1c    Vitamin B12   Return precautions advised.  Garret Reddish, MD

## 2019-09-15 LAB — NOVEL CORONAVIRUS, NAA: SARS-CoV-2, NAA: NOT DETECTED

## 2019-09-16 ENCOUNTER — Other Ambulatory Visit: Payer: Self-pay

## 2019-09-16 ENCOUNTER — Other Ambulatory Visit (INDEPENDENT_AMBULATORY_CARE_PROVIDER_SITE_OTHER): Payer: Medicare Other

## 2019-09-16 DIAGNOSIS — R1030 Lower abdominal pain, unspecified: Secondary | ICD-10-CM

## 2019-09-16 DIAGNOSIS — I10 Essential (primary) hypertension: Secondary | ICD-10-CM | POA: Diagnosis not present

## 2019-09-16 DIAGNOSIS — R202 Paresthesia of skin: Secondary | ICD-10-CM | POA: Diagnosis not present

## 2019-09-16 DIAGNOSIS — R5383 Other fatigue: Secondary | ICD-10-CM | POA: Diagnosis not present

## 2019-09-16 DIAGNOSIS — Z1159 Encounter for screening for other viral diseases: Secondary | ICD-10-CM

## 2019-09-16 DIAGNOSIS — R739 Hyperglycemia, unspecified: Secondary | ICD-10-CM | POA: Diagnosis not present

## 2019-09-16 LAB — COMPREHENSIVE METABOLIC PANEL
ALT: 39 U/L — ABNORMAL HIGH (ref 0–35)
AST: 22 U/L (ref 0–37)
Albumin: 4.2 g/dL (ref 3.5–5.2)
Alkaline Phosphatase: 56 U/L (ref 39–117)
BUN: 16 mg/dL (ref 6–23)
CO2: 33 mEq/L — ABNORMAL HIGH (ref 19–32)
Calcium: 9.2 mg/dL (ref 8.4–10.5)
Chloride: 98 mEq/L (ref 96–112)
Creatinine, Ser: 0.88 mg/dL (ref 0.40–1.20)
GFR: 63.22 mL/min (ref >60.00)
Glucose, Bld: 96 mg/dL (ref 70–99)
Potassium: 3.5 mEq/L (ref 3.5–5.1)
Sodium: 140 mEq/L (ref 135–145)
Total Bilirubin: 0.5 mg/dL (ref 0.2–1.2)
Total Protein: 6.8 g/dL (ref 6.0–8.3)

## 2019-09-16 LAB — VITAMIN B12: Vitamin B-12: 677 pg/mL (ref 211–911)

## 2019-09-16 LAB — LIPID PANEL
Cholesterol: 199 mg/dL (ref 0–200)
HDL: 53 mg/dL (ref >39.00)
NonHDL: 145.63
Total CHOL/HDL Ratio: 4
Triglycerides: 319 mg/dL — ABNORMAL HIGH (ref 0.0–149.0)
VLDL: 63.8 mg/dL — ABNORMAL HIGH (ref 0.0–40.0)

## 2019-09-16 LAB — CBC WITH DIFFERENTIAL/PLATELET
Basophils Absolute: 0.1 10*3/uL (ref 0.0–0.1)
Basophils Relative: 0.5 % (ref 0.0–3.0)
Eosinophils Absolute: 0.1 10*3/uL (ref 0.0–0.7)
Eosinophils Relative: 1.1 % (ref 0.0–5.0)
HCT: 47.6 % — ABNORMAL HIGH (ref 36.0–46.0)
Hemoglobin: 15.9 g/dL — ABNORMAL HIGH (ref 12.0–15.0)
Lymphocytes Relative: 24.5 % (ref 12.0–46.0)
Lymphs Abs: 2.4 10*3/uL (ref 0.7–4.0)
MCHC: 33.4 g/dL (ref 30.0–36.0)
MCV: 91.6 fl (ref 78.0–100.0)
Monocytes Absolute: 0.5 10*3/uL (ref 0.1–1.0)
Monocytes Relative: 4.7 % (ref 3.0–12.0)
Neutro Abs: 6.8 10*3/uL (ref 1.4–7.7)
Neutrophils Relative %: 69.2 % (ref 43.0–77.0)
Platelets: 190 10*3/uL (ref 150.0–400.0)
RBC: 5.19 Mil/uL — ABNORMAL HIGH (ref 3.87–5.11)
RDW: 13.5 % (ref 11.5–15.5)
WBC: 9.8 10*3/uL (ref 4.0–10.5)

## 2019-09-16 LAB — HEMOGLOBIN A1C: Hgb A1c MFr Bld: 6.1 % (ref 4.6–6.5)

## 2019-09-16 LAB — TSH: TSH: 3.09 u[IU]/mL (ref 0.35–4.50)

## 2019-09-16 LAB — LDL CHOLESTEROL, DIRECT: Direct LDL: 113 mg/dL

## 2019-09-19 LAB — HEPATITIS C ANTIBODY
Hepatitis C Ab: NONREACTIVE
SIGNAL TO CUT-OFF: 0.01 (ref <1.00)

## 2019-09-27 ENCOUNTER — Telehealth: Payer: Self-pay | Admitting: Family Medicine

## 2019-09-27 NOTE — Telephone Encounter (Signed)
Ok with you for her to have labs drawn at [facility]?

## 2019-09-27 NOTE — Telephone Encounter (Signed)
Patient wanted to relay a message to Dr.Hunter about how when she was here for lab work, because she has small rolling veins, she was stuck 3 times but would like to know if maybe next time she needs blood work if she could go to the [place] lab to a particular provider.

## 2019-09-27 NOTE — Telephone Encounter (Signed)
Patient wanted this put on place for any future lab appointments, does patient need to have labs done soon?

## 2019-09-27 NOTE — Telephone Encounter (Signed)
Ok, no mam she does not.

## 2019-09-27 NOTE — Telephone Encounter (Signed)
This is fine, you can schedule pt for [facility].

## 2019-09-27 NOTE — Telephone Encounter (Signed)
I have no issue with this if [facility] can accommodate

## 2019-11-02 ENCOUNTER — Telehealth: Payer: Self-pay | Admitting: Family Medicine

## 2019-11-02 NOTE — Telephone Encounter (Signed)
I left a message asking the patient to call and schedule Medicare AWV with Loma Sousa ([place]).  If patient calls back, please schedule Medicare Wellness Visit at next available opening. Last AWV 03/04/2018 [facility] ([place])

## 2019-12-21 ENCOUNTER — Ambulatory Visit: Payer: Medicare Other

## 2019-12-21 ENCOUNTER — Other Ambulatory Visit: Payer: Self-pay | Admitting: Family Medicine

## 2019-12-21 ENCOUNTER — Ambulatory Visit (INDEPENDENT_AMBULATORY_CARE_PROVIDER_SITE_OTHER): Payer: Medicare Other | Admitting: Podiatry

## 2019-12-21 ENCOUNTER — Other Ambulatory Visit: Payer: Self-pay

## 2019-12-21 DIAGNOSIS — M65171 Other infective (teno)synovitis, right ankle and foot: Secondary | ICD-10-CM

## 2019-12-21 DIAGNOSIS — M79671 Pain in right foot: Secondary | ICD-10-CM

## 2019-12-21 DIAGNOSIS — M659 Synovitis and tenosynovitis, unspecified: Secondary | ICD-10-CM

## 2019-12-21 DIAGNOSIS — L600 Ingrowing nail: Secondary | ICD-10-CM | POA: Diagnosis not present

## 2019-12-21 DIAGNOSIS — M79672 Pain in left foot: Secondary | ICD-10-CM

## 2019-12-21 MED ORDER — GENTAMICIN SULFATE 0.1 % EX CREA: 1.0000 "application " | TOPICAL_CREAM | Freq: Two times a day (BID) | CUTANEOUS | 1 refills | Status: DC

## 2019-12-22 ENCOUNTER — Ambulatory Visit
Admission: RE | Admit: 2019-12-22 | Discharge: 2019-12-22 | Disposition: A | Discharge status: Home / Self Care | Payer: Medicare Other | Source: Ambulatory Visit | Attending: Family Medicine | Admitting: Family Medicine

## 2019-12-22 ENCOUNTER — Other Ambulatory Visit: Payer: Self-pay | Admitting: Family Medicine

## 2019-12-22 DIAGNOSIS — N644 Mastodynia: Secondary | ICD-10-CM

## 2019-12-22 DIAGNOSIS — Z1231 Encounter for screening mammogram for malignant neoplasm of breast: Secondary | ICD-10-CM

## 2019-12-22 DIAGNOSIS — R234 Changes in skin texture: Secondary | ICD-10-CM

## 2019-12-22 DIAGNOSIS — T148XXA Other injury of unspecified body region, initial encounter: Secondary | ICD-10-CM

## 2019-12-27 NOTE — Progress Notes (Signed)
   Subjective: Patient presents today for evaluation of pain to the medial border of the right hallux that began a few weeks ago. Patient is concerned for possible ingrown nail. Applying pressure to the toe increases the pain. She has been applying triple antibiotic ointment and soaking the toe in Epsom salt for treatment.  She also reports bilateral ankle pain that has been ongoing for the past year. Walking and being on the feet for long periods of time increases the pain. She has not had any treatment for the symptoms. Patient presents today for further treatment and evaluation.  Past Medical History:  Diagnosis Date  . Anemia   . Anxiety and depression    during loss of husband  . Arthritis   . Diverticulosis   . GERD (gastroesophageal reflux disease)   . GI bleed    hx of; hgb as low as 5.9  . Hemorrhoids   . Hiatal hernia   . Obesity   . Pneumonia   . Urinary tract bacterial infections     Objective:  General: Well developed, nourished, in no acute distress, alert and oriented x3   Dermatology: Skin is warm, dry and supple bilateral. Medial border of the right hallux appears to be erythematous with evidence of an ingrowing nail. Pain on palpation noted to the border of the nail fold. The remaining nails appear unremarkable at this time. There are no open sores, lesions.  Vascular: Dorsalis Pedis artery and Posterior Tibial artery pedal pulses palpable. No lower extremity edema noted.   Neruologic: Grossly intact via light touch bilateral.  Musculoskeletal: Pain with palpation noted to the anterior, lateral and medial aspects of the right ankle. Muscular strength within normal limits in all groups bilateral. Normal range of motion noted to all pedal and ankle joints.   Assesement: #1 Paronychia with ingrowing nail medial border right hallux  #2 Pain in toe #3 Incurvated nail #4 Ankle synovitis right   Plan of Care:  1. Patient evaluated.  2. Discussed treatment  alternatives and plan of care. Explained nail avulsion procedure and post procedure course to patient. 3. Patient opted for permanent partial nail avulsion of the medial border right hallux.  4. Prior to procedure, local anesthesia infiltration utilized using 3 ml of a 50:50 mixture of 2% plain lidocaine and 0.5% plain marcaine in a normal hallux block fashion and a betadine prep performed.  5. Partial permanent nail avulsion with chemical matrixectomy performed using [id number] applications of phenol followed by alcohol flush.  6. Light dressing applied. 7. Injection of 0.5 mLs Celestone Soluspan injected into the right ankle.  8. Patient does not want oral medication.  9. Prescription for Gentamicin cream provided to patient to use daily with a bandage.  10. Return to clinic in 2 weeks.  Edrick Kins, DPM [facility]  Dr. Edrick Kins, [address]                                        [place],  [address]                Office [phone]  Fax [phone]

## 2019-12-28 DIAGNOSIS — H2513 Age-related nuclear cataract, bilateral: Secondary | ICD-10-CM | POA: Diagnosis not present

## 2019-12-28 DIAGNOSIS — H5203 Hypermetropia, bilateral: Secondary | ICD-10-CM | POA: Diagnosis not present

## 2019-12-28 DIAGNOSIS — H18452 Nodular corneal degeneration, left eye: Secondary | ICD-10-CM | POA: Diagnosis not present

## 2020-01-04 ENCOUNTER — Encounter: Payer: Self-pay | Admitting: Podiatry

## 2020-01-04 ENCOUNTER — Ambulatory Visit
Admission: RE | Admit: 2020-01-04 | Discharge: 2020-01-04 | Disposition: A | Discharge status: Home / Self Care | Payer: Medicare Other | Source: Ambulatory Visit | Attending: Family Medicine | Admitting: Family Medicine

## 2020-01-04 ENCOUNTER — Ambulatory Visit: Payer: Medicare Other

## 2020-01-04 ENCOUNTER — Ambulatory Visit (INDEPENDENT_AMBULATORY_CARE_PROVIDER_SITE_OTHER): Payer: Medicare Other | Admitting: Podiatry

## 2020-01-04 ENCOUNTER — Other Ambulatory Visit: Payer: Self-pay

## 2020-01-04 DIAGNOSIS — N644 Mastodynia: Secondary | ICD-10-CM

## 2020-01-04 DIAGNOSIS — M659 Synovitis and tenosynovitis, unspecified: Secondary | ICD-10-CM

## 2020-01-04 DIAGNOSIS — L600 Ingrowing nail: Secondary | ICD-10-CM

## 2020-01-04 DIAGNOSIS — R928 Other abnormal and inconclusive findings on diagnostic imaging of breast: Secondary | ICD-10-CM | POA: Diagnosis not present

## 2020-01-04 DIAGNOSIS — T148XXA Other injury of unspecified body region, initial encounter: Secondary | ICD-10-CM

## 2020-01-04 DIAGNOSIS — R234 Changes in skin texture: Secondary | ICD-10-CM

## 2020-01-08 NOTE — Progress Notes (Signed)
   Subjective: 74 y.o. female presents today status post permanent nail avulsion procedure of the medial border of the right hallux that was performed on 12/21/2019. She reports continued soreness of the toe that is caused by touching or applying pressure to the area. She has been soaking the toe and applying Gentamicin cream as directed.  She also is here for follow up evaluation of right ankle pain. She states the pain has gotten significantly better after getting the injection at her last visit. There are no worsening factors noted. Patient is here for further evaluation and treatment.   Past Medical History:  Diagnosis Date  . Anemia   . Anxiety and depression    during loss of husband  . Arthritis   . Diverticulosis   . GERD (gastroesophageal reflux disease)   . GI bleed    hx of; hgb as low as 5.9  . Hemorrhoids   . Hiatal hernia   . Obesity   . Pneumonia   . Urinary tract bacterial infections     Objective: Skin is warm, dry and supple. Nail and respective nail fold appears to be healing appropriately. Open wound to the associated nail fold with a granular wound base and moderate amount of fibrotic tissue. Minimal drainage noted. Mild erythema around the periungual region likely due to phenol chemical matricectomy. Medial border of the left great toe appears to be erythematous with evidence of an ingrowing nail. Pain on palpation noted to the border of the nail fold. The remaining nails appear unremarkable at this time.   Assessment: #1 postop permanent partial nail avulsion medial border right great toe #2 open wound periungual nail fold of respective digit.  #3 Paronychia with ingrowing nail medial border left great toe #4 Pain in toe #5 Incurvated nail #6 ankle synovitis right - resolved   Plan of care: #1 patient was evaluated  #2 debridement of open wound was performed to the periungual border of the respective toe using a currette. Antibiotic ointment and Band-Aid was  applied. #3 Recommended daily Epsom salt soaks.  #4 Continue using Gentamicin cream.  #5 patient is to return to clinic in two weeks. If left great toenail is not better, we will do a partial permanent nail avulsion procedure.   Edrick Kins, DPM [facility]  Dr. Edrick Kins, [address]                                        [place], [place] [address]                Office [phone]  Fax [phone]

## 2020-01-25 ENCOUNTER — Encounter: Payer: Self-pay | Admitting: Podiatry

## 2020-01-25 ENCOUNTER — Other Ambulatory Visit: Payer: Self-pay

## 2020-01-25 ENCOUNTER — Ambulatory Visit (INDEPENDENT_AMBULATORY_CARE_PROVIDER_SITE_OTHER): Payer: Medicare Other | Admitting: Podiatry

## 2020-01-25 DIAGNOSIS — L600 Ingrowing nail: Secondary | ICD-10-CM | POA: Diagnosis not present

## 2020-01-25 MED ORDER — DOXYCYCLINE HYCLATE 100 MG PO TABS
100.0000 mg | ORAL_TABLET | Freq: Two times a day (BID) | ORAL | 0 refills | Status: DC
Start: 2020-01-25 — End: 2020-06-04

## 2020-01-25 NOTE — Progress Notes (Signed)
   HPI: 74 y.o. female presenting today for follow-up evaluation regarding partial permanent nail avulsion of an ingrown that was performed to the right hallux on 12/21/2019.  She continues to have a little bit of soreness to the medial aspect of the right hallux.  She also states that pressure on the distal tip of the nail plate causes a retrograde pressure.  Patient has the same sensation of the nail plate on the left hallux as well.  She presents today for further treatment and evaluation  Past Medical History:  Diagnosis Date  . Anemia   . Anxiety and depression    during loss of husband  . Arthritis   . Diverticulosis   . GERD (gastroesophageal reflux disease)   . GI bleed    hx of; hgb as low as 5.9  . Hemorrhoids   . Hiatal hernia   . Obesity   . Pneumonia   . Urinary tract bacterial infections      Physical Exam: General: The patient is alert and oriented x3 in no acute distress.  Dermatology: Skin is warm, dry and supple bilateral lower extremities. Negative for open lesions or macerations.  The partial permanent nail avulsion site to the medial border of the right hallux appears to be healing appropriately.  There is some mild erythema localized around the nail avulsion site.  Negative for any significant drainage.  There is no bleeding.  No proximal streaking.  Elongated nails noted to the bilateral great toes.  Vascular: Palpable pedal pulses bilaterally. No edema or erythema noted. Capillary refill within normal limits.  Neurological: Epicritic and protective threshold grossly intact bilaterally.   Musculoskeletal Exam: Range of motion within normal limits to all pedal and ankle joints bilateral. Muscle strength 5/5 in all groups bilateral.  Tenderness to palpation to the distal tip of the nail plates bilateral great toes as well as a little bit of tenderness along the medial aspect of the right hallux where the nail avulsion site was performed.  Assessment: 1.  S/P partial  permanent nail avulsion medial border right hallux.  12/21/2019 2.  Elongated nails bilateral great toes 3.  Possible mild cellulitis around the nail avulsion site right hallux   Plan of Care:  1. Patient evaluated.  2.  Light debridement of the nail avulsion site was performed using tissue nipper.  Again there was no identifiable drainage or bleeding. 3.  Mechanical debridement of the bilateral great toenails was performed.  Immediately the patient states that the toenails felt much better. 4.  Continue antibiotic gentamicin cream and a Band-Aid daily to the right hallux 5.  Prescription for doxycycline 100 mg #14 6.  Return to clinic in 2 weeks      Edrick Kins, DPM [facility]  Dr. Edrick Kins, DPM    [address], [place] [address]                Office [phone]  Fax [phone]

## 2020-01-27 ENCOUNTER — Ambulatory Visit: Payer: Medicare Other

## 2020-01-27 ENCOUNTER — Ambulatory Visit (INDEPENDENT_AMBULATORY_CARE_PROVIDER_SITE_OTHER): Payer: Medicare Other | Admitting: Family Medicine

## 2020-01-27 ENCOUNTER — Other Ambulatory Visit: Payer: Self-pay

## 2020-01-27 ENCOUNTER — Encounter: Payer: Self-pay | Admitting: Family Medicine

## 2020-01-27 VITALS — BP 142/86 | HR 62 | Temp 98.1°F | Ht 61.0 in | Wt 221.0 lb

## 2020-01-27 DIAGNOSIS — I10 Essential (primary) hypertension: Secondary | ICD-10-CM | POA: Diagnosis not present

## 2020-01-27 DIAGNOSIS — R5383 Other fatigue: Secondary | ICD-10-CM | POA: Diagnosis not present

## 2020-01-27 DIAGNOSIS — Z0000 Encounter for general adult medical examination without abnormal findings: Secondary | ICD-10-CM

## 2020-01-27 DIAGNOSIS — E785 Hyperlipidemia, unspecified: Secondary | ICD-10-CM | POA: Diagnosis not present

## 2020-01-27 DIAGNOSIS — D692 Other nonthrombocytopenic purpura: Secondary | ICD-10-CM | POA: Diagnosis not present

## 2020-01-27 DIAGNOSIS — F321 Major depressive disorder, single episode, moderate: Secondary | ICD-10-CM | POA: Diagnosis not present

## 2020-01-27 DIAGNOSIS — R739 Hyperglycemia, unspecified: Secondary | ICD-10-CM | POA: Insufficient documentation

## 2020-01-27 NOTE — Progress Notes (Signed)
Phone [phone] In person visit   Subjective:   Shelly Johnson is a 74 y.o. year old very pleasant female patient who presents for/with See problem oriented charting  This visit occurred during the SARS-CoV-2 public health emergency.  Safety protocols were in place, including screening questions prior to the visit, additional usage of staff PPE, and extensive cleaning of exam room while observing appropriate contact time as indicated for disinfecting solutions.   Past Medical History-  Patient Active Problem List   Diagnosis Date Noted   Hyperlipidemia, unspecified 09/14/2019    Priority: Medium   Numbness and tingling of both feet 01/21/2018    Priority: Medium   Depression, major, single episode, moderate ([place]) 06/20/2017    Priority: Medium   Essential hypertension 08/23/2016    Priority: Medium   ANEMIA-NOS 03/16/2008    Priority: Medium   Morbid obesity ([place]) 04/28/2018    Priority: Low   Hiatal hernia with gastroesophageal reflux 03/08/2008    Priority: Low   DDD (degenerative disc disease), lumbar 11/16/2018   Dyspnea on exertion 08/23/2016    Medications- reviewed and updated Current Outpatient Medications  Medication Sig Dispense Refill   chlorthalidone (HYGROTON) 25 MG tablet TAKE ONE TABLET BY MOUTH DAILY 90 tablet 2   doxycycline (VIBRA-TABS) 100 MG tablet Take 1 tablet (100 mg total) by mouth 2 (two) times daily. 14 tablet 0   ferrous sulfate 325 (65 FE) MG tablet Take 1 tablet (325 mg total) by mouth 2 (two) times daily. 60 tablet 3   gentamicin cream (GARAMYCIN) 0.1 % Apply 1 application topically 2 (two) times daily. 15 g 1   omeprazole (PRILOSEC) 40 MG capsule TAKE ONE CAPSULE BY MOUTH DAILY 90 capsule 2   vitamin C (ASCORBIC ACID) 500 MG tablet Take 500 mg by mouth daily.       No current facility-administered medications for this visit.     Objective:  BP (!) 142/86    Pulse 62    Temp 98.1 F (36.7 C) (Temporal)    Ht _0   (1.549 m)    Wt 221 lb (100.2 kg)    SpO2 96%    BMI 41.76 kg/m  Gen: NAD, resting comfortably CV: RRR no murmurs rubs or gallops Lungs: CTAB no crackles, wheeze, rhonchi Abdomen: soft/nontender/nondistended/normal bowel sounds. No rebound or guarding.  Ext: trace edema Skin: warm, dry    Assessment and Plan   #hypertension S: medication: Chlorthalidone 25 mg Home readings #s: 128/78 thi smorning BP Readings from Last 3 Encounters:  01/27/20 (!) 142/86  09/14/19 131/80  11/16/18 129/78  A/P:  good control at home this morning-very mildly elevated in office-we opted to continue current medication with chlorthalidone 25 mg and if running high at home she will let me know  # Elevated LFTs S:Mild ALT elevation at last visit-strongly suspect related to fatty liver Hepatic Function Latest Ref Rng & Units 09/16/2019 01/21/2018 06/19/2017  Total Protein 6.0 - 8.3 g/dL 6.8 7.1 6.7  Albumin 3.5 - 5.2 g/dL 4.2 4.2 4.1  AST 0 - 37 U/L _1 ALT 0 - 35 U/L 39(H) 32 31  Alk Phosphatase 39 - 117 U/L 56 46 48  Total Bilirubin 0.2 - 1.2 mg/dL 0.5 0.4 0.5  Bilirubin, Direct 0.0 - 0.3 mg/dL - - -   A/P: slightly high ALT- likely fatty liver- recommended weight loss   # abdominal pain/ha/fatigue in january S:worsening fatigue since that time- still active but gets tired faster. Is having  shortness of breath but no chest pain  A/P: she does agree to at least do bloodwork but does not want to do EKG. Also will check for covid antibodies- could have been covid as did not get tested at that time.  - has had SOB for sometime- x-ray in 2019 and echocardiogram in 2018 largely reassuring  #hyperlipidemia S: Medication:Recommended rosuvastatin 10 mg daily at last visit due to elevated 10-year ASCVD risk of 14.7% Lab Results  Component Value Date   CHOL 199 09/16/2019   HDL 53.00 09/16/2019   LDLCALC 106 (H) 07/18/2013   LDLDIRECT 113.0 09/16/2019   TRIG 319.0 (H) 09/16/2019   CHOLHDL 4 09/16/2019    A/P: Patient to her regimenAt the moment does not want to add any-she would like to work with healthy weight wellness program and if numbers not improving by next year reconsider rosuvastatin option at 10 mg -she is also considering a trainer   # GERD S: Medication: Omeprazole 40 mg-history of profound GI bleed-on B12 and iron. B12 levels related to PPI use: Lab Results  Component Value Date   [id number] 09/16/2019  A/P: stable - with prior GI bleed we opted to continue current dose.  -needs to be cautious about her aleve use. Tylenol or tylenol arthritis recommended    # Hyperglycemia/insulin resistance/prediabetes S:  Medication: None Exercise and diet- see below Lab Results  Component Value Date   HGBA1C 6.1 09/16/2019   HGBA1C (H) 03/09/2008    6.2 (NOTE)   The ADA recommends the following therapeutic goal for glycemic   control related to Hgb A1C measurement:   Goal of Therapy:   < 7.0% Hgb A1C   Reference: American Diabetes Association: Clinical Practice   Recommendations 2008, Diabetes Care,  2008, 31:(Suppl 1).   A/P: Patient does have prediabetes-we will update an A1c with blood work-he is going to go to the [place] location.  I think the healthy weight wellness program may help prevent diabetes if we can make some positive lifestyle changes.   #Morbid obesity  S: Weight up 3 pounds from January visit. Not exercising. A lot of dietary indescretions- not cooking much- eating quick and easy Wt Readings from Last 3 Encounters:  01/27/20 221 lb (100.2 kg)  09/14/19 218 lb (98.9 kg)  11/16/18 215 lb (97.5 kg)  A/P: Patient is doing her best to eat healthy but feels like she has had a lot of setbacks-I think encouragement from the healthy weight wellness program would go a long way-we placed a referral today    Recommended follow up: 6 months or sooner if needed  Lab/Order associations:   ICD-10-CM   1. Preventative health care  Z00.00   2. Hyperlipidemia, unspecified  hyperlipidemia type  E78.5   3. Essential hypertension  I10   4. Depression, major, single episode, moderate (HCC)  F32.1   5. Morbid obesity ([place]) Chronic E66.01   6. Hyperglycemia  R73.9     No orders of the defined types were placed in this encounter.  Return precautions advised.  Garret Reddish, MD

## 2020-01-27 NOTE — Progress Notes (Signed)
Phone [phone]    Subjective:  Patient presents today for their annual wellness visit (subsequent)  Preventive Screening-Counseling & Management  Modifiable Risk Factors/behavioral risk assessment/psychosocial risk assessment Regular exercise: no- but considering a trainer Diet: Feels like she is struggling-we discussed referral to healthy weight wellness program. Encouraged weight watchers Wt Readings from Last 3 Encounters:  01/27/20 221 lb (100.2 kg)  09/14/19 218 lb (98.9 kg)  11/16/18 215 lb (97.5 kg)   Smoking Status: Never Smoker Second Hand Smoking status: No smokers in home Alcohol intake: 1 glass of wine every 3-4 months  Cardiac risk factors:  advanced age (older than 32 for men, 43 for women)  untreated Hyperlipidemia  treated Hypertension -slightly high today but good at home No diabetes. Does have prediabets Lab Results  Component Value Date   HGBA1C 6.1 09/16/2019  Family History: no history of heart disease or stroke   Depression Screen/risk evaluation Risk factors: loss of husband.Marland Kitchen PHQ2 0 - phq9 of 6 - has some annoyance with what is going on in the world.  Depression screen [facility] 2/9 01/27/2020 09/14/2019 11/16/2018 03/04/2018 06/19/2017  Decreased Interest 0 1 0 1 1  Down, Depressed, Hopeless 0 0 0 0 1  PHQ - 2 Score 0 1 0 1 2  Altered sleeping 0 0 1 - 0  Tired, decreased energy 3 1 1  - 3  Change in appetite 0 1 0 - 2  Feeling bad or failure about yourself  0 0 0 - 1  Trouble concentrating 3 0 0 - 3  Moving slowly or fidgety/restless 0 0 0 - 0  Suicidal thoughts 0 0 0 - 0  PHQ-9 Score 6 3 2  - 11  Difficult doing work/chores Not difficult at all Not difficult at all Not difficult at all - Somewhat difficult   Functional ability and level of safety Mobility assessment:  timed get up and go <12 seconds Activities of Daily Living- Independent in ADLs (toileting, bathing, dressing, transferring, eating) and in IADLs (shopping, housekeeping, managing own  medications, and handling finances) Home Safety: Loose rugs (no), smoke detectors (up to date), small pets (no), grab bars (no- but feels safe), stairs (flight upstairs but no issues), life-alert system (cell phone if needed) Hearing Difficulties: -patient declines Fall Risk: None  Fall Risk  01/27/2020 07/13/2019 03/04/2018 02/05/2017 03/12/2016  Falls in the past year? 0 0 No No No  Comment - Emmi Telephone Survey: data to providers prior to load - - -  Number falls in past yr: 0 - - - -  Injury with Fall? 0 - - - -  Opioid use history:  no long term opioids use Self assessment of health status: "good"  Cognitive Testing             No reported trouble.   Mini cog: normal clock draw. 2/3 delayed recall. Normal test result   List the Names of Other Physician/Practitioners you currently use: Patient Care Team: [name], MD as PCP - General (Family Medicine) Sherren Mocha, MD as Consulting Physician (Cardiology) Paralee Cancel, MD as Consulting Physician (Orthopedic Surgery) Edrick Kins, DPM as Consulting Physician (Podiatry) Dr. Amalia Hailey podiatry  Required Immunizations needed today:  Strongly encouraged covid vaccine Immunization History  Administered Date(s) Administered  . Influenza, High Dose Seasonal PF 07/04/2016, 06/19/2017, 04/28/2018  . Influenza,inj,Quad PF,6+ Mos 07/18/2013, 04/25/2014  . Pneumococcal Conjugate-13 07/18/2013, 04/03/2015  . Pneumococcal Polysaccharide-23 07/04/2016  . Tdap 07/21/2013   Health Maintenance  Topic Date Due  .  COVID-19 Vaccine (1) Never done  . Flu Shot  03/18/2020  . Cologuard (Stool DNA test)  12/20/2021  . Mammogram  01/03/2022  . Tetanus Vaccine  07/22/2023  . DEXA scan (bone density measurement)  Completed  .  Hepatitis C: One time screening is recommended by Center for Disease Control  (CDC) for  adults born from [birth year] through 1965.   Completed  . Pneumonia vaccines  Completed    Screening tests-  1. Colon cancer  screening- cologuard 12/21/2018 2. Lung Cancer screening- not a candidate 3. Skin cancer screening- no dermatologist. Denies worrisome/changing skin lesions.  4. Cervical cancer screening- never had abnormal pap smear- passed age based screening recommendatoins 5. Breast cancer screening- 01/04/20 mammogram updated  The following were reviewed and entered/updated in epic if appropriate: Past Medical History:  Diagnosis Date  . Anemia   . Anxiety and depression    during loss of husband  . Arthritis   . Diverticulosis   . GERD (gastroesophageal reflux disease)   . GI bleed    hx of; hgb as low as 5.9  . Hemorrhoids   . Hiatal hernia   . Obesity   . Pneumonia   . Urinary tract bacterial infections    Patient Active Problem List   Diagnosis Date Noted  . Hyperglycemia 01/27/2020    Priority: Medium  . Hyperlipidemia, unspecified 09/14/2019    Priority: Medium  . Numbness and tingling of both feet 01/21/2018    Priority: Medium  . Depression, major, single episode, moderate ([place]) 06/20/2017    Priority: Medium  . Essential hypertension 08/23/2016    Priority: Medium  . ANEMIA-NOS 03/16/2008    Priority: Medium  . Morbid obesity ([place]) 04/28/2018    Priority: Low  . Hiatal hernia with gastroesophageal reflux 03/08/2008    Priority: Low  . Senile purpura ([place]) 01/27/2020  . DDD (degenerative disc disease), lumbar 11/16/2018  . Dyspnea on exertion 08/23/2016   Past Surgical History:  Procedure Laterality Date  . ABDOMINAL HYSTERECTOMY     states had a "few cancer cells" but they "got everything". ? ovaries and cervix.   . APPENDECTOMY    . HIATAL HERNIA REPAIR      Family History  Problem Relation Age of Onset  . Arthritis Mother   . Uterine cancer Mother        uterine  . Diabetes Brother   . Hypertension Brother   . Breast cancer Maternal Grandmother   . Colon cancer Maternal Aunt 96  . Colon polyps Neg Hx   . Esophageal cancer Neg Hx   . Stomach cancer Neg Hx       Medications- reviewed and updated Current Outpatient Medications  Medication Sig Dispense Refill  . chlorthalidone (HYGROTON) 25 MG tablet TAKE ONE TABLET BY MOUTH DAILY 90 tablet 2  . doxycycline (VIBRA-TABS) 100 MG tablet Take 1 tablet (100 mg total) by mouth 2 (two) times daily. 14 tablet 0  . ferrous sulfate 325 (65 FE) MG tablet Take 1 tablet (325 mg total) by mouth 2 (two) times daily. 60 tablet 3  . gentamicin cream (GARAMYCIN) 0.1 % Apply 1 application topically 2 (two) times daily. 15 g 1  . omeprazole (PRILOSEC) 40 MG capsule TAKE ONE CAPSULE BY MOUTH DAILY 90 capsule 2  . vitamin C (ASCORBIC ACID) 500 MG tablet Take 500 mg by mouth daily.       No current facility-administered medications for this visit.    Allergies-reviewed and updated Allergies  Allergen  Reactions  . Nsaids     Should avoid-- GI bleed  . Hydrocodone Nausea And Vomiting    Social History   Socioeconomic History  . Marital status: Widowed    Spouse name: Not on file  . Number of children: 2  . Years of education: Not on file  . Highest education level: Not on file  Occupational History  . Occupation: Retired  Tobacco Use  . Smoking status: Never Smoker  . Smokeless tobacco: Never Used  Vaping Use  . Vaping Use: Never used  Substance and Sexual Activity  . Alcohol use: Yes    Comment: 1 per month  . Drug use: No  . Sexual activity: Not on file  Other Topics Concern  . Not on file  Social History Narrative   Widowed 03/02/2007. 2 sons. 4 grandkids (2 girls/2 boys).       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, Occupational hygienist, yardwork         Social Determinants of Health   Financial Resource Strain:   . Difficulty of Paying Living Expenses:   Food Insecurity:   . Worried About Charity fundraiser in the Last Year:   . Arboriculturist in the Last Year:   Transportation Needs:   . Film/video editor (Medical):   Marland Kitchen Lack of  Transportation (Non-Medical):   Physical Activity:   . Days of Exercise per Week:   . Minutes of Exercise per Session:   Stress:   . Feeling of Stress :   Social Connections:   . Frequency of Communication with Friends and Family:   . Frequency of Social Gatherings with Friends and Family:   . Attends Religious Services:   . Active Member of Clubs or Organizations:   . Attends Archivist Meetings:   Marland Kitchen Marital Status:       Objective:  BP (!) 142/86   Pulse 62   Temp 98.1 F (36.7 C) (Temporal)   Ht 5\' 1"  (1.549 m)   Wt 221 lb (100.2 kg)   SpO2 96%   BMI 41.76 kg/m  Gen: NAD, resting comfortably HEENT: Mucous membranes are moist. Oropharynx normal   Assessment/Plan:  AWV completed 1. Educated, counseled and referred based on above elements 2. Educated, counseled and referred as appropriate for preventative needs 3. Discussed and documented a written plan for preventiative services and screenings with personalized health advice- After Visit Summary was given to patient which included this plan   Status of chronic or acute concerns  See problem oriented charting  Recommended follow up: 1 year awv   Lab/Order associations:   ICD-10-CM   1. Preventative health care  Z00.00     Return precautions advised.  Garret Reddish, MD

## 2020-01-27 NOTE — Patient Instructions (Addendum)
Health Maintenance Due  Topic Date Due   COVID-19 Vaccine (1)  Has not had  Never done   Be Nice to our patient   Fatigue: we are ordering lab work today. We will have you go to [place] office for that. Once we have the results we will give you a call once we have those results.   Weight loss: We have started referral for healthy weight and wellness. You should get a call from them to schedule that appointment. This will be a good resource to help with your blood sugar as well.   Blood pressure: was elevated in office today. Has been good at home. As long as you continue to get readings that are lower at home we do not want to change medications at this time.   Cholesterol: You do not want to start medications. We will work with healthy weight and wellness to help you make life style changes that may bring those numbers down.   Try to take tylenol arthritis for your pain or Voltaren if it is a joint. The aleve can be harmful for your history of GI bleed.     Please go to [facility] - located [address] across the street from [place] - in the basement - Hours: 8:30-5:00 PM M-F (with lunch from 12:30- 1 PM). You do NOT need an appointment.   Recommended follow up: No follow-ups on file.   Ms. Meenan , Thank you for taking time to come for your Medicare Wellness Visit. I appreciate your ongoing commitment to your health goals. Please review the following plan we discussed and let me know if I can assist you in the future.   These are the goals we discussed: Goals     patient     Will explore the new exercise routine Try new things.  Meditations       Weight (lb) < 200 lb (90.7 kg)     Lose weight by increasing activity and eating healthier.        This is a list of the screening recommended for you and due dates:  Health Maintenance  Topic Date Due   COVID-19 Vaccine (1) Never done   Flu Shot  03/18/2020   Cologuard (Stool DNA test)  12/20/2021    Mammogram  01/03/2022   Tetanus Vaccine  07/22/2023   DEXA scan (bone density measurement)  Completed    Hepatitis C: One time screening is recommended by Center for Disease Control  (CDC) for  adults born from [birth year] through 1963.   Completed   Pneumonia vaccines  Completed      DASH Eating Plan DASH stands for "Dietary Approaches to Stop Hypertension." The DASH eating plan is a healthy eating plan that has been shown to reduce high blood pressure (hypertension). It may also reduce your risk for type 2 diabetes, heart disease, and stroke. The DASH eating plan may also help with weight loss. What are tips for following this plan?  General guidelines  Avoid eating more than 2,300 mg (milligrams) of salt (sodium) a day. If you have hypertension, you may need to reduce your sodium intake to 1,500 mg a day.  Limit alcohol intake to no more than 1 drink a day for nonpregnant women and 2 drinks a day for men. One drink equals 12 oz of beer, 5 oz of wine, or 1 oz of hard liquor.  Work with your health care provider to maintain a healthy body  weight or to lose weight. Ask what an ideal weight is for you.  Get at least 30 minutes of exercise that causes your heart to beat faster (aerobic exercise) most days of the week. Activities may include walking, swimming, or biking.  Work with your health care provider or diet and nutrition specialist (dietitian) to adjust your eating plan to your individual calorie needs. Reading food labels   Check food labels for the amount of sodium per serving. Choose foods with less than 5 percent of the Daily Value of sodium. Generally, foods with less than 300 mg of sodium per serving fit into this eating plan.  To find whole grains, look for the word "whole" as the first word in the ingredient list. Shopping  Buy products labeled as "low-sodium" or "no salt added."  Buy fresh foods. Avoid canned foods and premade or frozen meals. Cooking  Avoid adding  salt when cooking. Use salt-free seasonings or herbs instead of table salt or sea salt. Check with your health care provider or pharmacist before using salt substitutes.  Do not fry foods. Cook foods using healthy methods such as baking, boiling, grilling, and broiling instead.  Cook with heart-healthy oils, such as olive, canola, soybean, or sunflower oil. Meal planning  Eat a balanced diet that includes: ? 5 or more servings of fruits and vegetables each day. At each meal, try to fill half of your plate with fruits and vegetables. ? Up to 6-8 servings of whole grains each day. ? Less than 6 oz of lean meat, poultry, or fish each day. A 3-oz serving of meat is about the same size as a deck of cards. One egg equals 1 oz. ? 2 servings of low-fat dairy each day. ? A serving of nuts, seeds, or beans 5 times each week. ? Heart-healthy fats. Healthy fats called Omega-3 fatty acids are found in foods such as flaxseeds and coldwater fish, like sardines, salmon, and mackerel.  Limit how much you eat of the following: ? Canned or prepackaged foods. ? Food that is high in trans fat, such as fried foods. ? Food that is high in saturated fat, such as fatty meat. ? Sweets, desserts, sugary drinks, and other foods with added sugar. ? Full-fat dairy products.  Do not salt foods before eating.  Try to eat at least 2 vegetarian meals each week.  Eat more home-cooked food and less restaurant, buffet, and fast food.  When eating at a restaurant, ask that your food be prepared with less salt or no salt, if possible. What foods are recommended? The items listed may not be a complete list. Talk with your dietitian about what dietary choices are best for you. Grains Whole-grain or whole-wheat bread. Whole-grain or whole-wheat pasta. Brown rice. [place]. Bulgur. Whole-grain and low-sodium cereals. Pita bread. Low-fat, low-sodium crackers. Whole-wheat flour tortillas. Vegetables Fresh or frozen  vegetables (raw, steamed, roasted, or grilled). Low-sodium or reduced-sodium tomato and vegetable juice. Low-sodium or reduced-sodium tomato sauce and tomato paste. Low-sodium or reduced-sodium canned vegetables. Fruits All fresh, dried, or frozen fruit. Canned fruit in natural juice (without added sugar). Meat and other protein foods Skinless chicken or [place]. Ground chicken or [place]. Pork with fat trimmed off. Fish and seafood. Egg whites. Dried beans, peas, or lentils. Unsalted nuts, nut butters, and seeds. Unsalted canned beans. Lean cuts of beef with fat trimmed off. Low-sodium, lean deli meat. Dairy Low-fat (1%) or fat-free (skim) milk. Fat-free, low-fat, or reduced-fat cheeses. Nonfat, low-sodium ricotta or cottage  cheese. Low-fat or nonfat yogurt. Low-fat, low-sodium cheese. Fats and oils Soft margarine without trans fats. Vegetable oil. Low-fat, reduced-fat, or light mayonnaise and salad dressings (reduced-sodium). Canola, safflower, olive, soybean, and sunflower oils. Avocado. Seasoning and other foods Herbs. Spices. Seasoning mixes without salt. Unsalted popcorn and pretzels. Fat-free sweets. What foods are not recommended? The items listed may not be a complete list. Talk with your dietitian about what dietary choices are best for you. Grains Baked goods made with fat, such as croissants, muffins, or some breads. Dry pasta or rice meal packs. Vegetables Creamed or fried vegetables. Vegetables in a cheese sauce. Regular canned vegetables (not low-sodium or reduced-sodium). Regular canned tomato sauce and paste (not low-sodium or reduced-sodium). Regular tomato and vegetable juice (not low-sodium or reduced-sodium). Angie Fava. Olives. Fruits Canned fruit in a light or heavy syrup. Fried fruit. Fruit in cream or butter sauce. Meat and other protein foods Fatty cuts of meat. Ribs. Fried meat. Berniece Salines. Sausage. Bologna and other processed lunch meats. Salami. Fatback. Hotdogs. Bratwurst.  Salted nuts and seeds. Canned beans with added salt. Canned or smoked fish. Whole eggs or egg yolks. Chicken or [place] with skin. Dairy Whole or 2% milk, cream, and half-and-half. Whole or full-fat cream cheese. Whole-fat or sweetened yogurt. Full-fat cheese. Nondairy creamers. Whipped toppings. Processed cheese and cheese spreads. Fats and oils Butter. Stick margarine. Lard. Shortening. Ghee. Bacon fat. Tropical oils, such as coconut, palm kernel, or palm oil. Seasoning and other foods Salted popcorn and pretzels. Onion salt, garlic salt, seasoned salt, table salt, and sea salt. Worcestershire sauce. Tartar sauce. Barbecue sauce. Teriyaki sauce. Soy sauce, including reduced-sodium. Steak sauce. Canned and packaged gravies. Fish sauce. Oyster sauce. Cocktail sauce. Horseradish that you find on the shelf. Ketchup. Mustard. Meat flavorings and tenderizers. Bouillon cubes. Hot sauce and Tabasco sauce. Premade or packaged marinades. Premade or packaged taco seasonings. Relishes. Regular salad dressings. Where to find more information:  National Heart, Lung, and [place]: https://wilson-eaton.com/  American Heart Association: www.heart.org Summary  The DASH eating plan is a healthy eating plan that has been shown to reduce high blood pressure (hypertension). It may also reduce your risk for type 2 diabetes, heart disease, and stroke.  With the DASH eating plan, you should limit salt (sodium) intake to 2,300 mg a day. If you have hypertension, you may need to reduce your sodium intake to 1,500 mg a day.  When on the DASH eating plan, aim to eat more fresh fruits and vegetables, whole grains, lean proteins, low-fat dairy, and heart-healthy fats.  Work with your health care provider or diet and nutrition specialist (dietitian) to adjust your eating plan to your individual calorie needs. This information is not intended to replace advice given to you by your health care provider. Make sure you discuss any  questions you have with your health care provider. Document Revised: 07/17/2017 Document Reviewed: 07/28/2016 Elsevier Patient Education  2020 [employer].

## 2020-01-27 NOTE — Assessment & Plan Note (Signed)
Easy bruising reported- particularly at elbows- will monitor

## 2020-01-31 ENCOUNTER — Other Ambulatory Visit: Payer: Self-pay | Admitting: Family Medicine

## 2020-01-31 ENCOUNTER — Other Ambulatory Visit: Payer: Medicare Other

## 2020-01-31 DIAGNOSIS — R739 Hyperglycemia, unspecified: Secondary | ICD-10-CM

## 2020-01-31 DIAGNOSIS — I10 Essential (primary) hypertension: Secondary | ICD-10-CM

## 2020-01-31 DIAGNOSIS — R5383 Other fatigue: Secondary | ICD-10-CM

## 2020-01-31 LAB — CBC WITH DIFFERENTIAL/PLATELET
Basophils Absolute: 0.1 10*3/uL (ref 0.0–0.1)
Basophils Relative: 0.7 % (ref 0.0–3.0)
Eosinophils Absolute: 0.1 10*3/uL (ref 0.0–0.7)
Eosinophils Relative: 1.1 % (ref 0.0–5.0)
HCT: 45 % (ref 36.0–46.0)
Hemoglobin: 15 g/dL (ref 12.0–15.0)
Lymphocytes Relative: 33.3 % (ref 12.0–46.0)
Lymphs Abs: 2.7 10*3/uL (ref 0.7–4.0)
MCHC: 33.4 g/dL (ref 30.0–36.0)
MCV: 92.7 fl (ref 78.0–100.0)
Monocytes Absolute: 0.4 10*3/uL (ref 0.1–1.0)
Monocytes Relative: 5.4 % (ref 3.0–12.0)
Neutro Abs: 4.9 10*3/uL (ref 1.4–7.7)
Neutrophils Relative %: 59.5 % (ref 43.0–77.0)
Platelets: 189 10*3/uL (ref 150.0–400.0)
RBC: 4.86 Mil/uL (ref 3.87–5.11)
RDW: 13.8 % (ref 11.5–15.5)
WBC: 8.2 10*3/uL (ref 4.0–10.5)

## 2020-01-31 LAB — COMPREHENSIVE METABOLIC PANEL
ALT: 25 U/L (ref 0–35)
AST: 17 U/L (ref 0–37)
Albumin: 4.2 g/dL (ref 3.5–5.2)
Alkaline Phosphatase: 52 U/L (ref 39–117)
BUN: 18 mg/dL (ref 6–23)
CO2: 34 mEq/L — ABNORMAL HIGH (ref 19–32)
Calcium: 9.3 mg/dL (ref 8.4–10.5)
Chloride: 97 mEq/L (ref 96–112)
Creatinine, Ser: 0.96 mg/dL (ref 0.40–1.20)
GFR: 57.12 mL/min — ABNORMAL LOW (ref >60.00)
Glucose, Bld: 122 mg/dL — ABNORMAL HIGH (ref 70–99)
Potassium: 3.2 mEq/L — ABNORMAL LOW (ref 3.5–5.1)
Sodium: 139 mEq/L (ref 135–145)
Total Bilirubin: 0.4 mg/dL (ref 0.2–1.2)
Total Protein: 6.5 g/dL (ref 6.0–8.3)

## 2020-01-31 LAB — TSH: TSH: 2.64 u[IU]/mL (ref 0.35–4.50)

## 2020-01-31 LAB — HEMOGLOBIN A1C: Hgb A1c MFr Bld: 6.5 % (ref 4.6–6.5)

## 2020-01-31 LAB — SARS-COV-2 IGG: SARS-COV-2 IgG: 0.03

## 2020-01-31 MED ORDER — POTASSIUM CHLORIDE CRYS ER 20 MEQ PO TBCR
20.0000 meq | EXTENDED_RELEASE_TABLET | Freq: Every day | ORAL | 0 refills | Status: DC | PRN
Start: 2020-01-31 — End: 2020-02-21

## 2020-02-02 ENCOUNTER — Other Ambulatory Visit: Payer: Self-pay

## 2020-02-02 DIAGNOSIS — E876 Hypokalemia: Secondary | ICD-10-CM

## 2020-02-06 ENCOUNTER — Telehealth: Payer: Self-pay | Admitting: Family Medicine

## 2020-02-06 NOTE — Telephone Encounter (Signed)
Patient states she is to have labs this week at [facility].  Patient states she was told to go this week to the lab but that she needed to wait 7 days after completing potasium.  Patient states today is her last pill of potasium.  Patient would like a follow up call as to when she need to go to the [place] lab.

## 2020-02-06 NOTE — Telephone Encounter (Signed)
Spoke to pt told her need to repeat labs this week. Orders are already in. Pt verbalized understanding.

## 2020-02-09 ENCOUNTER — Other Ambulatory Visit (INDEPENDENT_AMBULATORY_CARE_PROVIDER_SITE_OTHER): Payer: Medicare Other

## 2020-02-09 ENCOUNTER — Ambulatory Visit: Payer: Medicare Other | Attending: Internal Medicine

## 2020-02-09 DIAGNOSIS — Z23 Encounter for immunization: Secondary | ICD-10-CM

## 2020-02-09 DIAGNOSIS — E876 Hypokalemia: Secondary | ICD-10-CM | POA: Diagnosis not present

## 2020-02-09 LAB — BASIC METABOLIC PANEL
BUN: 14 mg/dL (ref 6–23)
CO2: 32 mEq/L (ref 19–32)
Calcium: 9.4 mg/dL (ref 8.4–10.5)
Chloride: 99 mEq/L (ref 96–112)
Creatinine, Ser: 0.85 mg/dL (ref 0.40–1.20)
GFR: 65.73 mL/min (ref >60.00)
Glucose, Bld: 102 mg/dL — ABNORMAL HIGH (ref 70–99)
Potassium: 3.4 mEq/L — ABNORMAL LOW (ref 3.5–5.1)
Sodium: 137 mEq/L (ref 135–145)

## 2020-02-09 NOTE — Progress Notes (Signed)
   Covid-19 Vaccination Clinic  Name:  Shelly Johnson    MRN: [id number] DOB: Sep 29, 1945  02/09/2020  [name] was observed post Covid-19 immunization for 15 minutes without incident. She was provided with Vaccine Information Sheet and instruction to access the V-Safe system.   [name] was instructed to call 911 with any severe reactions post vaccine: Marland Kitchen Difficulty breathing  . Swelling of face and throat  . A fast heartbeat  . A bad rash all over body  . Dizziness and weakness   Immunizations Administered    Name Date Dose VIS Date Route   JANSSEN COVID-19 VACCINE 02/09/2020 11:02 AM 0.5 mL 10/15/2019 Intramuscular   Manufacturer: [name]   Lot: [id number]   Steen: 47125-271-29

## 2020-02-19 ENCOUNTER — Other Ambulatory Visit: Payer: Self-pay | Admitting: Family Medicine

## 2020-03-27 ENCOUNTER — Telehealth: Payer: Self-pay | Admitting: Family Medicine

## 2020-03-27 NOTE — Progress Notes (Signed)
  Chronic Care Management   Outreach Note  03/27/2020 Name: Shelly Johnson MRN: [id number] DOB: October 30, 1945  Referred by: [name], MD Reason for referral : No chief complaint on file.   An unsuccessful telephone outreach was attempted today. The patient was referred to the pharmacist for assistance with care management and care coordination.   Follow Up Plan:   Earney [place] Upstream Scheduler

## 2020-03-29 ENCOUNTER — Telehealth: Payer: Self-pay | Admitting: Family Medicine

## 2020-03-29 NOTE — Progress Notes (Signed)
°  Chronic Care Management   Note  03/29/2020 Name: Shelly Johnson MRN: [id number] DOB: [birth year]  Shelly Johnson is a 74 y.o. year old female who is a primary care patient of [name], MD. I reached out to [name] by phone today in response to a referral sent by [name]'s PCP, Yong Channel Brayton Mars, MD.   [name] was given information about Chronic Care Management services today including:  1. CCM service includes personalized support from designated clinical staff supervised by her physician, including individualized plan of care and coordination with other care providers 2. 24/7 contact phone numbers for assistance for urgent and routine care needs. 3. Service will only be billed when office clinical staff spend 20 minutes or more in a month to coordinate care. 4. Only one practitioner may furnish and bill the service in a calendar month. 5. The patient may stop CCM services at any time (effective at the end of the month) by phone call to the office staff.   Patient agreed to services and verbal consent obtained.   Follow up plan:   SIGNATURE

## 2020-05-23 ENCOUNTER — Telehealth: Payer: Self-pay

## 2020-05-23 NOTE — Telephone Encounter (Signed)
Dr. Yong Channel has nothing this week. Dr. Jerline Pain and Aldona Bar have some Friday. Is it okay to schedule with one of them on Friday?

## 2020-05-23 NOTE — Telephone Encounter (Signed)
Needs earlier appointment- asap

## 2020-05-23 NOTE — Telephone Encounter (Signed)
Pts ankle is swollen and white in color. Her toes are red and numb on the bottom. She has an appt on the 18th, but isn't sure if she should come in earlier or what she should do. Please advise

## 2020-05-23 NOTE — Telephone Encounter (Signed)
See below, SDA is fine.

## 2020-05-23 NOTE — Telephone Encounter (Signed)
Pt is scheduled Friday with Jerline Pain

## 2020-05-23 NOTE — Telephone Encounter (Signed)
Yes, that's fine 

## 2020-05-23 NOTE — Telephone Encounter (Signed)
Ok for pt to wait until the 18th?

## 2020-05-25 ENCOUNTER — Ambulatory Visit: Payer: Medicare Other | Admitting: Family Medicine

## 2020-05-31 NOTE — Patient Instructions (Addendum)
Please stop by lab before you go If you have mychart- we will send your results within 3 business days of [place] receiving them.  If you do not have mychart- we will call you about results within 5 business days of [place] receiving them.  *please note we are currently using [facility] which has a longer processing time than [facility] typically so labs may not come back as quickly as in the past *please also note that you will see labs on mychart as soon as they post. I will later go in and write notes on them- will say "notes from Dr. Yong Channel"  Health Maintenance Due  Topic Date Due  . INFLUENZA VACCINE high dose flu shot today  Consider J+J vaccine once clearly approved as booster (looks like its going to be recommended 2 weeks out from last booster) 03/18/2020   We will call you within two weeks about your referral for ultrasound of leg. If you do not hear within 3 weeks, give [place] a call.   If no clot in legs please consider compression stockings  Let me know if you change your mind on sleep apnea testing  Can push out visit in December to 6 months  Out of the [facility]  Please stop by lab before you go If you have mychart- we will send your results within 3 business days of [place] receiving them.  If you do not have mychart- we will call you about results within 5 business days of [place] receiving them.  *please note we are currently using [facility] which has a longer processing time than [facility] typically so labs may not come back as quickly as in the past *please also note that you will see labs on mychart as soon as they post. I will later go in and write notes on them- will say "notes from Dr. Yong Channel"
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
## 2020-06-04 ENCOUNTER — Ambulatory Visit (INDEPENDENT_AMBULATORY_CARE_PROVIDER_SITE_OTHER): Payer: Medicare Other | Admitting: Family Medicine

## 2020-06-04 ENCOUNTER — Other Ambulatory Visit: Payer: Self-pay

## 2020-06-04 ENCOUNTER — Encounter: Payer: Self-pay | Admitting: Family Medicine

## 2020-06-04 VITALS — BP 132/64 | HR 82 | Temp 98.1°F | Resp 18 | Ht 61.0 in | Wt 222.0 lb

## 2020-06-04 DIAGNOSIS — R739 Hyperglycemia, unspecified: Secondary | ICD-10-CM

## 2020-06-04 DIAGNOSIS — Z23 Encounter for immunization: Secondary | ICD-10-CM | POA: Diagnosis not present

## 2020-06-04 DIAGNOSIS — I10 Essential (primary) hypertension: Secondary | ICD-10-CM

## 2020-06-04 DIAGNOSIS — M7989 Other specified soft tissue disorders: Secondary | ICD-10-CM | POA: Diagnosis not present

## 2020-06-05 LAB — COMPLETE METABOLIC PANEL WITH GFR
AG Ratio: 1.8 (calc) (ref 1.0–2.5)
ALT: 33 U/L — ABNORMAL HIGH (ref 6–29)
AST: 20 U/L (ref 10–35)
Albumin: 4.2 g/dL (ref 3.6–5.1)
Alkaline phosphatase (APISO): 52 U/L (ref 37–153)
BUN: 13 mg/dL (ref 7–25)
CO2: 33 mmol/L — ABNORMAL HIGH (ref 20–32)
Calcium: 9.6 mg/dL (ref 8.6–10.4)
Chloride: 98 mmol/L (ref 98–110)
Creat: 0.86 mg/dL (ref 0.60–0.93)
GFR, Est African American: 78 mL/min/{1.73_m2} (ref >=60)
GFR, Est Non African American: 67 mL/min/{1.73_m2} (ref >=60)
Globulin: 2.4 g/dL (calc) (ref 1.9–3.7)
Glucose, Bld: 93 mg/dL (ref 65–99)
Potassium: 3.9 mmol/L (ref 3.5–5.3)
Sodium: 141 mmol/L (ref 135–146)
Total Bilirubin: 0.5 mg/dL (ref 0.2–1.2)
Total Protein: 6.6 g/dL (ref 6.1–8.1)

## 2020-06-05 LAB — HEMOGLOBIN A1C
Hgb A1c MFr Bld: 5.7 % of total Hgb — ABNORMAL HIGH (ref <5.7)
Mean Plasma Glucose: 117 (calc)
eAG (mmol/L): 6.5 (calc)

## 2020-06-07 ENCOUNTER — Telehealth: Payer: Self-pay

## 2020-06-07 NOTE — Progress Notes (Signed)
Chronic Care Management Pharmacy Assistant   Name: Shelly Johnson  MRN: [id number] DOB: 1945/11/19  Reason for Encounter:Initial Visit  Patient Questions:  1.  Have you seen any other providers since your last visit? No  2.  Any changes in your medicines or health? No   Shelly Johnson,  74 y.o. , female presents for their Initial CCM visit with the clinical pharmacist via telephone.  PCP : [name], MD  Allergies:   Allergies  Allergen Reactions  . Nsaids     Should avoid-- GI bleed  . Hydrocodone Nausea And Vomiting    Medications: Outpatient Encounter Medications as of 06/07/2020  Medication Sig  . chlorthalidone (HYGROTON) 25 MG tablet TAKE ONE TABLET BY MOUTH DAILY  . ferrous sulfate 325 (65 FE) MG tablet Take 1 tablet (325 mg total) by mouth 2 (two) times daily.  Marland Kitchen gentamicin cream (GARAMYCIN) 0.1 % Apply 1 application topically 2 (two) times daily.  Marland Kitchen KLOR-CON M20 20 MEQ tablet TAKE ONE TABLET BY MOUTH DAILY AS NEEDED AS INSTRUCTED BY MD  . omeprazole (PRILOSEC) 40 MG capsule TAKE ONE CAPSULE BY MOUTH DAILY  . vitamin C (ASCORBIC ACID) 500 MG tablet Take 500 mg by mouth daily.     No facility-administered encounter medications on file as of 06/07/2020.    Current Diagnosis: Patient Active Problem List   Diagnosis Date Noted  . Hyperglycemia 01/27/2020  . Senile purpura ([place]) 01/27/2020  . Hyperlipidemia, unspecified 09/14/2019  . DDD (degenerative disc disease), lumbar 11/16/2018  . Morbid obesity ([place]) 04/28/2018  . Numbness and tingling of both feet 01/21/2018  . Depression, major, single episode, moderate ([place]) 06/20/2017  . Essential hypertension 08/23/2016  . Dyspnea on exertion 08/23/2016  . ANEMIA-NOS 03/16/2008  . Hiatal hernia with gastroesophageal reflux 03/08/2008     Have you seen any other providers since your last visit?           Patient states she has not seen any other providers.  Any changes in your medications or  health?        Patient states there has not been any changes in medications or health.  Any side effects from any medications?  Patient states she has no side effects from any medications.  Do you have an symptoms or problems not managed by your medications?    Patient states she has no symptoms or concerns not managed by medications .  Any concerns about your health right now?      Patient states she no concerns about her health at this time.  Has your provider asked that you check blood pressure, blood sugar, or follow special diet at home?       Patients states she does not check Blood pressure and does not follow a diet at home.  Do you get any type of exercise on a regular basis?     Patient states she does not exercise on a regular basis however patient states she gets yard work and house work done.   Can you think of a goal you would like to reach for your health?        Patient states she would like to lose weight .   Do you have any problems getting your medications?         Patient states she does have any problems getting medications.  Is there anything that you would like to discuss during the appointment?        Patient  states she would like some encouragement on getting on a better health plan. Patient states she is not into diets, vegetables or salads.   Please bring medications and supplements to appointment  Shelly Johnson ,[facility] Clinical Pharmacist Assistant [phone]     Follow-Up:  Pharmacist Review

## 2020-06-08 ENCOUNTER — Ambulatory Visit (HOSPITAL_COMMUNITY): Payer: Medicare Other

## 2020-06-11 ENCOUNTER — Ambulatory Visit: Payer: Medicare Other

## 2020-06-11 DIAGNOSIS — I10 Essential (primary) hypertension: Secondary | ICD-10-CM

## 2020-06-11 DIAGNOSIS — R739 Hyperglycemia, unspecified: Secondary | ICD-10-CM

## 2020-06-11 DIAGNOSIS — E785 Hyperlipidemia, unspecified: Secondary | ICD-10-CM

## 2020-06-11 NOTE — Patient Instructions (Addendum)
Shelly Johnson,  Thank you for taking the time to review your medications with me as well as talk a bit about diet and exercise. I have included some handouts that might be helpful for some dietary changes.   We had talked about replacing some of the cookies/chocolates/sweets with a healthier snack such as nuts and a focus on white meat like skinless chicken, ground [place] or fish over red meats as initial focus.   We talked about you previously enjoying daily walks for 1-2 miles. Our goal is to start back by walking 2-3x/wk and to not focus on time/distance too much at first.   We will give you a call within the next 1-2 wks to check on your home BP readings to see if they may be running lower at different times. Please keep a log of readings and be sure to test at different times throughout the day.  Please review care plan below and call me at [phone] with any questions!  Thank you, Edyth Gunnels., Clinical Pharmacist  Goals Addressed            This Visit's Progress   . PharmD Care Plan       CARE PLAN ENTRY (see longitudinal plan of care for additional care plan information)  Current Barriers:  . Chronic Disease Management support, education, and care coordination needs related to Hypertension, Hyperlipidemia, and hyperglycemia/prediabetes   Hypertension BP Readings from Last 3 Encounters:  06/04/20 132/64  01/27/20 (!) 142/86  09/14/19 131/80   . Pharmacist Clinical Goal(s): o Over the next 180 days, patient will work with PharmD and providers to achieve BP goal <130/80 . Current regimen:  o Chlorthalidone 25 mg once daily . Interventions: o Diet and exercise recommendations  . Patient self care activities - Over the next 180 days, patient will: o Check BP at least once every 1-2 days, document, and provide at future appointments o Ensure daily salt intake < 2300 mg/day  Hyperlipidemia Lab Results  Component Value Date/Time   LDLCALC 106 (H) 07/18/2013 10:18 AM    LDLDIRECT 113.0 09/16/2019 02:05 PM   . Pharmacist Clinical Goal(s): o Over the next 180 days, patient will work with PharmD and providers to achieve LDL goal < 100 . Current regimen:  o N/a . Interventions: o Diet and exercise recommendations o Discussed statin therapy for CVD prevention . Patient self care activities - Over the next 180 days, patient will: o Continue current management  Hyperglycemia/Prediabetes Lab Results  Component Value Date/Time   HGBA1C 5.7 (H) 06/04/2020 03:38 PM   HGBA1C 6.5 01/31/2020 03:55 PM   . Pharmacist Clinical Goal(s): o Over the next 180 days, patient will work with PharmD and providers to maintain A1c goal <6.5% . Current regimen:  o N/a . Interventions: o Review diet plans . Patient self care activities - Over the next 180 days, patient will: o Check blood sugar only if directed, document, and provide at future appointments o Contact provider with any episodes of hypoglycemia  Medication management . Pharmacist Clinical Goal(s): o Over the next 180 days, patient will work with PharmD and providers to maintain optimal medication adherence . [facility] . Interventions o Comprehensive medication review performed. o Continue current medication management strategy . Patient self care activities - Over the next 180 days, patient will: o Take medications as prescribed o Report any questions or concerns to PharmD and/or provider(s) Initial goal documentation.      The patient verbalized understanding of instructions provided  today and agreed to receive a mailed copy of patient instruction and/or educational materials. Telephone follow up appointment with pharmacy team member scheduled for: See next appointment with "Care Management Staff" under "What's Next" below.   Madelin Rear, Pharm.D., BCGP Clinical Pharmacist [facility] [phone]  Food Choices for Gastroesophageal Reflux Disease, Adult When you  have gastroesophageal reflux disease (GERD), the foods you eat and your eating habits are very important. Choosing the right foods can help ease the discomfort of GERD. Consider working with a diet and nutrition specialist (dietitian) to help you make healthy food choices. What general guidelines should I follow?  Eating plan  Choose healthy foods low in fat, such as fruits, vegetables, whole grains, low-fat dairy products, and lean meat, fish, and poultry.  Eat frequent, small meals instead of three large meals each day. Eat your meals slowly, in a relaxed setting. Avoid bending over or lying down until 2-3 hours after eating.  Limit high-fat foods such as fatty meats or fried foods.  Limit your intake of oils, butter, and shortening to less than 8 teaspoons each day.  Avoid the following: ? Foods that cause symptoms. These may be different for different people. Keep a food diary to keep track of foods that cause symptoms. ? Alcohol. ? Drinking large amounts of liquid with meals. ? Eating meals during the 2-3 hours before bed.  Cook foods using methods other than frying. This may include baking, grilling, or broiling. Lifestyle  Maintain a healthy weight. Ask your health care provider what weight is healthy for you. If you need to lose weight, work with your health care provider to do so safely.  Exercise for at least 30 minutes on 5 or more days each week, or as told by your health care provider.  Avoid wearing clothes that fit tightly around your waist and chest.  Do not use any products that contain nicotine or tobacco, such as cigarettes and e-cigarettes. If you need help quitting, ask your health care provider.  Sleep with the head of your bed raised. Use a wedge under the mattress or blocks under the bed frame to raise the head of the bed. What foods are not recommended? The items listed may not be a complete list. Talk with your dietitian about what dietary choices are best  for you. Grains Pastries or quick breads with added fat. [place] toast. Vegetables Deep fried vegetables. [place] fries. Any vegetables prepared with added fat. Any vegetables that cause symptoms. For some people this may include tomatoes and tomato products, chili peppers, onions and garlic, and horseradish. Fruits Any fruits prepared with added fat. Any fruits that cause symptoms. For some people this may include citrus fruits, such as oranges, grapefruit, pineapple, and lemons. Meats and other protein foods High-fat meats, such as fatty beef or pork, hot dogs, ribs, ham, sausage, salami and bacon. Fried meat or protein, including fried fish and fried chicken. Nuts and nut butters. Dairy Whole milk and chocolate milk. Sour cream. Cream. Ice cream. Cream cheese. Milk shakes. Beverages Coffee and tea, with or without caffeine. Carbonated beverages. Sodas. Energy drinks. Fruit juice made with acidic fruits (such as orange or grapefruit). Tomato juice. Alcoholic drinks. Fats and oils Butter. Margarine. Shortening. Ghee. Sweets and desserts Chocolate and cocoa. Donuts. Seasoning and other foods Pepper. Peppermint and spearmint. Any condiments, herbs, or seasonings that cause symptoms. For some people, this may include curry, hot sauce, or vinegar-based salad dressings. Summary  When you have gastroesophageal reflux  disease (GERD), food and lifestyle choices are very important to help ease the discomfort of GERD.  Eat frequent, small meals instead of three large meals each day. Eat your meals slowly, in a relaxed setting. Avoid bending over or lying down until 2-3 hours after eating.  Limit high-fat foods such as fatty meat or fried foods. This information is not intended to replace advice given to you by your health care provider. Make sure you discuss any questions you have with your health care provider. Document Revised: 11/25/2018 Document Reviewed: 08/05/2016 Elsevier Patient Education   2020 [place].  Prediabetes Eating Plan Prediabetes is a condition that causes blood sugar (glucose) levels to be higher than normal. This increases the risk for developing diabetes. In order to prevent diabetes from developing, your health care provider may recommend a diet and other lifestyle changes to help you:  Control your blood glucose levels.  Improve your cholesterol levels.  Manage your blood pressure. Your health care provider may recommend working with a diet and nutrition specialist (dietitian) to make a meal plan that is best for you. What are tips for following this plan? Lifestyle  Set weight loss goals with the help of your health care team. It is recommended that most people with prediabetes lose 7% of their current body weight.  Exercise for at least 30 minutes at least 5 days a week.  Attend a support group or seek ongoing support from a mental health counselor.  Take over-the-counter and prescription medicines only as told by your health care provider. Reading food labels  Read food labels to check the amount of fat, salt (sodium), and sugar in prepackaged foods. Avoid foods that have: ? Saturated fats. ? Trans fats. ? Added sugars.  Avoid foods that have more than 300 milligrams (mg) of sodium per serving. Limit your daily sodium intake to less than 2,300 mg each day. Shopping  Avoid buying pre-made and processed foods. Cooking  Cook with olive oil. Do not use butter, lard, or ghee.  Bake, broil, grill, or boil foods. Avoid frying. Meal planning   Work with your dietitian to develop an eating plan that is right for you. This may include: ? Tracking how many calories you take in. Use a food diary, notebook, or mobile application to track what you eat at each meal. ? Using the glycemic index (GI) to plan your meals. The index tells you how quickly a food will raise your blood glucose. Choose low-GI foods. These foods take a longer time to raise blood  glucose.  Consider following a Mediterranean diet. This diet includes: ? Several servings each day of fresh fruits and vegetables. ? Eating fish at least twice a week. ? Several servings each day of whole grains, beans, nuts, and seeds. ? Using olive oil instead of other fats. ? Moderate alcohol consumption. ? Eating small amounts of red meat and whole-fat dairy.  If you have high blood pressure, you may need to limit your sodium intake or follow a diet such as the DASH eating plan. DASH is an eating plan that aims to lower high blood pressure. What foods are recommended? The items listed below may not be a complete list. Talk with your dietitian about what dietary choices are best for you. Grains Whole grains, such as whole-wheat or whole-grain breads, crackers, cereals, and pasta. Unsweetened oatmeal. Bulgur. Barley. Quinoa. Brown rice. Corn or whole-wheat flour tortillas or taco shells. Vegetables Lettuce. Spinach. Peas. Beets. Cauliflower. Cabbage. Broccoli. Carrots. Tomatoes. Squash.  Eggplant. Herbs. Peppers. Onions. Cucumbers. Brussels sprouts. Fruits Berries. Bananas. Apples. Oranges. Grapes. Papaya. Mango. Pomegranate. Kiwi. Grapefruit. Cherries. Meats and other protein foods Seafood. Poultry without skin. Lean cuts of pork and beef. Tofu. Eggs. Nuts. Beans. Dairy Low-fat or fat-free dairy products, such as yogurt, cottage cheese, and cheese. Beverages Water. Tea. Coffee. Sugar-free or diet soda. Seltzer water. Lowfat or no-fat milk. Milk alternatives, such as soy or almond milk. Fats and oils Olive oil. Canola oil. Sunflower oil. Grapeseed oil. Avocado. Walnuts. Sweets and desserts Sugar-free or low-fat pudding. Sugar-free or low-fat ice cream and other frozen treats. Seasoning and other foods Herbs. Sodium-free spices. Mustard. Relish. Low-fat, low-sugar ketchup. Low-fat, low-sugar barbecue sauce. Low-fat or fat-free mayonnaise. What foods are not recommended? The items  listed below may not be a complete list. Talk with your dietitian about what dietary choices are best for you. Grains Refined white flour and flour products, such as bread, pasta, snack foods, and cereals. Vegetables Canned vegetables. Frozen vegetables with butter or cream sauce. Fruits Fruits canned with syrup. Meats and other protein foods Fatty cuts of meat. Poultry with skin. Breaded or fried meat. Processed meats. Dairy Full-fat yogurt, cheese, or milk. Beverages Sweetened drinks, such as sweet iced tea and soda. Fats and oils Butter. Lard. Ghee. Sweets and desserts Baked goods, such as cake, cupcakes, pastries, cookies, and cheesecake. Seasoning and other foods Spice mixes with added salt. Ketchup. Barbecue sauce. Mayonnaise. Summary  To prevent diabetes from developing, you may need to make diet and other lifestyle changes to help control blood sugar, improve cholesterol levels, and manage your blood pressure.  Set weight loss goals with the help of your health care team. It is recommended that most people with prediabetes lose 7 percent of their current body weight.  Consider following a Mediterranean diet that includes plenty of fresh fruits and vegetables, whole grains, beans, nuts, seeds, fish, lean meat, low-fat dairy, and healthy oils. This information is not intended to replace advice given to you by your health care provider. Make sure you discuss any questions you have with your health care provider. Document Revised: 11/26/2018 Document Reviewed: 10/08/2016 Elsevier Patient Education  2020 [employer].

## 2020-06-11 NOTE — Progress Notes (Signed)
Chronic Care Management Pharmacy  Name: Shelly Johnson MRN: [id number]   DOB: 08-28-1945  Chief Complaint/ HPI Shelly Johnson, 74 y.o., female, presents for their initial CCM visit with the clinical pharmacist via telephone due to COVID-19 pandemic .  PCP : [name], MD Encounter Diagnoses  Name Primary?  . Essential hypertension Yes  . Hyperglycemia   . Hyperlipidemia, unspecified hyperlipidemia type     Office Visits:  06/04/2020 (PCP): swelling 1+ on right, trace on left ankle. tx w/ compression stockings if DVT negative after [place]. Diet/exercise recommendations made, OSA testing encouraged. Previously in upper end of pre-diabetes range oted with 01/2020 a1c 6.5. Day of visit a1c decreased to 5.7%. working on weight loss, will return 6 mos.  Patient Active Problem List   Diagnosis Date Noted  . Hyperglycemia 01/27/2020  . Senile purpura ([place]) 01/27/2020  . Hyperlipidemia, unspecified 09/14/2019  . DDD (degenerative disc disease), lumbar 11/16/2018  . Morbid obesity ([place]) 04/28/2018  . Numbness and tingling of both feet 01/21/2018  . Depression, major, single episode, moderate ([place]) 06/20/2017  . Essential hypertension 08/23/2016  . Dyspnea on exertion 08/23/2016  . ANEMIA-NOS 03/16/2008  . Hiatal hernia with gastroesophageal reflux 03/08/2008   Past Surgical History:  Procedure Laterality Date  . ABDOMINAL HYSTERECTOMY     states had a "few cancer cells" but they "got everything". ? ovaries and cervix.   . APPENDECTOMY    . HIATAL HERNIA REPAIR     Family History  Problem Relation Age of Onset  . Arthritis Mother   . Uterine cancer Mother        uterine  . Diabetes Brother   . Hypertension Brother   . Breast cancer Maternal Grandmother   . Colon cancer Maternal Aunt 96  . Colon polyps Neg Hx   . Esophageal cancer Neg Hx   . Stomach cancer Neg Hx    Allergies  Allergen Reactions  . Nsaids     Should avoid-- GI bleed  . Hydrocodone Nausea And  Vomiting   Outpatient Encounter Medications as of 06/11/2020  Medication Sig  . chlorthalidone (HYGROTON) 25 MG tablet TAKE ONE TABLET BY MOUTH DAILY  . ferrous sulfate 325 (65 FE) MG tablet Take 1 tablet (325 mg total) by mouth 2 (two) times daily.  Marland Kitchen gentamicin cream (GARAMYCIN) 0.1 % Apply 1 application topically 2 (two) times daily.  Marland Kitchen KLOR-CON M20 20 MEQ tablet TAKE ONE TABLET BY MOUTH DAILY AS NEEDED AS INSTRUCTED BY MD  . omeprazole (PRILOSEC) 40 MG capsule TAKE ONE CAPSULE BY MOUTH DAILY  . vitamin C (ASCORBIC ACID) 500 MG tablet Take 500 mg by mouth daily.     No facility-administered encounter medications on file as of 06/11/2020.   Patient Care Team    Relationship Specialty Notifications Start End  [name], MD PCP - General Family Medicine  04/25/14    Comment: Marolyn Hammock, Legrand Como, MD Consulting Physician Cardiology  02/05/17   Paralee Cancel, MD Consulting Physician Orthopedic Surgery  02/05/17   Edrick Kins, [place] Consulting Physician Podiatry  01/27/20   Madelin Rear, [facility] Pharmacist Pharmacist  03/29/20    Comment: [phone]   Current Diagnosis/Assessment: Goals Addressed            This Visit's Progress   . PharmD Care Plan       CARE PLAN ENTRY (see longitudinal plan of care for additional care plan information)  Current Barriers:  . Chronic Disease Management support, education, and  care coordination needs related to Hypertension, Hyperlipidemia, and hyperglycemia/prediabetes   Hypertension BP Readings from Last 3 Encounters:  06/04/20 132/64  01/27/20 (!) 142/86  09/14/19 131/80   . Pharmacist Clinical Goal(s): o Over the next 180 days, patient will work with PharmD and providers to achieve BP goal <130/80 . Current regimen:  o Chlorthalidone 25 mg once daily . Interventions: o Diet and exercise recommendations  . Patient self care activities - Over the next 180 days, patient will: o Check BP at least once every 1-2 days, document, and  provide at future appointments o Ensure daily salt intake < 2300 mg/day  Hyperlipidemia Lab Results  Component Value Date/Time   LDLCALC 106 (H) 07/18/2013 10:18 AM   LDLDIRECT 113.0 09/16/2019 02:05 PM   . Pharmacist Clinical Goal(s): o Over the next 180 days, patient will work with PharmD and providers to achieve LDL goal < 100 . Current regimen:  o N/a . Interventions: o Diet and exercise recommendations o Discussed statin therapy for CVD prevention . Patient self care activities - Over the next 180 days, patient will: o Continue current management  Hyperglycemia/Prediabetes Lab Results  Component Value Date/Time   HGBA1C 5.7 (H) 06/04/2020 03:38 PM   HGBA1C 6.5 01/31/2020 03:55 PM   . Pharmacist Clinical Goal(s): o Over the next 180 days, patient will work with PharmD and providers to maintain A1c goal <6.5% . Current regimen:  o N/a . Interventions: o Review diet plans . Patient self care activities - Over the next 180 days, patient will: o Check blood sugar only if directed, document, and provide at future appointments o Contact provider with any episodes of hypoglycemia  Medication management . Pharmacist Clinical Goal(s): o Over the next 180 days, patient will work with PharmD and providers to maintain optimal medication adherence . [facility] . Interventions o Comprehensive medication review performed. o Continue current medication management strategy . Patient self care activities - Over the next 180 days, patient will: o Take medications as prescribed o Report any questions or concerns to PharmD and/or provider(s) Initial goal documentation.      Hypertension   BP goal <130/80  BP Readings from Last 3 Encounters:  06/04/20 132/64  01/27/20 (!) 142/86  09/14/19 131/80   Previous medications: n/a. Patient checks BP at home infrequently. Recent home readings: today 10/25 - 132/81.  Patient is currently near goal on the following  medications:  . Chlorthalidone 25 mg once daily   We discussed diet and exercise extensively. Used to walk routinely 1-2 miles/day, ankle/leg pain limits exercise but had fallen out of habit. Discussed goal of getting out for walks 2-3x/week and to not focus on distance/time at first. We discussed replacing snack foods such as cookies/chocolate with nuts and focus on white meats like skinless chicken and fish over red meats.  Discussed consistent administration of BP medications. Is taking nightly - has some concern that there could be some low BP, encouraged monitoring multiple times throughout the day over the next week and maintaining log.  Plan  Continue current medications. November f/u call to check in on BP & diet/exercise.  Hyperglycemia   A1c goal < 6.5%  Lab Results  Component Value Date/Time   HGBA1C 5.7 (H) 06/04/2020 03:38 PM   HGBA1C 6.5 01/31/2020 03:55 PM    BMP Latest Ref Rng & Units 06/04/2020 02/09/2020 01/31/2020  Glucose 65 - 99 mg/dL 93 102(H) 122(H)  BUN 7 - 25 mg/dL 13 14 18   Creatinine 0.60 -  0.93 mg/dL 0.86 0.85 0.96  BUN/Creat Ratio 6 - 22 (calc) NOT APPLICABLE - -  Sodium 335 - 146 mmol/L 141 137 139  Potassium 3.5 - 5.3 mmol/L 3.9 3.4(L) 3.2(L)  Chloride 98 - 110 mmol/L 98 99 97  CO2 20 - 32 mmol/L 33(H) 32 34(H)  Calcium 8.6 - 10.4 mg/dL 9.6 9.4 9.3   Checking BG: n/a. Recent FBG readings: n/a. Previous medications: n/a. 05/2020 a1c 5.7% improved from 01/2020 a1c 6.5%. Patient is currently at goal on the following medications:  . n/a  We discussed: diet and exercise extensively - see HTN.   Plan  Continue current management  Hyperlipidemia   LDL goal < 100 over next 6 months  Lipid Panel     Component Value Date/Time   CHOL 199 09/16/2019 1405   TRIG 319.0 (H) 09/16/2019 1405   HDL 53.00 09/16/2019 1405   LDLCALC 106 (H) 07/18/2013 1018   LDLDIRECT 113.0 09/16/2019 1405    Hepatic Function Latest Ref Rng & Units 06/04/2020 01/31/2020  09/16/2019  Total Protein 6.1 - 8.1 g/dL 6.6 6.5 6.8  Albumin 3.5 - 5.2 g/dL - 4.2 4.2  AST 10 - 35 U/L 20 17 22   ALT 6 - 29 U/L 33(H) 25 39(H)  Alk Phosphatase 39 - 117 U/L - 52 56  Total Bilirubin 0.2 - 1.2 mg/dL 0.5 0.4 0.5  Bilirubin, Direct 0.0 - 0.3 mg/dL - - -    The 10-year ASCVD risk score Mikey Bussing DC Jr., et al., 2013) is: 16.5%   Values used to calculate the score:     Age: 85 years     Sex: Female     Is Non-Hispanic African American: No     Diabetic: No     Tobacco smoker: No     Systolic Blood Pressure: 456 mmHg     Is BP treated: Yes     HDL Cholesterol: 53 mg/dL     Total Cholesterol: 199 mg/dL   Patient has failed these meds in past: n/a. Patient is currently not at goal the following medications:  . N/a.  We discussed diet and exercise extensively - see HTN.   Plan  Continue current management  GERD   Patient denies recent acid reflux.  Currently controlled on: . Omeprazole 40 mg once daily   We discussed: Avoidance of potential triggers such as alcohol, fatty foods, lying down after eating, and tomato sauce.  Plan   Continue current medications.  Bone Health   Last DEXA Scan: 2018 - normal bone density. No results found for: VD25OH.  Previous medications: n/a. Patient is not a candidate for pharmacologic treatment.   Plan  Recommend 2512554122 units of vitamin D daily.  Recommend 1200 mg of calcium daily from dietary and supplemental sources.  Recommend weight-bearing and muscle strengthening exercises for building and maintaining bone density.  Vaccines   Immunization History  Administered Date(s) Administered  . Fluad Quad(high Dose 65+) 06/04/2020  . Influenza, High Dose Seasonal PF 07/04/2016, 06/19/2017, 04/28/2018  . Influenza,inj,Quad PF,6+ Mos 07/18/2013, 04/25/2014  . Janssen (J&J) SARS-COV-2 Vaccination 02/09/2020  . Pneumococcal Conjugate-13 07/18/2013, 04/03/2015  . Pneumococcal Polysaccharide-23 07/04/2016  . Tdap 07/21/2013    Reviewed patient's vaccination history.    Plan  Recommend patient receive J&J covid vaccine booster.  Medication Management / Care Coordination   Receives prescription medications from:  [facility] [address] [place] [address] [place] [address] Phone: [phone] Fax: [phone]  Denies any issues with current medication management.   Plan  Continue current medication management strategy. ___________________________ SDOH (Social Determinants of Health) assessments performed: Yes.  Future Appointments  Date Time Provider [place]  06/19/2020 11:00 AM [facility] VASC 3 [facility]  09/11/2020  1:00 PM [facility] CCM PHARMACIST [facility]  12/06/2020  1:20 PM [name], MD [facility]   Visit follow-up:  . CPA follow-up: 1 week BP call due to patient concern of potential low Bps - advised to test multiple times throughout the day at different times and to keep a log. Check on progress with walking 2-3x week and changes with diet. We discussed replacing snack foods such as cookies/chocolate with nuts and focus on white meats like skinless chicken and fish over red meats. Marland Kitchen RPH follow-up: 3 month telephone visit.   Madelin Rear, Pharm.D., BCGP Clinical Pharmacist [facility] [phone]

## 2020-06-19 ENCOUNTER — Other Ambulatory Visit: Payer: Self-pay

## 2020-06-19 ENCOUNTER — Ambulatory Visit (HOSPITAL_COMMUNITY)
Admission: RE | Admit: 2020-06-19 | Discharge: 2020-06-19 | Disposition: A | Discharge status: Home / Self Care | Payer: Medicare Other | Source: Ambulatory Visit | Attending: Cardiovascular Disease | Admitting: Cardiovascular Disease

## 2020-06-19 DIAGNOSIS — M7989 Other specified soft tissue disorders: Secondary | ICD-10-CM | POA: Diagnosis not present

## 2020-07-19 ENCOUNTER — Ambulatory Visit: Payer: Medicare Other | Admitting: Family Medicine

## 2020-07-20 ENCOUNTER — Encounter (INDEPENDENT_AMBULATORY_CARE_PROVIDER_SITE_OTHER): Payer: Self-pay

## 2020-07-23 ENCOUNTER — Telehealth: Payer: Self-pay

## 2020-07-23 NOTE — Progress Notes (Unsigned)
Chronic Care Management Pharmacy Assistant   Name: Shelly Johnson  MRN: [id number] DOB: [birth year]  Reason for Encounter: Disease State  PCP : [name], MD  Allergies:   Allergies  Allergen Reactions  . Nsaids     Should avoid-- GI bleed  . Hydrocodone Nausea And Vomiting    Medications: Outpatient Encounter Medications as of 07/23/2020  Medication Sig  . chlorthalidone (HYGROTON) 25 MG tablet TAKE ONE TABLET BY MOUTH DAILY  . ferrous sulfate 325 (65 FE) MG tablet Take 1 tablet (325 mg total) by mouth 2 (two) times daily.  Marland Kitchen gentamicin cream (GARAMYCIN) 0.1 % Apply 1 application topically 2 (two) times daily.  Marland Kitchen KLOR-CON M20 20 MEQ tablet TAKE ONE TABLET BY MOUTH DAILY AS NEEDED AS INSTRUCTED BY MD  . omeprazole (PRILOSEC) 40 MG capsule TAKE ONE CAPSULE BY MOUTH DAILY  . vitamin C (ASCORBIC ACID) 500 MG tablet Take 500 mg by mouth daily.     No facility-administered encounter medications on file as of 07/23/2020.    Current Diagnosis: Patient Active Problem List   Diagnosis Date Noted  . Hyperglycemia 01/27/2020  . Senile purpura ([place]) 01/27/2020  . Hyperlipidemia, unspecified 09/14/2019  . DDD (degenerative disc disease), lumbar 11/16/2018  . Morbid obesity ([place]) 04/28/2018  . Numbness and tingling of both feet 01/21/2018  . Depression, major, single episode, moderate ([place]) 06/20/2017  . Essential hypertension 08/23/2016  . Dyspnea on exertion 08/23/2016  . ANEMIA-NOS 03/16/2008  . Hiatal hernia with gastroesophageal reflux 03/08/2008    Reviewed chart prior to disease state call. Spoke with patient regarding BP  Recent Office Vitals: BP Readings from Last 3 Encounters:  06/04/20 132/64  01/27/20 (!) 142/86  09/14/19 131/80   Pulse Readings from Last 3 Encounters:  06/04/20 82  01/27/20 62  11/16/18 96    Wt Readings from Last 3 Encounters:  06/04/20 222 lb (100.7 kg)  01/27/20 221 lb (100.2 kg)  09/14/19 218 lb (98.9 kg)     Kidney  Function Lab Results  Component Value Date/Time   CREATININE 0.86 06/04/2020 03:38 PM   CREATININE 0.85 02/09/2020 11:46 AM   CREATININE 0.96 01/31/2020 03:55 PM   GFR 65.73 02/09/2020 11:46 AM   GFRNONAA 67 06/04/2020 03:38 PM   GFRAA 78 06/04/2020 03:38 PM    BMP Latest Ref Rng & Units 06/04/2020 02/09/2020 01/31/2020  Glucose 65 - 99 mg/dL 93 102(H) 122(H)  BUN 7 - 25 mg/dL 13 14 18   Creatinine 0.60 - 0.93 mg/dL 0.86 0.85 0.96  BUN/Creat Ratio 6 - 22 (calc) NOT APPLICABLE - -  Sodium 696 - 146 mmol/L 141 137 139  Potassium 3.5 - 5.3 mmol/L 3.9 3.4(L) 3.2(L)  Chloride 98 - 110 mmol/L 98 99 97  CO2 20 - 32 mmol/L 33(H) 32 34(H)  Calcium 8.6 - 10.4 mg/dL 9.6 9.4 9.3    . Current antihypertensive regimen:  chlorthalidone (HYGROTON) 25 MG tablet . How often are you checking your Blood Pressure? {CHL HP BP Monitoring [phone]} . Current home BP readings: *** . What recent interventions/DTPs have been made by any provider to improve Blood Pressure control since last CPP Visit: N/A . Any recent hospitalizations or ED visits since last visit with CPP? No . What diet changes have been made to improve Blood Pressure Control?  o *** . What exercise is being done to improve your Blood Pressure Control?  o ***  Adherence Review: Is the patient currently on ACE/ARB medication? No Does the  patient have >5 day gap between last estimated fill dates? CPP to review   [name] ,[name] Pharmacist Assistant [phone]  Follow-Up:  Pharmacist Review

## 2020-08-01 ENCOUNTER — Ambulatory Visit (INDEPENDENT_AMBULATORY_CARE_PROVIDER_SITE_OTHER): Payer: Medicare Other | Admitting: Podiatry

## 2020-08-01 ENCOUNTER — Other Ambulatory Visit: Payer: Self-pay

## 2020-08-01 ENCOUNTER — Ambulatory Visit (INDEPENDENT_AMBULATORY_CARE_PROVIDER_SITE_OTHER): Payer: Medicare Other

## 2020-08-01 DIAGNOSIS — M25571 Pain in right ankle and joints of right foot: Secondary | ICD-10-CM

## 2020-08-01 DIAGNOSIS — M79671 Pain in right foot: Secondary | ICD-10-CM

## 2020-08-01 DIAGNOSIS — M659 Synovitis and tenosynovitis, unspecified: Secondary | ICD-10-CM

## 2020-08-01 DIAGNOSIS — G8929 Other chronic pain: Secondary | ICD-10-CM

## 2020-08-15 NOTE — Progress Notes (Signed)
   Subjective:  74 y.o. female presenting today as an established patient for new complaint regarding ankle pain to the bilateral ankles right greater than the left.  Patient states it is only minimally symptomatic and is aggravated by increased walking and wearing shoes.  She notices by the end of the day she does have swollen feet.  Patient did have ultrasound of the ankles which were normal. The patient also complains of numbness into the bilateral great toes.  She states that the toes are very tender to touch.  She also states that the bilateral great toenails are discolored and thickened with elongated nails.   Past Medical History:  Diagnosis Date  . Anemia   . Anxiety and depression    during loss of husband  . Arthritis   . Diverticulosis   . GERD (gastroesophageal reflux disease)   . GI bleed    hx of; hgb as low as 5.9  . Hemorrhoids   . Hiatal hernia   . Obesity   . Pneumonia   . Urinary tract bacterial infections      Objective / Physical Exam:  General:  The patient is alert and oriented x3 in no acute distress. Dermatology:  Skin is warm, dry and supple bilateral lower extremities. Negative for open lesions or macerations.  Hyperkeratotic dystrophic elongated nails noted to the bilateral great toes Vascular:  Palpable pedal pulses bilaterally. No edema or erythema noted. Capillary refill within normal limits. Neurological:  Epicritic and protective threshold grossly intact bilaterally.  Musculoskeletal Exam:  Pain on palpation to the anterior lateral medial aspects of the patient's right ankle. Mild edema noted. Range of motion within normal limits to all pedal and ankle joints bilateral. Muscle strength 5/5 in all groups bilateral.   Assessment: 1.  Synovitis/capsulitis right ankle 2.  Symptomatic painful elongated nails bilateral great toes 3.  Bilateral lower extremity edema  Plan of Care:  1. Patient was evaluated.  2.  Patient declined any prescription  anti-inflammatory or injections today 3.  Recommend compression hose daily.  Patient states that she has a pair of compression hose at home 4.  Recommend OTC Tylenol as needed 5.  Mechanical debridement of the nails to the bilateral great toes was performed using a nail nipper without incident or bleeding.  Patient felt immediate relief 6.  Return to clinic as needed   Felecia Shelling, DPM [facility]  Dr. Felecia Shelling, DPM    [address]                                        [place], [place] [address]                Office [phone]  Fax [phone]

## 2020-09-05 ENCOUNTER — Telehealth: Payer: Self-pay

## 2020-09-05 NOTE — Chronic Care Management (AMB) (Signed)
Chronic Care Management Pharmacy Assistant   Name: Eulalia Ellerman  MRN: [id number] DOB: 08/28/1945  Reason for Encounter: Disease State/ Hypertension Adherence Call   PCP : [name], MD  Allergies:   Allergies  Allergen Reactions  . Nsaids     Should avoid-- GI bleed  . Hydrocodone Nausea And Vomiting    Medications: Outpatient Encounter Medications as of 09/05/2020  Medication Sig  . chlorthalidone (HYGROTON) 25 MG tablet TAKE ONE TABLET BY MOUTH DAILY  . ferrous sulfate 325 (65 FE) MG tablet Take 1 tablet (325 mg total) by mouth 2 (two) times daily.  Marland Kitchen gentamicin cream (GARAMYCIN) 0.1 % Apply 1 application topically 2 (two) times daily.  Marland Kitchen KLOR-CON M20 20 MEQ tablet TAKE ONE TABLET BY MOUTH DAILY AS NEEDED AS INSTRUCTED BY MD  . omeprazole (PRILOSEC) 40 MG capsule TAKE ONE CAPSULE BY MOUTH DAILY  . vitamin C (ASCORBIC ACID) 500 MG tablet Take 500 mg by mouth daily.     No facility-administered encounter medications on file as of 09/05/2020.    Current Diagnosis: Patient Active Problem List   Diagnosis Date Noted  . Hyperglycemia 01/27/2020  . Senile purpura ([place]) 01/27/2020  . Hyperlipidemia, unspecified 09/14/2019  . DDD (degenerative disc disease), lumbar 11/16/2018  . Morbid obesity ([place]) 04/28/2018  . Numbness and tingling of both feet 01/21/2018  . Depression, major, single episode, moderate ([place]) 06/20/2017  . Essential hypertension 08/23/2016  . Dyspnea on exertion 08/23/2016  . ANEMIA-NOS 03/16/2008  . Hiatal hernia with gastroesophageal reflux 03/08/2008    Reviewed chart prior to disease state call. Spoke with patient regarding BP  Recent Office Vitals: BP Readings from Last 3 Encounters:  06/04/20 132/64  01/27/20 (!) 142/86  09/14/19 131/80   Pulse Readings from Last 3 Encounters:  06/04/20 82  01/27/20 62  11/16/18 96    Wt Readings from Last 3 Encounters:  06/04/20 222 lb (100.7 kg)  01/27/20 221 lb (100.2 kg)  09/14/19 218  lb (98.9 kg)     Kidney Function Lab Results  Component Value Date/Time   CREATININE 0.86 06/04/2020 03:38 PM   CREATININE 0.85 02/09/2020 11:46 AM   CREATININE 0.96 01/31/2020 03:55 PM   GFR 65.73 02/09/2020 11:46 AM   GFRNONAA 67 06/04/2020 03:38 PM   GFRAA 78 06/04/2020 03:38 PM    BMP Latest Ref Rng & Units 06/04/2020 02/09/2020 01/31/2020  Glucose 65 - 99 mg/dL 93 102(H) 122(H)  BUN 7 - 25 mg/dL 13 14 18   Creatinine 0.60 - 0.93 mg/dL 0.86 0.85 0.96  BUN/Creat Ratio 6 - 22 (calc) NOT APPLICABLE - -  Sodium 485 - 146 mmol/L 141 137 139  Potassium 3.5 - 5.3 mmol/L 3.9 3.4(L) 3.2(L)  Chloride 98 - 110 mmol/L 98 99 97  CO2 20 - 32 mmol/L 33(H) 32 34(H)  Calcium 8.6 - 10.4 mg/dL 9.6 9.4 9.3    . Current antihypertensive regimen:  o Chlorthalidone 25 mg tablet daily.  . How often are you checking your Blood Pressure? Patient states she checks her blood pressure about 2-3 times a month.  . Current home BP readings: 120/77 HR- 93 (08/18/2020), 122/73  HR-91 (08/28/2020)  . What recent interventions/DTPs have been made by any provider to improve Blood Pressure control since last CPP Visit: Patient states she is currently taking her blood pressure medication as prescribed. Patient states she has been under a lot of stress recently due to having to have a lot of repairs done to her  older home.  . Any recent hospitalizations or ED visits since last visit with CPP? No , patient has not had any recent hospitalizations or ED visits since her last visit with Madelin Rear, Pharm.D., BCGP.  Marland Kitchen What diet changes have been made to improve Blood Pressure Control?  o Patient states she tries to cut back on salt and foods with a lot of sodium.  . What exercise is being done to improve your Blood Pressure Control?  o Patient states she is currently doing small exercises such as leg lifts and squats for about 10-15 minutes to keep her sciatica moving. Patient states she also stays very active cleaning  house and doing yard work.   How is progress with walking 2-3x a week.  Patient states she has not been walking as advised and previously discussed. Patient states she is not able with this weather. Patient states she has sciatica pain that prevents her from doing a lot of walking. Patient states she has sciatica pain that radiates down her leg so she uses a stick to walk with for steadiness.  How is progress with replacing snack foods such as cookies/chocolate with nuts and focus on white meats like skinless chicken and fish over red meats.  Patient states she has not done very well with this as she loves red meat. Patient states she also eats chicken and fish. She tries to alternate as much as possible. Patient states she has added nuts as a snack food. She states she will also eat cookies and chocolate with the nuts. She states she has not done very well at cutting these snack down.  Adherence Review: Is the patient currently on ACE/ARB medication? No Does the patient have >5 day gap between last estimated fill dates? No   April D Calh[place], [place] Pharmacist Assistant [phone]    Follow-Up:  Pharmacist Review

## 2020-09-11 ENCOUNTER — Telehealth: Payer: Medicare Other

## 2020-09-24 ENCOUNTER — Telehealth: Payer: Self-pay

## 2020-09-24 NOTE — Telephone Encounter (Signed)
See below

## 2020-09-24 NOTE — Telephone Encounter (Signed)
Called and lm on pt vm with below. 

## 2020-09-24 NOTE — Telephone Encounter (Signed)
Patient is calling in wondering if Dr.Hunter recommends Dr.Eric Thornell Mule as a good ENT.

## 2020-09-24 NOTE — Telephone Encounter (Signed)
Yes thanks- but I think he is located in [place]

## 2020-09-30 ENCOUNTER — Other Ambulatory Visit: Payer: Self-pay | Admitting: Family Medicine

## 2020-10-08 ENCOUNTER — Telehealth: Payer: Self-pay

## 2020-10-08 ENCOUNTER — Ambulatory Visit (INDEPENDENT_AMBULATORY_CARE_PROVIDER_SITE_OTHER): Payer: Medicare Other

## 2020-10-08 DIAGNOSIS — I10 Essential (primary) hypertension: Secondary | ICD-10-CM | POA: Diagnosis not present

## 2020-10-08 DIAGNOSIS — E785 Hyperlipidemia, unspecified: Secondary | ICD-10-CM

## 2020-10-08 DIAGNOSIS — K219 Gastro-esophageal reflux disease without esophagitis: Secondary | ICD-10-CM

## 2020-10-08 NOTE — Chronic Care Management (AMB) (Signed)
  Chronic Care Management   Outreach Note   Name: Shelly Johnson MRN: [id number] DOB: 04/24/1946  Referred by: [name], MD Reason for referral: Telephone Appointment with [place] Pharmacist, Madelin Rear.   An unsuccessful telephone outreach was attempted today. The patient was referred to the pharmacist for assistance with care management and care coordination.   Telephone appointment with clinical pharmacist today (10/08/2020) at 1030am. If patient immediately returns call, transfer to [phone]. Otherwise, please provide this number so patient can reschedule visit.   Madelin Rear, Pharm.D., BCGP Clinical Pharmacist [facility] [phone]

## 2020-10-08 NOTE — Progress Notes (Signed)
Chronic Care Management Pharmacy Note Pt returned call-visit completed.   10/08/2020 Name:  Shelly Johnson MRN:  [id number] DOB:  06/25/1946  Subjective: Shelly Johnson is an 75 y.o. year old female who is a primary patient of Hunter, Brayton Mars, MD.  The CCM team was consulted for assistance with disease management and care coordination needs.   Engaged with patient by telephone for follow up visit in response to provider referral for pharmacy case management and/or care coordination services.   Consent to Services:  The patient was given information about Chronic Care Management services, agreed to services, and gave verbal consent prior to initiation of services.  Please see initial visit note for detailed documentation.  Patient Care Team: [name], MD as PCP - General (Family Medicine) Sherren Mocha, MD as Consulting Physician (Cardiology) Paralee Cancel, MD as Consulting Physician (Orthopedic Surgery) Edrick Kins, DPM as Consulting Physician (Podiatry) Madelin Rear, [facility] as Pharmacist (Pharmacist)  Recent office visits: 06/11/2020 [facility]): pt concern of potential low Bps, diet/lifestyle focus. Recent consult visits: 08/01/2020 ([place]): negative for DVT.   Objective: Lab Results  Component Value Date   CREATININE 0.86 06/04/2020   BUN 13 06/04/2020   GFR 65.73 02/09/2020   GFRNONAA 67 06/04/2020   GFRAA 78 06/04/2020   NA 141 06/04/2020   K 3.9 06/04/2020   CALCIUM 9.6 06/04/2020   CO2 33 (H) 06/04/2020   Lab Results  Component Value Date/Time   HGBA1C 5.7 (H) 06/04/2020 03:38 PM   HGBA1C 6.5 01/31/2020 03:55 PM   GFR 65.73 02/09/2020 11:46 AM   GFR 57.12 (L) 01/31/2020 03:55 PM    Last diabetic Eye exam: No results found for: HMDIABEYEEXA  Last diabetic Foot exam: No results found for: HMDIABFOOTEX  Lab Results  Component Value Date   CHOL 199 09/16/2019   HDL 53.00 09/16/2019   LDLCALC 106 (H) 07/18/2013   LDLDIRECT 113.0 09/16/2019   TRIG  319.0 (H) 09/16/2019   CHOLHDL 4 09/16/2019   Hepatic Function Latest Ref Rng & Units 06/04/2020 01/31/2020 09/16/2019  Total Protein 6.1 - 8.1 g/dL 6.6 6.5 6.8  Albumin 3.5 - 5.2 g/dL - 4.2 4.2  AST 10 - 35 U/L _0 ALT 6 - 29 U/L 33(H) 25 39(H)  Alk Phosphatase 39 - 117 U/L - 52 56  Total Bilirubin 0.2 - 1.2 mg/dL 0.5 0.4 0.5  Bilirubin, Direct 0.0 - 0.3 mg/dL - - -   Lab Results  Component Value Date/Time   TSH 2.64 01/31/2020 03:55 PM   TSH 3.09 09/16/2019 02:05 PM   FREET4 1.07 03/09/2008 05:05 PM   CBC Latest Ref Rng & Units 01/31/2020 09/16/2019 01/21/2018  WBC 4.0 - 10.5 K/uL 8.2 9.8 6.6  Hemoglobin 12.0 - 15.0 g/dL 15.0 15.9(H) 15.7(H)  Hematocrit 36.0 - 46.0 % 45.0 47.6(H) 46.9(H)  Platelets 150.0 - 400.0 K/uL 189.0 190.0 196.0   No results found for: VD25OH  Clinical ASCVD: No  The 10-year ASCVD risk score Mikey Bussing DC Jr., et al., 2013) is: 16.5%   Values used to calculate the score:     Age: 79 years     Sex: Female     Is Non-Hispanic African American: No     Diabetic: No     Tobacco smoker: No     Systolic Blood Pressure: 992 mmHg     Is BP treated: Yes     HDL Cholesterol: 53 mg/dL     Total Cholesterol: 199 mg/dL  Depression screen [facility] 2/9 06/04/2020 01/27/2020 09/14/2019  Decreased Interest 0 0 1  Down, Depressed, Hopeless 0 0 0  PHQ - 2 Score 0 0 1  Altered sleeping 0 0 0  Tired, decreased energy 0 3 1  Change in appetite 0 0 1  Feeling bad or failure about yourself  0 0 0  Trouble concentrating 0 3 0  Moving slowly or fidgety/restless 0 0 0  Suicidal thoughts 0 0 0  PHQ-9 Score 0 6 3  Difficult doing work/chores - Not difficult at all Not difficult at all    Social History   Tobacco Use  Smoking Status Never Smoker  Smokeless Tobacco Never Used   BP Readings from Last 3 Encounters:  06/04/20 132/64  01/27/20 (!) 142/86  09/14/19 131/80   Pulse Readings from Last 3 Encounters:  06/04/20 82  01/27/20 62  11/16/18 96   Wt Readings from  Last 3 Encounters:  06/04/20 222 lb (100.7 kg)  01/27/20 221 lb (100.2 kg)  09/14/19 218 lb (98.9 kg)   Assessment/Interventions: Review of patient past medical history, allergies, medications, health status, including review of consultants reports, laboratory and other test data, was performed as part of comprehensive evaluation and provision of chronic care management services.   SDOH:  (Social Determinants of Health) assessments and interventions performed: Yes  CCM Care Plan Allergies  Allergen Reactions  . Nsaids     Should avoid-- GI bleed  . Hydrocodone Nausea And Vomiting   Medications Reviewed Today    Reviewed by Weyman Pedro, RN (Registered Nurse) on 08/01/20 at 1208  Med List Status: <None>  Medication Order Taking? Sig Documenting Provider Last Dose Status Informant  chlorthalidone (HYGROTON) 25 MG tablet [id number] No TAKE ONE TABLET BY MOUTH DAILY [name], MD Taking Active   ferrous sulfate 325 (65 FE) MG tablet [id number] No Take 1 tablet (325 mg total) by mouth 2 (two) times daily. Irene Shipper, MD Taking Active   gentamicin cream (GARAMYCIN) 0.1 % [id number] No Apply 1 application topically 2 (two) times daily. Edrick Kins, DPM Taking Active   KLOR-CON M20 20 MEQ tablet [id number] No TAKE ONE TABLET BY MOUTH DAILY AS NEEDED AS INSTRUCTED BY MD [name], MD Taking Active   omeprazole (PRILOSEC) 40 MG capsule [id number] No TAKE ONE CAPSULE BY MOUTH DAILY [name], MD Taking Active   vitamin C (ASCORBIC ACID) 500 MG tablet [id number] No Take 500 mg by mouth daily.   [provider] Taking Active          Patient Active Problem List   Diagnosis Date Noted  . Hyperglycemia 01/27/2020  . Senile purpura ([place]) 01/27/2020  . Hyperlipidemia, unspecified 09/14/2019  . DDD (degenerative disc disease), lumbar 11/16/2018  . Morbid obesity ([place]) 04/28/2018  . Numbness and tingling of both feet 01/21/2018  . Depression, major, single  episode, moderate ([place]) 06/20/2017  . Essential hypertension 08/23/2016  . Dyspnea on exertion 08/23/2016  . ANEMIA-NOS 03/16/2008  . Hiatal hernia with gastroesophageal reflux 03/08/2008   Immunization History  Administered Date(s) Administered  . Fluad Quad(high Dose 65+) 06/04/2020  . Influenza, High Dose Seasonal PF 07/04/2016, 06/19/2017, 04/28/2018  . Influenza,inj,Quad PF,6+ Mos 07/18/2013, 04/25/2014  . Janssen (J&J) SARS-COV-2 Vaccination 02/09/2020  . Pneumococcal Conjugate-13 07/18/2013, 04/03/2015  . Pneumococcal Polysaccharide-23 07/04/2016  . Tdap 07/21/2013    Conditions to be addressed/monitored:  Hypertension and Hyperlipidemia Care Plan : [place]  Updates made by Madelin Rear,  RPH since 10/08/2020 12:00 AM    Problem: HLD HTN GERD     Long-Range Goal: Patient-Specific Goal   Start Date: 10/08/2020  Expected End Date: 10/08/2021  This Visit's Progress: On track  Priority: High  Note:   Medication Assistance: None required.  Patient affirms current coverage meets needs. Patient's preferred pharmacy is:  [facility] [address] [place] [address] Phone: [phone] Fax: [phone]  Patient decided to: Continue current medication management strategy Patient agrees to Care Plan and Follow-up.  Current Barriers:  . Working to optimize blood pressure/cholesterol management through diet and exercise related changes  Pharmacist Clinical Goal(s):  Marland Kitchen Over the next 365 days, patient will achieve adherence to monitoring guidelines and medication adherence to achieve therapeutic efficacy . contact provider office for questions/concerns as evidenced notation of same in electronic health record through collaboration with PharmD and provider.   Interventions: . 1:1 collaboration with [name], MD regarding development and update of comprehensive plan of care as evidenced  by provider attestation and co-signature . Inter-disciplinary care team collaboration (see longitudinal plan of care) . Comprehensive medication review performed; medication list updated in electronic medical record  Hypertension (BP goal <130/80) -controlled -Current treatment: . Chlorthalidone 25 mg once daily -Medications previously tried: n/a  -Current home readings: generally at goal at home but has not tested lately due ongoing stress with home repairs and concerns with brother's health decline as he battles stage 4 cancer  -Current dietary habits: [place] for the [place] to look after brother, has not been able to closely follow dietary recommendations. -Current exercise habits: see above -Denies hypotensive/hypertensive symptoms -Educated on BP goals and benefits of medications for prevention of heart attack, stroke and kidney damage; Daily salt intake goal < 2300 mg; Exercise goal of 150 minutes per week; Importance of home blood pressure monitoring; -Counseled to monitor BP at home, document, and provide log at future appointments -Counseled on diet and exercise extensively Recommended to continue current medication  Hyperlipidemia: (LDL goal < 100 over next six months) -controlled -Current treatment: . n/a -Medications previously tried:   -Current dietary patterns: see htn -Current exercise habits: see htn -Educated on Exercise goal of 150 minutes per week; -Counseled on diet and exercise extensively  Acid reflux (Goal: ensure safe medication use) -controlled -Current treatment  . Omeprazole 40 mg once daily -Counseled on diet and exercise extensively Recommended to continue current medication Due to present stress, will discuss further in 2-3 months  Patient Goals/Self-Care Activities . Over the next 365 days, patient will:  - take medications as prescribed target a minimum of 150 minutes of moderate intensity exercise weekly      Future Appointments   Date Time Provider [place]  12/06/2020  1:20 PM [name], MD [facility]  01/07/2021 11:00 AM [facility] CCM PHARMACIST [facility]   Follow-up plan with Care Management Team: . CPA: n/a . RPH: 3 month telephone visit  Madelin Rear, Pharm.D., BCGP Clinical Pharmacist [place] [phone]

## 2020-10-08 NOTE — Patient Instructions (Signed)
Ms. Hoey,  Thank you for taking the time to review your medications with me today.  I have included our care plan/goals in the following pages. Please review and call me at [phone] with any questions!  Thanks! Ellin Mayhew, Pharm.D., BCGP Clinical Pharmacist [facility] [phone] Patient Care Plan: [place] Plan    Problem Identified: HLD HTN GERD     Long-Range Goal: Patient-Specific Goal   Start Date: 10/08/2020  Expected End Date: 10/08/2021  This Visit's Progress: On track  Priority: High  Note:   Medication Assistance: None required.  Patient affirms current coverage meets needs. Patient's preferred pharmacy is:  [facility] [address], [place] [address] Phone: [phone] Fax: [phone]  Patient decided to: Continue current medication management strategy Patient agrees to Care Plan and Follow-up.  Current Barriers:  . Working to optimize blood pressure/cholesterol management through diet and exercise related changes  Pharmacist Clinical Goal(s):  Marland Kitchen Over the next 365 days, patient will achieve adherence to monitoring guidelines and medication adherence to achieve therapeutic efficacy . contact provider office for questions/concerns as evidenced notation of same in electronic health record through collaboration with PharmD and provider.   Interventions: . 1:1 collaboration with [name], MD regarding development and update of comprehensive plan of care as evidenced by provider attestation and co-signature . Inter-disciplinary care team collaboration (see longitudinal plan of care) . Comprehensive medication review performed; medication list updated in electronic medical record  Hypertension (BP goal <130/80) -controlled -Current treatment: . Chlorthalidone 25 mg once daily -Medications previously tried:  n/a  -Current home readings: generally at goal at home but has not tested lately due ongoing stress with home repairs and concerns with brother's health decline as he battles stage 4 cancer  -Current dietary habits: [place] for the [place] to [place] brother, has not been able to closely follow dietary recommendations. -Current exercise habits: see above -Denies hypotensive/hypertensive symptoms -Educated on BP goals and benefits of medications for prevention of heart attack, stroke and kidney damage; Daily salt intake goal < 2300 mg; Exercise goal of 150 minutes per week; Importance of home blood pressure monitoring; -Counseled to monitor BP at home, document, and provide log at future appointments -Counseled on diet and exercise extensively Recommended to continue current medication  Hyperlipidemia: (LDL goal < 100 over next six months) -controlled -Current treatment: . n/a -Medications previously tried:   -Current dietary patterns: see htn -Current exercise habits: see htn -Educated on Exercise goal of 150 minutes per week; -Counseled on diet and exercise extensively  Acid reflux (Goal: ensure safe medication use) -controlled -Current treatment  . Omeprazole 40 mg once daily -Counseled on diet and exercise extensively Recommended to continue current medication Due to present stress, will discuss further in 2-3 months  Patient Goals/Self-Care Activities . Over the next 365 days, patient will:  - take medications as prescribed target a minimum of 150 minutes of moderate intensity exercise weekly      The patient verbalized understanding of instructions provided today and agreed to receive a MyChart copy of patient instruction and/or educational materials. Telephone follow up appointment with pharmacy team member scheduled for: See next appointment with "Care Management Staff" under "What's Next" below.

## 2020-10-10 ENCOUNTER — Other Ambulatory Visit: Payer: Self-pay | Admitting: Podiatry

## 2020-10-10 DIAGNOSIS — M659 Synovitis and tenosynovitis, unspecified: Secondary | ICD-10-CM

## 2020-10-25 DIAGNOSIS — H9393 Unspecified disorder of ear, bilateral: Secondary | ICD-10-CM | POA: Diagnosis not present

## 2020-10-25 DIAGNOSIS — H6123 Impacted cerumen, bilateral: Secondary | ICD-10-CM | POA: Diagnosis not present

## 2020-10-25 DIAGNOSIS — H9313 Tinnitus, bilateral: Secondary | ICD-10-CM | POA: Diagnosis not present

## 2020-10-25 DIAGNOSIS — H903 Sensorineural hearing loss, bilateral: Secondary | ICD-10-CM | POA: Diagnosis not present

## 2020-11-29 DIAGNOSIS — M79661 Pain in right lower leg: Secondary | ICD-10-CM | POA: Diagnosis not present

## 2020-11-29 DIAGNOSIS — M79605 Pain in left leg: Secondary | ICD-10-CM | POA: Diagnosis not present

## 2020-11-29 DIAGNOSIS — M25561 Pain in right knee: Secondary | ICD-10-CM | POA: Diagnosis not present

## 2020-11-29 DIAGNOSIS — M79604 Pain in right leg: Secondary | ICD-10-CM | POA: Diagnosis not present

## 2020-11-30 ENCOUNTER — Other Ambulatory Visit (HOSPITAL_COMMUNITY): Payer: Self-pay | Admitting: Physician Assistant

## 2020-11-30 ENCOUNTER — Ambulatory Visit (HOSPITAL_COMMUNITY)
Admission: RE | Admit: 2020-11-30 | Discharge: 2020-11-30 | Disposition: A | Discharge status: Home / Self Care | Payer: Medicare Other | Source: Ambulatory Visit | Attending: Cardiovascular Disease | Admitting: Cardiovascular Disease

## 2020-11-30 ENCOUNTER — Other Ambulatory Visit: Payer: Self-pay

## 2020-11-30 DIAGNOSIS — M79604 Pain in right leg: Secondary | ICD-10-CM

## 2020-12-03 ENCOUNTER — Ambulatory Visit: Payer: Medicare Other | Admitting: Family Medicine

## 2020-12-06 ENCOUNTER — Ambulatory Visit: Payer: Medicare Other | Admitting: Family Medicine

## 2020-12-14 ENCOUNTER — Telehealth: Payer: Self-pay

## 2020-12-14 DIAGNOSIS — R32 Unspecified urinary incontinence: Secondary | ICD-10-CM

## 2020-12-14 DIAGNOSIS — H9319 Tinnitus, unspecified ear: Secondary | ICD-10-CM

## 2020-12-14 NOTE — Telephone Encounter (Signed)
Pt called asking if Dr. Yong Channel would place 2 referrals for her:   1) a referral to Dr. Vicie Mutters ENT for ringing in ears   2) a referral for gynecology. Pt has not seen one in years.   Please advise.

## 2020-12-14 NOTE — Telephone Encounter (Signed)
Referrals have been placed.  

## 2020-12-14 NOTE — Telephone Encounter (Signed)
Patient called back would like a referral sent to [facility] with Dr. Vikki Ports instead of Gynecology she stated her friend told her need urology since she having problems with leakage.

## 2020-12-20 ENCOUNTER — Telehealth: Payer: Self-pay

## 2020-12-20 NOTE — Telephone Encounter (Signed)
Pt asked for CMA to give her a call about her visit from 10/08/2020. Please advise.

## 2020-12-20 NOTE — Telephone Encounter (Signed)
Patient received a bill of 128.70, the bill states that the provider charge is $225 in regards to the visit that she was supposed to have with you on 10/08/2020 it looks like in your notes you never got in touch with her so she's a tad bit confused on how she is being charged for this appointment when she technically never has the appointment. Please advise and give her a call back or you could just message me and I will call and discuss everything with her.   She's also confused as to why her insurance wasn't able to cover the visit IF she would've been seen.

## 2020-12-24 NOTE — Telephone Encounter (Signed)
Shelly Johnson I spoke with the patient and now she does remember talking to you, but she also stated that she hasn't ever had to pay for a visit speaking with you. Could you please call her and explain more. I don't want to tell her the wrong things.

## 2020-12-25 NOTE — Telephone Encounter (Signed)
Patient is calling in stating she missed a call from [place].

## 2020-12-25 NOTE — Telephone Encounter (Signed)
Spoke with patient regarding charge. Believes she may have also paid into deductible same day at different office so is working to clarify this. Numbers for [facility] billing provided to patient.  Would like to continue with ccm pharmacist visit but would prefer to call me moving forward if wanting to set up a visit.

## 2021-01-03 NOTE — Patient Instructions (Addendum)
We will call you within two weeks about your referral to ENT and urogynecology. If you do not hear within 2 weeks, give [place] a call.   blood pressure mildly high in office. I asked her to monitor at home daily for next 5 days then update me through call or mychart. If numbers elevated at home we may need to add medication such as amlodipine. Goal at home <135/85  Team please change all labs to future labs and have her come back fasting in next few weeks.Marland KitchenMarland KitchenSchedule a lab visit at the check out desk within 2 weeks. Return for future fasting labs meaning nothing but water after midnight please. Ok to take your medications with water.   Recommended follow up: Return in about 6 months (around 07/07/2021) for follow up- or sooner if needed such as if blood pressure higher.

## 2021-01-03 NOTE — Progress Notes (Signed)
Phone [phone] In person visit   Subjective:   Shelly Johnson is a 75 y.o. year old very pleasant female patient who presents for/with See problem oriented charting Chief Complaint  Patient presents with  . Hyperlipidemia  . Hypertension  . Depression  . Hyperglycemia    This visit occurred during the SARS-CoV-2 public health emergency.  Safety protocols were in place, including screening questions prior to the visit, additional usage of staff PPE, and extensive cleaning of exam room while observing appropriate contact time as indicated for disinfecting solutions.   Past Medical History-  Patient Active Problem List   Diagnosis Date Noted  . Hyperglycemia 01/27/2020    Priority: Medium  . Hyperlipidemia, unspecified 09/14/2019    Priority: Medium  . Numbness and tingling of both feet 01/21/2018    Priority: Medium  . Depression, major, single episode, moderate ([place]) 06/20/2017    Priority: Medium  . Essential hypertension 08/23/2016    Priority: Medium  . ANEMIA-NOS 03/16/2008    Priority: Medium  . Morbid obesity ([place]) 04/28/2018    Priority: Low  . Hiatal hernia with gastroesophageal reflux 03/08/2008    Priority: Low  . Senile purpura ([place]) 01/27/2020  . DDD (degenerative disc disease), lumbar 11/16/2018  . Dyspnea on exertion 08/23/2016    Medications- reviewed and updated Current Outpatient Medications  Medication Sig Dispense Refill  . chlorthalidone (HYGROTON) 25 MG tablet TAKE ONE TABLET BY MOUTH DAILY 90 tablet 2  . ferrous sulfate 325 (65 FE) MG tablet Take 1 tablet (325 mg total) by mouth 2 (two) times daily. (Patient taking differently: Take 325 mg by mouth daily.) 60 tablet 3  . omeprazole (PRILOSEC) 40 MG capsule TAKE ONE CAPSULE BY MOUTH DAILY 90 capsule 2  . vitamin C (ASCORBIC ACID) 500 MG tablet Take 500 mg by mouth daily.       No current facility-administered medications for this visit.     Objective:  BP (!) 148/90   Pulse 90   Temp  98.3 F (36.8 C) (Temporal)   Ht 5\' 1"  (1.549 m)   Wt 220 lb 12.8 oz (100.2 kg)   SpO2 97%   BMI 41.72 kg/m  Gen: NAD, resting comfortably CV: RRR no murmurs rubs or gallops Lungs: CTAB no crackles, wheeze, rhonchi Ext: trace edema Skin: warm, dry    Assessment and Plan   #hypertension S: medication: Chlorthalidone 25mg  daily Home readings #s: not checking recently BP Readings from Last 3 Encounters:  01/04/21 (!) 148/90  06/04/20 132/64  01/27/20 (!) 142/86  A/P: blood pressure mildly high in office. I asked her to monitor at home daily for next 5 days then update me through call or mychart. If numbers elevated at home we may need to add medication such as amlodipine. Goal at home <135/85  #hyperlipidemia S: Medication:none  Lab Results  Component Value Date   CHOL 199 09/16/2019   HDL 53.00 09/16/2019   LDLCALC 106 (H) 07/18/2013   LDLDIRECT 113.0 09/16/2019   TRIG 319.0 (H) 09/16/2019   CHOLHDL 4 09/16/2019   A/P: poor control on last check- will have to do fasting labs in next few weeks- she agrees to come back for these  # Depression/stress S: Medication:none   -Pipe burst at house in December- still having issues getting this fixed -Her last living brother just died on 21-Jan-2023- had gone to [place] to see him and she is glad she did  -had water coming from her ceiling yesterday A/P: patient with  significant recent stressors. Thrilled phq9 remains low- she feels like she will recover from the stress but for now it is difficult.   # Hyperglycemia/insulin resistance/prediabetes S:  Medication: none Exercise and diet- weight down 2 pounds from last visit- congratulated patient Lab Results  Component Value Date   HGBA1C 5.7 (H) 06/04/2020   HGBA1C 6.5 01/31/2020   HGBA1C 6.1 09/16/2019    A/P: congratulated on weight loss- she has done a good job bringing a1c down- will check again when comes back for labs -asks about help with weight loss- discussed ozempic but  declines  #Low energy/daytime sleepiness-I recommended OSA testing but she has declined in the past and again today   #Right knee pain- just got off walker and cane recently. Working with Dr. Alvan Dame- apparently bad flare of arthritis after a fall in February  # Tinnitus  S:since December with machines in house to help with fluid. evne when took machines she has continued to hear buzzing. Not pulsatile  A/P: requests ent- referral- placed today   # Urinary incontinence S:patient reports issues with coughing or sneezing having urination. Sometimes cannot make it in the morning. Some urgency to go  A/P: patient requests seeing urogynecology- will refer her. Sounds like stress incontinence.   Recommended follow up: Return in about 6 months (around 07/07/2021) for follow up- or sooner if needed such as if blood pressure higher. Future Appointments  Date Time Provider [place]  01/07/2021 11:00 AM [facility] CCM PHARMACIST [facility]   Lab/Order associations:   ICD-10-CM   1. Essential hypertension  I10   2. Hyperlipidemia, unspecified hyperlipidemia type  E78.5 CBC with Differential/Platelet    Comprehensive metabolic panel    Lipid panel  3. Hyperglycemia  R73.9 Hemoglobin A1c  4. Depression, major, single episode, moderate (HCC)  F32.1   5. Tinnitus of both ears  H93.13 Ambulatory referral to ENT  6. Urinary incontinence, unspecified type  R32 POCT Urinalysis Dipstick (Automated)    Urine Culture    Ambulatory referral to Urogynecology    No orders of the defined types were placed in this encounter.  Return precautions advised.  Garret Reddish, MD

## 2021-01-04 ENCOUNTER — Ambulatory Visit (INDEPENDENT_AMBULATORY_CARE_PROVIDER_SITE_OTHER): Payer: Medicare Other | Admitting: Family Medicine

## 2021-01-04 ENCOUNTER — Other Ambulatory Visit: Payer: Self-pay

## 2021-01-04 ENCOUNTER — Encounter: Payer: Self-pay | Admitting: Family Medicine

## 2021-01-04 VITALS — BP 148/90 | HR 90 | Temp 98.3°F | Ht 61.0 in | Wt 220.8 lb

## 2021-01-04 DIAGNOSIS — H9313 Tinnitus, bilateral: Secondary | ICD-10-CM

## 2021-01-04 DIAGNOSIS — R739 Hyperglycemia, unspecified: Secondary | ICD-10-CM | POA: Diagnosis not present

## 2021-01-04 DIAGNOSIS — F321 Major depressive disorder, single episode, moderate: Secondary | ICD-10-CM

## 2021-01-04 DIAGNOSIS — E785 Hyperlipidemia, unspecified: Secondary | ICD-10-CM | POA: Diagnosis not present

## 2021-01-04 DIAGNOSIS — I10 Essential (primary) hypertension: Secondary | ICD-10-CM | POA: Diagnosis not present

## 2021-01-04 DIAGNOSIS — R32 Unspecified urinary incontinence: Secondary | ICD-10-CM

## 2021-01-04 LAB — POC URINALSYSI DIPSTICK (AUTOMATED)
Bilirubin, UA: NEGATIVE
Blood, UA: NEGATIVE
Glucose, UA: NEGATIVE
Ketones, UA: NEGATIVE
Leukocytes, UA: NEGATIVE
Nitrite, UA: NEGATIVE
Protein, UA: NEGATIVE
Spec Grav, UA: 1.015 (ref 1.010–1.025)
Urobilinogen, UA: 0.2 E.U./dL
pH, UA: 6 (ref 5.0–8.0)

## 2021-01-04 NOTE — Addendum Note (Signed)
Addended by: Brandy Hale on: 01/04/2021 01:44 PM   Modules accepted: Orders

## 2021-01-06 LAB — URINE CULTURE
MICRO NUMBER:: 11916407
SPECIMEN QUALITY:: ADEQUATE

## 2021-01-07 ENCOUNTER — Telehealth: Payer: Medicare Other

## 2021-01-09 DIAGNOSIS — M1711 Unilateral primary osteoarthritis, right knee: Secondary | ICD-10-CM | POA: Diagnosis not present

## 2021-01-09 DIAGNOSIS — M2391 Unspecified internal derangement of right knee: Secondary | ICD-10-CM | POA: Diagnosis not present

## 2021-02-07 ENCOUNTER — Ambulatory Visit (INDEPENDENT_AMBULATORY_CARE_PROVIDER_SITE_OTHER): Payer: Medicare Other | Admitting: Otolaryngology

## 2021-02-07 ENCOUNTER — Other Ambulatory Visit: Payer: Self-pay

## 2021-02-07 DIAGNOSIS — H903 Sensorineural hearing loss, bilateral: Secondary | ICD-10-CM | POA: Diagnosis not present

## 2021-02-07 DIAGNOSIS — H9313 Tinnitus, bilateral: Secondary | ICD-10-CM | POA: Diagnosis not present

## 2021-02-07 DIAGNOSIS — H9113 Presbycusis, bilateral: Secondary | ICD-10-CM

## 2021-02-07 NOTE — Progress Notes (Signed)
HPI: Shelly Johnson is a 75 y.o. female who presents is referred by her PCP Dr. Yong Channel for evaluation of tinnitus.  Prior to December she had no problems with her hearing or ringing in her ears.  But apparently toward the end of December she had a leak of water in her house that required work and placement of loud machines in the house for close to 2 weeks in order to dry the wood and remove the moisture.  Apparently these machines ran constantly and were loud.  When they finally removed the machines she continue to have the sound as if the machines were still there with constant buzzing in her ears. She was in [place] in January and February to help her brother and Wynetta Fines an ENT doctor while in [place] the first part of February who apparently got a hearing test that looked okay and told her that it was related to stress and in her head that there was nothing abnormal with the ears. She has continued to have the tinnitus in her ears and is referred here for further evaluation.  Unfortunately she does not have a hearing test that were performed and pulled..  Past Medical History:  Diagnosis Date   Anemia    Anxiety and depression    during loss of husband   Arthritis    Diverticulosis    GERD (gastroesophageal reflux disease)    GI bleed    hx of; hgb as low as 5.9   Hemorrhoids    Hiatal hernia    Obesity    Pneumonia    Urinary tract bacterial infections    Past Surgical History:  Procedure Laterality Date   ABDOMINAL HYSTERECTOMY     states had a "few cancer cells" but they "got everything". ? ovaries and cervix.    APPENDECTOMY     HIATAL HERNIA REPAIR     Social History   Socioeconomic History   Marital status: Widowed    Spouse name: Not on file   Number of children: 2   Years of education: Not on file   Highest education level: Not on file  Occupational History   Occupation: Retired  Tobacco Use   Smoking status: Never   Smokeless tobacco: Never  Vaping Use   Vaping  Use: Never used  Substance and Sexual Activity   Alcohol use: Yes    Comment: 1 per month   Drug use: No   Sexual activity: Not on file  Other Topics Concern   Not on file  Social History Narrative   Widowed 03/02/2007. 2 sons. 4 grandkids (2 girls/2 boys).       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, Occupational hygienist, yardwork         Social Determinants of Health   Financial Resource Strain: Not on file  Food Insecurity: No Food Insecurity   Worried About Charity fundraiser in the Last Year: Never true   Arboriculturist in the Last Year: Never true  Transportation Needs: Not on file  Physical Activity: Not on file  Stress: Not on file  Social Connections: Not on file   Family History  Problem Relation Age of Onset   Arthritis Mother    Uterine cancer Mother        uterine   Diabetes Brother        unknown cause   Hypertension Brother    Diabetes Brother  complications after amputation   Breast cancer Maternal Grandmother    Colon cancer Maternal Aunt 96   CAD Brother        medical error as reported cause of death   Lung cancer Brother        youngest   Colon polyps Neg Hx    Esophageal cancer Neg Hx    Stomach cancer Neg Hx    Allergies  Allergen Reactions   Nsaids     Should avoid-- GI bleed   Hydrocodone Nausea And Vomiting   Prior to Admission medications   Medication Sig Start Date End Date Taking? Authorizing Provider  chlorthalidone (HYGROTON) 25 MG tablet TAKE ONE TABLET BY MOUTH DAILY 10/01/20   [name], MD  ferrous sulfate 325 (65 FE) MG tablet Take 1 tablet (325 mg total) by mouth 2 (two) times daily. Patient taking differently: Take 325 mg by mouth daily. 06/27/14   Irene Shipper, MD  omeprazole (PRILOSEC) 40 MG capsule TAKE ONE CAPSULE BY MOUTH DAILY 10/01/20   [name], MD  vitamin C (ASCORBIC ACID) 500 MG tablet Take 500 mg by mouth daily.      [provider]      Positive ROS: Otherwise negative  All other systems have been reviewed and were otherwise negative with the exception of those mentioned in the HPI and as above.  Physical Exam: Constitutional: Alert, well-appearing, no acute distress Ears: External ears without lesions or tenderness. Ear canals are clear bilaterally with intact, clear TMs bilaterally. Nasal: External nose without lesions. Septum with minimal deformity.. Clear nasal passages Oral: Lips and gums without lesions. Tongue and palate mucosa without lesions. Posterior oropharynx clear. Neck: No palpable adenopathy or masses Respiratory: Breathing comfortably  Skin: No facial/neck lesions or rash noted.  Audiologic testing demonstrated low normal hearing in the low and mid frequencies.  She has a downsloping sensorineural hearing loss in both ears which was symmetric above 4000 frequency dropping down to 70 dB at 6 and 8000 frequency.  SRT's were 20 DB bilaterally with tight A tympanograms bilaterally.  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  Plan: Reviewed the audiogram with the patient in the office today. Discussed that there is limited treatment options for tinnitus.  Discussed with her concerning using masking noise when the tinnitus is bad. Also discussed with her concerning using ear protection when around loud noise as this can make tinnitus worse.   Radene Journey, MD   CC:

## 2021-02-26 ENCOUNTER — Other Ambulatory Visit: Payer: Self-pay | Admitting: Family Medicine

## 2021-02-26 DIAGNOSIS — Z1231 Encounter for screening mammogram for malignant neoplasm of breast: Secondary | ICD-10-CM

## 2021-02-27 DIAGNOSIS — H5203 Hypermetropia, bilateral: Secondary | ICD-10-CM | POA: Diagnosis not present

## 2021-02-27 DIAGNOSIS — H2513 Age-related nuclear cataract, bilateral: Secondary | ICD-10-CM | POA: Diagnosis not present

## 2021-02-27 DIAGNOSIS — H1789 Other corneal scars and opacities: Secondary | ICD-10-CM | POA: Diagnosis not present

## 2021-03-14 ENCOUNTER — Telehealth: Payer: Self-pay | Admitting: Pharmacist

## 2021-03-14 NOTE — Chronic Care Management (AMB) (Addendum)
Chronic Care Management Pharmacy Assistant   Name: Anetha Korst  MRN: [id number] DOB: 02-Nov-1945  Reason for Encounter: Disease State - Hypertension     Recent office visits:  01/04/21 Garret Reddish, MD - Family Medicine - Hypertension - Labs ordered. Referral to ENT and Urogynecology placed. Monitor blood pressures for next 5 days at home. Goal <135-85. Follow up for fasting labs in 2 weeks and office visit in 6 months.   Recent consult visits:  02/07/21 Melony Overly, MD - ENT - Tinnitus of both ears - No medication changes. No follow up indicated.   01/09/21 Paralee Cancel, MD - Orthopedic Surgery - Knee pain - No notes available.   11/29/20 Berna Spare, MD - Orthopedic - Right knee pain - No notes available.  Hospital visits:  None in previous 6 months  Medications: Outpatient Encounter Medications as of 03/14/2021  Medication Sig   chlorthalidone (HYGROTON) 25 MG tablet TAKE ONE TABLET BY MOUTH DAILY   ferrous sulfate 325 (65 FE) MG tablet Take 1 tablet (325 mg total) by mouth 2 (two) times daily. (Patient taking differently: Take 325 mg by mouth daily.)   omeprazole (PRILOSEC) 40 MG capsule TAKE ONE CAPSULE BY MOUTH DAILY   vitamin C (ASCORBIC ACID) 500 MG tablet Take 500 mg by mouth daily.     No facility-administered encounter medications on file as of 03/14/2021.    Reviewed chart prior to disease state call. Spoke with patient regarding BP  Recent Office Vitals: BP Readings from Last 3 Encounters:  01/04/21 (!) 148/90  06/04/20 132/64  01/27/20 (!) 142/86   Pulse Readings from Last 3 Encounters:  01/04/21 90  06/04/20 82  01/27/20 62    Wt Readings from Last 3 Encounters:  01/04/21 220 lb 12.8 oz (100.2 kg)  06/04/20 222 lb (100.7 kg)  01/27/20 221 lb (100.2 kg)     Kidney Function Lab Results  Component Value Date/Time   CREATININE 0.86 06/04/2020 03:38 PM   CREATININE 0.85 02/09/2020 11:46 AM   CREATININE 0.96 01/31/2020 03:55 PM   GFR  65.73 02/09/2020 11:46 AM   GFRNONAA 67 06/04/2020 03:38 PM   GFRAA 78 06/04/2020 03:38 PM    BMP Latest Ref Rng & Units 06/04/2020 02/09/2020 01/31/2020  Glucose 65 - 99 mg/dL 93 102(H) 122(H)  BUN 7 - 25 mg/dL '13 14 18  '$ Creatinine 0.60 - 0.93 mg/dL 0.86 0.85 0.96  BUN/Creat Ratio 6 - 22 (calc) NOT APPLICABLE - -  Sodium A999333 - 146 mmol/L 141 137 139  Potassium 3.5 - 5.3 mmol/L 3.9 3.4(L) 3.2(L)  Chloride 98 - 110 mmol/L 98 99 97  CO2 20 - 32 mmol/L 33(H) 32 34(H)  Calcium 8.6 - 10.4 mg/dL 9.6 9.4 9.3    Current antihypertensive regimen:  chlorthalidone (HYGROTON) 25 MG tablet  How often are you checking your Blood Pressure?  Patient reports she does not check her blood pressures routinely. She has been checking them intermittently.   Current home BP readings: 118/70 on 03/13/21  What recent interventions/DTPs have been made by any provider to improve Blood Pressure control since last CPP Visit: None noted.   Any recent hospitalizations or ED visits since last visit with CPP? No  What diet changes have been made to improve Blood Pressure Control?  Patient reports she does not like salt so she does not eat a lot of salt. She also pays attention to the sodium content in the food she buys.   What exercise is being  done to improve your Blood Pressure Control?  Patient reports she has not been as active due to the heat. She plans to get better about this once the weather cools off.   Adherence Review: Is the patient currently on ACE/ARB medication? No Does the patient have >5 day gap between last estimated fill dates? No  Medication Adherence Review:  chlorthalidone (HYGROTON) 25 MG tablet - last filled 01/16/21 90 days   Star Rating Drugs: None.  Jobe Gibbon, [facility] Clinical Pharmacist Assistant  [phone]

## 2021-03-20 ENCOUNTER — Emergency Department (HOSPITAL_BASED_OUTPATIENT_CLINIC_OR_DEPARTMENT_OTHER): Payer: Medicare Other | Admitting: Radiology

## 2021-03-20 ENCOUNTER — Emergency Department (HOSPITAL_BASED_OUTPATIENT_CLINIC_OR_DEPARTMENT_OTHER)
Admission: EM | Admit: 2021-03-20 | Discharge: 2021-03-20 | Disposition: A | Discharge status: Home / Self Care | Payer: Medicare Other | Attending: Emergency Medicine | Admitting: Emergency Medicine

## 2021-03-20 ENCOUNTER — Other Ambulatory Visit: Payer: Self-pay

## 2021-03-20 ENCOUNTER — Emergency Department (HOSPITAL_BASED_OUTPATIENT_CLINIC_OR_DEPARTMENT_OTHER): Payer: Medicare Other

## 2021-03-20 ENCOUNTER — Encounter (HOSPITAL_BASED_OUTPATIENT_CLINIC_OR_DEPARTMENT_OTHER): Payer: Self-pay

## 2021-03-20 DIAGNOSIS — R9431 Abnormal electrocardiogram [ECG] [EKG]: Secondary | ICD-10-CM | POA: Diagnosis not present

## 2021-03-20 DIAGNOSIS — R110 Nausea: Secondary | ICD-10-CM | POA: Diagnosis not present

## 2021-03-20 DIAGNOSIS — R0602 Shortness of breath: Secondary | ICD-10-CM | POA: Diagnosis not present

## 2021-03-20 DIAGNOSIS — I10 Essential (primary) hypertension: Secondary | ICD-10-CM | POA: Diagnosis not present

## 2021-03-20 DIAGNOSIS — K449 Diaphragmatic hernia without obstruction or gangrene: Secondary | ICD-10-CM | POA: Diagnosis not present

## 2021-03-20 DIAGNOSIS — G44209 Tension-type headache, unspecified, not intractable: Secondary | ICD-10-CM | POA: Diagnosis not present

## 2021-03-20 DIAGNOSIS — R519 Headache, unspecified: Secondary | ICD-10-CM

## 2021-03-20 DIAGNOSIS — H538 Other visual disturbances: Secondary | ICD-10-CM | POA: Diagnosis not present

## 2021-03-20 LAB — TROPONIN I (HIGH SENSITIVITY)
Troponin I (High Sensitivity): 3 ng/L (ref <18)
Troponin I (High Sensitivity): 3 ng/L (ref <18)

## 2021-03-20 LAB — URINALYSIS, ROUTINE W REFLEX MICROSCOPIC
Bilirubin Urine: NEGATIVE
Glucose, UA: NEGATIVE mg/dL
Hgb urine dipstick: NEGATIVE
Ketones, ur: NEGATIVE mg/dL
Leukocytes,Ua: NEGATIVE
Nitrite: NEGATIVE
Protein, ur: NEGATIVE mg/dL
Specific Gravity, Urine: 1.014 (ref 1.005–1.030)
pH: 7.5 (ref 5.0–8.0)

## 2021-03-20 LAB — COMPREHENSIVE METABOLIC PANEL
ALT: 29 U/L (ref 0–44)
AST: 19 U/L (ref 15–41)
Albumin: 4.2 g/dL (ref 3.5–5.0)
Alkaline Phosphatase: 48 U/L (ref 38–126)
Anion gap: 10 (ref 5–15)
BUN: 17 mg/dL (ref 8–23)
CO2: 31 mmol/L (ref 22–32)
Calcium: 9.1 mg/dL (ref 8.9–10.3)
Chloride: 100 mmol/L (ref 98–111)
Creatinine, Ser: 0.73 mg/dL (ref 0.44–1.00)
GFR, Estimated: >60 mL/min (ref >60)
Glucose, Bld: 114 mg/dL — ABNORMAL HIGH (ref 70–99)
Potassium: 3.9 mmol/L (ref 3.5–5.1)
Sodium: 141 mmol/L (ref 135–145)
Total Bilirubin: 0.6 mg/dL (ref 0.3–1.2)
Total Protein: 7 g/dL (ref 6.5–8.1)

## 2021-03-20 LAB — CBC WITH DIFFERENTIAL/PLATELET
Abs Immature Granulocytes: 0.03 10*3/uL (ref 0.00–0.07)
Basophils Absolute: 0 10*3/uL (ref 0.0–0.1)
Basophils Relative: 1 %
Eosinophils Absolute: 0 10*3/uL (ref 0.0–0.5)
Eosinophils Relative: 0 %
HCT: 47.5 % — ABNORMAL HIGH (ref 36.0–46.0)
Hemoglobin: 15.6 g/dL — ABNORMAL HIGH (ref 12.0–15.0)
Immature Granulocytes: 0 %
Lymphocytes Relative: 17 %
Lymphs Abs: 1.3 10*3/uL (ref 0.7–4.0)
MCH: 30.5 pg (ref 26.0–34.0)
MCHC: 32.8 g/dL (ref 30.0–36.0)
MCV: 92.8 fL (ref 80.0–100.0)
Monocytes Absolute: 0.3 10*3/uL (ref 0.1–1.0)
Monocytes Relative: 4 %
Neutro Abs: 6.2 10*3/uL (ref 1.7–7.7)
Neutrophils Relative %: 78 %
Platelets: 187 10*3/uL (ref 150–400)
RBC: 5.12 MIL/uL — ABNORMAL HIGH (ref 3.87–5.11)
RDW: 12.9 % (ref 11.5–15.5)
WBC: 7.9 10*3/uL (ref 4.0–10.5)
nRBC: 0 % (ref 0.0–0.2)

## 2021-03-20 MED ORDER — TETRACAINE HCL 0.5 % OP SOLN
2.0000 [drp] | Freq: Once | OPHTHALMIC | Status: AC
  Administered 2021-03-20: 2 [drp] via OPHTHALMIC
  Filled 2021-03-20: qty 4

## 2021-03-20 MED ORDER — FLUORESCEIN SODIUM 1 MG OP STRP
ORAL_STRIP | OPHTHALMIC | Status: AC
  Filled 2021-03-20: qty 1

## 2021-03-20 NOTE — ED Notes (Signed)
Pt in bed, pt states that her eye feels better and she took a little nap; however, she still has a little bit of a headache.

## 2021-03-20 NOTE — ED Provider Notes (Signed)
[facility] Provider Note   CSN: [id number] Arrival date & time: 03/20/21  1033     History Chief Complaint  Patient presents with   Headache    Kyerra Mabie is a 75 y.o. female.  HPI    75yo female with history of GI bleed, diverticulosis, hypertension, DDD, hyperlipidemia, presents with   Woke up this AM with pain in left eye, then nose started running out of the left, left jaw discomfort, felt somewhat disoriented, stressed, still having eye discomfort now, feeling much better now, head has cleared, nausea gone, still having mild pressure by nose and forehead  Initially woke up, felt hot, went to bathroom and started feeling funny then started having some mild pain in the eye that worsened over an hour. Eye discomfort and sinus nose frontal headache. Rarely has headaches.  5/10.   Denies numbness, weakness, difficulty talking or walking, visual changes or facial droop.   Felt a little nauseas walking to the door then felt needed to sit down, nausea, lightheaded.  No chest pain or discomfort. At one point did feel emotional, overwhelmed stressed because afraid and then felt dyspnea.  Both sides of nose dripping and stopped up, was ok yesterday, eye was not tearing.    Past Medical History:  Diagnosis Date   Anemia    Anxiety and depression    during loss of husband   Arthritis    Diverticulosis    GERD (gastroesophageal reflux disease)    GI bleed    hx of; hgb as low as 5.9   Hemorrhoids    Hiatal hernia    Obesity    Pneumonia    Urinary tract bacterial infections     Patient Active Problem List   Diagnosis Date Noted   Hyperglycemia 01/27/2020   Senile purpura ([place]) 01/27/2020   Hyperlipidemia, unspecified 09/14/2019   DDD (degenerative disc disease), lumbar 11/16/2018   Morbid obesity ([place]) 04/28/2018   Numbness and tingling of both feet 01/21/2018   Depression, major, single episode, moderate ([place]) 06/20/2017   Essential  hypertension 08/23/2016   Dyspnea on exertion 08/23/2016   ANEMIA-NOS 03/16/2008   Hiatal hernia with gastroesophageal reflux 03/08/2008    Past Surgical History:  Procedure Laterality Date   ABDOMINAL HYSTERECTOMY     states had a "few cancer cells" but they "got everything". ? ovaries and cervix.    APPENDECTOMY     HIATAL HERNIA REPAIR       OB History   No obstetric history on file.     Family History  Problem Relation Age of Onset   Arthritis Mother    Uterine cancer Mother        uterine   Diabetes Brother        unknown cause   Hypertension Brother    Diabetes Brother        complications after amputation   Breast cancer Maternal Grandmother    Colon cancer Maternal Aunt 96   CAD Brother        medical error as reported cause of death   Lung cancer Brother        youngest   Colon polyps Neg Hx    Esophageal cancer Neg Hx    Stomach cancer Neg Hx     Social History   Tobacco Use   Smoking status: Never   Smokeless tobacco: Never  Vaping Use   Vaping Use: Never used  Substance Use Topics   Alcohol use: Yes  Comment: 1 per month   Drug use: No    Home Medications Prior to Admission medications   Medication Sig Start Date End Date Taking? Authorizing Provider  chlorthalidone (HYGROTON) 25 MG tablet TAKE ONE TABLET BY MOUTH DAILY 10/01/20  Yes [name], MD  ferrous sulfate 325 (65 FE) MG tablet Take 1 tablet (325 mg total) by mouth 2 (two) times daily. Patient taking differently: Take 325 mg by mouth daily. 06/27/14  Yes Irene Shipper, MD  omeprazole (PRILOSEC) 40 MG capsule TAKE ONE CAPSULE BY MOUTH DAILY 10/01/20  Yes [name], MD  vitamin C (ASCORBIC ACID) 500 MG tablet Take 500 mg by mouth daily.     Yes [provider]    Allergies    Nsaids and Hydrocodone  Review of Systems   Review of Systems  Constitutional:  Negative for fever.  HENT:  Positive for congestion and rhinorrhea. Negative for sore throat.   Eyes:   Positive for pain. Negative for photophobia, discharge and visual disturbance.  Respiratory:  Negative for cough and shortness of breath.   Cardiovascular:  Negative for chest pain.  Gastrointestinal:  Positive for nausea. Negative for abdominal pain, blood in stool, diarrhea and vomiting.  Genitourinary:  Negative for difficulty urinating (incontinence with coughing, chronic no change).  Musculoskeletal:  Negative for back pain and neck pain.  Skin:  Negative for rash.  Neurological:  Positive for light-headedness and headaches. Negative for syncope, facial asymmetry, speech difficulty, weakness and numbness.   Physical Exam Updated Vital Signs BP 130/67   Pulse 77   Temp 98 F (36.7 C) (Oral)   Resp 17   Ht '5\' 2"'$  (1.575 m)   Wt 95.3 kg   SpO2 99%   BMI 38.41 kg/m   Physical Exam Vitals and nursing note reviewed.  Constitutional:      General: She is not in acute distress.    Appearance: Normal appearance. She is well-developed. She is not ill-appearing or diaphoretic.  HENT:     Head: Normocephalic and atraumatic.  Eyes:     General: No visual field deficit.    Extraocular Movements: Extraocular movements intact.     Conjunctiva/sclera: Conjunctivae normal.     Pupils: Pupils are equal, round, and reactive to light.  Cardiovascular:     Rate and Rhythm: Normal rate and regular rhythm.     Pulses: Normal pulses.     Heart sounds: Normal heart sounds. No murmur heard.   No friction rub. No gallop.  Pulmonary:     Effort: Pulmonary effort is normal. No respiratory distress.     Breath sounds: Normal breath sounds. No wheezing or rales.  Abdominal:     General: There is no distension.     Palpations: Abdomen is soft.     Tenderness: There is no abdominal tenderness. There is no guarding.  Musculoskeletal:        General: No swelling or tenderness.     Cervical back: Normal range of motion.  Skin:    General: Skin is warm and dry.     Findings: No erythema or rash.   Neurological:     General: No focal deficit present.     Mental Status: She is alert and oriented to person, place, and time.     GCS: GCS eye subscore is 4. GCS verbal subscore is 5. GCS motor subscore is 6.     Cranial Nerves: No cranial nerve deficit, dysarthria or facial asymmetry.  Sensory: No sensory deficit.     Motor: No weakness or tremor.     Coordination: Coordination normal. Finger-Nose-Finger Test normal.     Gait: Gait normal.    ED Results / Procedures / Treatments   Labs (all labs ordered are listed, but only abnormal results are displayed) Labs Reviewed  CBC WITH DIFFERENTIAL/PLATELET - Abnormal; Notable for the following components:      Result Value   RBC 5.12 (*)    Hemoglobin 15.6 (*)    HCT 47.5 (*)    All other components within normal limits  COMPREHENSIVE METABOLIC PANEL - Abnormal; Notable for the following components:   Glucose, Bld 114 (*)    All other components within normal limits  URINALYSIS, ROUTINE W REFLEX MICROSCOPIC  TROPONIN I (HIGH SENSITIVITY)  TROPONIN I (HIGH SENSITIVITY)    EKG EKG Interpretation  Date/Time:  Wednesday March 20 2021 10:51:38 EDT Ventricular Rate:  73 PR Interval:  143 QRS Duration: 85 QT Interval:  405 QTC Calculation: 447 R Axis:   58 Text Interpretation: Sinus rhythm Low voltage, precordial leads No significant change since last tracing Confirmed by Gareth Morgan [phone]) on 03/20/2021 11:36:14 AM  Radiology DG Chest 2 View  Result Date: 03/20/2021 CLINICAL DATA:  Shortness of breath EXAM: CHEST - 2 VIEW COMPARISON:  08/16/2018 FINDINGS: Large hiatal hernia. Heart is normal size. No confluent airspace opacities or effusions. No acute bony abnormality. IMPRESSION: Large hiatal hernia. No active disease. Electronically Signed   By: Rolm Baptise M.D.   On: 03/20/2021 12:09   CT HEAD WO CONTRAST (5MM)  Result Date: 03/20/2021 CLINICAL DATA:  Headache EXAM: CT HEAD WITHOUT CONTRAST TECHNIQUE: Contiguous axial  images were obtained from the base of the skull through the vertex without intravenous contrast. COMPARISON:  None. FINDINGS: Brain: No acute intracranial abnormality. Specifically, no hemorrhage, hydrocephalus, mass lesion, acute infarction, or significant intracranial injury. Vascular: No hyperdense vessel or unexpected calcification. Skull: No acute calvarial abnormality. Sinuses/Orbits: No acute findings Other: None IMPRESSION: Normal study. Electronically Signed   By: Rolm Baptise M.D.   On: 03/20/2021 12:08    Procedures Procedures   Medications Ordered in ED Medications  tetracaine (PONTOCAINE) 0.5 % ophthalmic solution 2 drop (2 drops Left Eye Given 03/20/21 1156)    ED Course  I have reviewed the triage vital signs and the nursing notes.  Pertinent labs & imaging results that were available during my care of the patient were reviewed by me and considered in my medical decision making (see chart for details).    MDM Rules/Calculators/A&P                            75yo female with history of GI bleed, diverticulosis, hypertension, DDD, hyperlipidemia, presents with eye pain, headache, congestion, jaw discomfort.   Given jaw discomfort, EKG was completed and evaluated by me and shows no acute findings.  Chest x-ray and troponins were completed showing no acute abnormalities, no signs of ACS.  Lab work shows no other significant signs of anemia or electrolyte abnormality.  Given new headache, CT head was completed which shows no acute abnormalities.  Doubt occult subarachnoid hemorrhage or CVA based off of history and exam.  No risk factors for CVT, normal EOM, no proptosis, feel this is less likely at this time. Intraocular pressures normal, no sign of corneal abrasions.  Hx of rhinorrhea/lacrimation may be consistent with cluster headache but quality and timing not consistent. Possible  tension headache. Symptoms not consistent with GCA. Discussed need for continued outpatient follow  up, return for new or worsening symptoms. Patient discharged in stable condition with understanding of reasons to return.    Final Clinical Impression(s) / ED Diagnoses Final diagnoses:  Acute non intractable tension-type headache  Acute nonintractable headache, unspecified headache type    Rx / DC Orders ED Discharge Orders     None        Gareth Morgan, MD 03/20/21 2238

## 2021-03-20 NOTE — ED Triage Notes (Signed)
Pt reports she experienced pain in her head over her left eye this morning.  States she could barely walk and she got nauseous.  She states EMS came to her home this morning and her BP was elevated, but she did not want to go with EMS.   Her neighbor drove her to the ED.  States she has a little head pressure.  Pt transfers from wheelchair to stretcher with minimal assistance and appears in NAD.

## 2021-04-09 ENCOUNTER — Ambulatory Visit (INDEPENDENT_AMBULATORY_CARE_PROVIDER_SITE_OTHER): Payer: Medicare Other | Admitting: Obstetrics and Gynecology

## 2021-04-09 ENCOUNTER — Other Ambulatory Visit: Payer: Self-pay

## 2021-04-09 ENCOUNTER — Encounter: Payer: Self-pay | Admitting: Obstetrics and Gynecology

## 2021-04-09 VITALS — BP 131/95 | HR 86 | Ht 61.0 in | Wt 216.0 lb

## 2021-04-09 DIAGNOSIS — R159 Full incontinence of feces: Secondary | ICD-10-CM | POA: Diagnosis not present

## 2021-04-09 DIAGNOSIS — N393 Stress incontinence (female) (male): Secondary | ICD-10-CM | POA: Diagnosis not present

## 2021-04-09 DIAGNOSIS — R3915 Urgency of urination: Secondary | ICD-10-CM

## 2021-04-09 DIAGNOSIS — N8110 Cystocele, unspecified: Secondary | ICD-10-CM | POA: Diagnosis not present

## 2021-04-09 LAB — POCT URINALYSIS DIPSTICK
Appearance: NORMAL
Bilirubin, UA: NEGATIVE
Blood, UA: NEGATIVE
Glucose, UA: NEGATIVE
Ketones, UA: NEGATIVE
Leukocytes, UA: NEGATIVE
Nitrite, UA: NEGATIVE
Protein, UA: NEGATIVE
Spec Grav, UA: 1.015 (ref 1.010–1.025)
Urobilinogen, UA: 0.2 E.U./dL
pH, UA: 5.5 (ref 5.0–8.0)

## 2021-04-09 NOTE — Patient Instructions (Signed)
Accidental Bowel Leakage: Our goal is to achieve formed bowel movements daily or every-other-day without leakage.  You may need to try different combinations of the following options to find what works best for you.  Some management options include: Dietary changes (more leafy greens, vegetables and fruits; less processed foods) Fiber supplementation (Metamucil or something with psyllium as active ingredient) Over-the-counter imodium (tablets or liquid) to help solidify the stool and prevent leakage of stool.  

## 2021-04-09 NOTE — Progress Notes (Signed)
[facility] New Patient Evaluation and Consultation  Referring Provider: [name], MD PCP: [name], MD Date of Service: 04/09/2021  SUBJECTIVE Chief Complaint: New Patient (Initial Visit) Shelly Johnson is a 75 y.o. female complains of incontinence/)  History of Present Illness: Shelly Johnson is a 75 y.o. White or Caucasian female seen in consultation at the request of Dr. Yong Channel for evaluation of inconitnence.    Review of records from Dr Yong Channel significant for: Leaking with cough and sneeze. Sometimes cannot make it to the bathroom in the morning.   Urinary Symptoms: Leaks urine with cough/ sneeze, laughing. In the morning when she wakes up she has a strong urge but is able to make it.  Has been happening 4-5 months. Has gained weight recently.  Leaks <1 time(s) per day.  Pad use: 1 pads per day.   She is bothered by her UI symptoms.  Day time voids 3-5.  Nocturia: 1-2 times per night to void. Voiding dysfunction: she empties her bladder well.  does not use a catheter to empty bladder.  When urinating, she feels she has no difficulties  UTIs:  0  UTI's in the last year.   Denies history of blood in urine and kidney or bladder stones  Pelvic Organ Prolapse Symptoms:                  She Denies a feeling of a bulge the vaginal area.    Bowel Symptom: Bowel movements: 2 time(s) per day, Bristol 4 Straining: no.  Splinting: no.  Incomplete evacuation: yes, sometimes She Admits to accidental bowel leakage / fecal incontinence  Occurs: 1 time(s) per week  Consistency with leakage: loose (Bristol 5) Bowel regimen: none   Sexual Function Sexually active: no.   Pelvic Pain Denies pelvic pain  Past Medical History:  Past Medical History:  Diagnosis Date   Anemia    Anxiety and depression    during loss of husband   Arthritis    Diverticulosis    GERD (gastroesophageal reflux disease)    GI bleed    hx of; hgb as low as 5.9    Hemorrhoids    Hiatal hernia    Obesity    Pneumonia    Urinary tract bacterial infections      Past Surgical History:   Past Surgical History:  Procedure Laterality Date   ABDOMINAL HYSTERECTOMY     states had a "few cancer cells" but they "got everything". ? ovaries and cervix.    APPENDECTOMY     HIATAL HERNIA REPAIR       Past OB/GYN History: OB History     Gravida  3   Para  2   Term  2   Preterm      AB  1   Living  2      SAB  1   IAB      Ectopic      Multiple      Live Births  2         SVD x2 S/p hysterectomy   Medications: She has a current medication list which includes the following prescription(s): chlorthalidone, ferrous sulfate, naproxen sodium, omeprazole, and vitamin c.   Allergies: Patient is allergic to nsaids and hydrocodone.   Social History:  Social History   Tobacco Use   Smoking status: Never   Smokeless tobacco: Never  Vaping Use   Vaping Use: Never used  Substance Use Topics  Alcohol use: Yes    Comment: 1 per month   Drug use: No    Relationship status: widowed She lives slone  She is not employed.   Family History:   Family History  Problem Relation Age of Onset   Arthritis Mother    Uterine cancer Mother        uterine   Diabetes Brother        unknown cause   Hypertension Brother    Diabetes Brother        complications after amputation   Breast cancer Maternal Grandmother    Colon cancer Maternal Aunt 96   CAD Brother        medical error as reported cause of death   Lung cancer Brother        youngest   Colon polyps Neg Hx    Esophageal cancer Neg Hx    Stomach cancer Neg Hx      Review of Systems: Review of Systems  Constitutional:  Positive for malaise/fatigue. Negative for fever and weight loss.  Respiratory:  Positive for shortness of breath. Negative for cough and wheezing.   Cardiovascular:  Positive for leg swelling. Negative for chest pain and palpitations.   Gastrointestinal:  Negative for abdominal pain and blood in stool.  Genitourinary:  Negative for dysuria.  Musculoskeletal:  Negative for myalgias.  Skin:  Negative for rash.  Neurological:  Negative for dizziness and headaches.  Endo/Heme/Allergies:  Does not bruise/bleed easily.  Psychiatric/Behavioral:  Negative for depression. The patient is not nervous/anxious.     OBJECTIVE Physical Exam: Vitals:   04/09/21 1542  BP: (!) 131/95  Pulse: 86  Weight: 216 lb (98 kg)  Height: '5\' 1"'$  (1.549 m)    Physical Exam Constitutional:      General: She is not in acute distress. Pulmonary:     Effort: Pulmonary effort is normal.  Abdominal:     General: There is no distension.     Palpations: Abdomen is soft.     Tenderness: There is no abdominal tenderness. There is no rebound.  Musculoskeletal:        General: No swelling. Normal range of motion.  Skin:    General: Skin is warm and dry.     Findings: No rash.  Neurological:     Mental Status: She is alert and oriented to person, place, and time.  Psychiatric:        Mood and Affect: Mood normal.        Behavior: Behavior normal.     GU / Detailed Urogynecologic Evaluation:  Pelvic Exam: Normal external female genitalia; Bartholin's and Skene's glands normal in appearance; urethral meatus normal in appearance, no urethral masses or discharge.   CST: negative  s/p hysterectomy: Speculum exam reveals normal vaginal mucosa with  atrophy and normal vaginal cuff.  Adnexa no mass, fullness, tenderness.     Pelvic floor strength II/V, puborectalis III/V external anal sphincter III/V  Pelvic floor musculature: Right levator non-tender, Right obturator non-tender, Left levator non-tender, Left obturator non-tender  POP-Q:   POP-Q  -1                                            Aa   -1  Ba  -9                                              C   2                                             Gh  4                                            Pb  10                                            tvl   -3                                            Ap  -3                                            Bp                                                 D     Rectal Exam:  Normal sphincter tone, no distal rectocele, enterocoele not present, no rectal masses, no sign of dyssynergia when asking the patient to bear down.  Post-Void Residual (PVR) by Bladder Scan: In order to evaluate bladder emptying, we discussed obtaining a postvoid residual and she agreed to this procedure.  Procedure: The ultrasound unit was placed on the patient's abdomen in the suprapubic region after the patient had voided. A PVR of 50 ml was obtained by bladder scan.  Laboratory Results: POC urine: negative   ASSESSMENT AND PLAN Ms. Wahl is a 75 y.o. with:  1. SUI (stress urinary incontinence, female)   2. Urinary urgency   3. Incontinence of feces, unspecified fecal incontinence type   4. Prolapse of anterior vaginal wall    SUI For treatment of stress urinary incontinence,  non-surgical options include expectant management, weight loss, physical therapy, as well as a pessary.  We did not review surgical options.  - She would like to start physical therapy, referral placed.   2. Accidental Bowel Leakage:  - Treatment options include anti-diarrhea medication (loperamide/ Imodium OTC or prescription lomotil), fiber supplements, physical therapy, and possible sacral neuromodulation or surgery.   - She will start with fiber supplementation- psyllium and physical therapy  3. Stage II anterior, Stage I posterior, Stage I apical prolapse - not symptomatic, no intervention needed at this time.   Follow up 3 months  Jaquita Folds, MD   Medical Decision Making:  - Reviewed/ ordered a clinical laboratory test - Review and summation of prior  records

## 2021-04-24 ENCOUNTER — Encounter: Payer: Self-pay | Admitting: Physical Therapy

## 2021-04-24 ENCOUNTER — Other Ambulatory Visit: Payer: Self-pay

## 2021-04-24 ENCOUNTER — Ambulatory Visit: Payer: Medicare Other | Attending: Obstetrics and Gynecology | Admitting: Physical Therapy

## 2021-04-24 DIAGNOSIS — M25651 Stiffness of right hip, not elsewhere classified: Secondary | ICD-10-CM

## 2021-04-24 DIAGNOSIS — R159 Full incontinence of feces: Secondary | ICD-10-CM

## 2021-04-24 DIAGNOSIS — M25652 Stiffness of left hip, not elsewhere classified: Secondary | ICD-10-CM

## 2021-04-24 DIAGNOSIS — N393 Stress incontinence (female) (male): Secondary | ICD-10-CM | POA: Diagnosis not present

## 2021-04-24 DIAGNOSIS — R278 Other lack of coordination: Secondary | ICD-10-CM | POA: Diagnosis not present

## 2021-04-24 DIAGNOSIS — M6281 Muscle weakness (generalized): Secondary | ICD-10-CM

## 2021-04-24 NOTE — Therapy (Signed)
[facility] [address], [place], [place], [address] Phone: [phone]   Fax:  [phone]  Physical Therapy Evaluation  Patient Details  Name: Shelly Johnson MRN: [id number] Date of Birth: 19-May-1946 Referring Provider (PT): Dr. Sherlene Shams   Encounter Date: 04/24/2021   PT End of Session - 04/24/21 1143     Visit Number 1    Date for PT Re-Evaluation 07/17/21    Authorization Type medicare    Authorization - Visit Number 1    Authorization - Number of Visits 10    PT Start Time 1100    PT Stop Time 1135    PT Time Calculation (min) 35 min    Activity Tolerance Patient tolerated treatment well    Behavior During Therapy WFL for tasks assessed/performed             Past Medical History:  Diagnosis Date   Anemia    Anxiety and depression    during loss of husband   Arthritis    Diverticulosis    GERD (gastroesophageal reflux disease)    GI bleed    hx of; hgb as low as 5.9   Hemorrhoids    Hiatal hernia    Obesity    Pneumonia    Urinary tract bacterial infections     Past Surgical History:  Procedure Laterality Date   ABDOMINAL HYSTERECTOMY     states had a "few cancer cells" but they "got everything". ? ovaries and cervix.    APPENDECTOMY     HIATAL HERNIA REPAIR      There were no vitals filed for this visit.    Subjective Assessment - 04/24/21 1103     Subjective 08/06/2020 started to have urinary leakage with sneezing and coughing, laughing, Has stopped exercise, gained weight and injured her knee. Once i nawhile will see stool on the anus.    Patient Stated Goals reduce leakage    Currently in Pain? No/denies    Multiple Pain Sites No                [facility] PT Assessment - 04/24/21 0001       Assessment   Medical Diagnosis N39.3 SUI ( stress urinary incontinence, female); R15.9 Incontinence of feces, unspedified fecal incontinence type    Referring Provider (PT)  Dr. Sherlene Shams    Onset Date/Surgical Date 08/06/21    Prior Therapy none      Precautions   Precautions Other (comment)    Precaution Comments cancer      Restrictions   Weight Bearing Restrictions No      Balance Screen   Has the patient fallen in the past 6 months No    Has the patient had a decrease in activity level because of a fear of falling?  No    Is the patient reluctant to leave their home because of a fear of falling?  No      Home Ecologist residence      Prior Function   Level of Independence Independent    Vocation Retired      Associate Professor   Overall Cognitive Status Within Functional Limits for tasks assessed      Observation/Other Assessments   Skin Integrity abdominal scars are restricted      Posture/Postural Control   Posture/Postural Control No significant limitations      ROM / Strength   AROM / PROM / Strength AROM;PROM;Strength  AROM   Lumbar Extension decreased by 75%    Lumbar - Right Side Bend decreased by 25%    Lumbar - Left Side Bend decreased by 25%    Lumbar - Right Rotation decreased by 25%    Lumbar - Left Rotation decreased by 25%      PROM   Right Hip Extension 0    Right Hip Flexion 100    Right Hip External Rotation  35    Right Hip Internal Rotation  20    Left Hip Extension -5    Left Hip Flexion 110    Left Hip External Rotation  40    Left Hip Internal Rotation  10      Strength   Overall Strength Comments when tighted the abdomen the rib cage elevates    Right Hip Extension 3-/5    Right Hip ABduction 3/5    Left Hip Extension 3-/5    Left Hip ABduction 3/5                        Objective measurements completed on examination: See above findings.     Pelvic Floor Special Questions - 04/24/21 0001     Prior Pregnancies Yes    Number of Pregnancies 2    Number of Vaginal Deliveries 2    Any difficulty with labor and deliveries No    Urinary Leakage  Yes    Pad use rolled up toilet paper    Activities that cause leaking Coughing;Sneezing;Laughing    Fecal incontinence Yes   notinces on the pad; 1x every few months; if pass gas has to rush to the bathroom   Fluid intake coffee, tea, cranberry juice    Exam Type Deferred   not comfortable with therapist doing internal pelvic assessment                   Upper Extremity Functional Index Score:  /80   PT Education - 04/24/21 1528     Education Details education on cystocele; education on bladder irritants and the purpose to reduce them to reduce the urinary leakage.    Person(s) Educated Patient    Methods Explanation;Handout    Comprehension Verbalized understanding              PT Short Term Goals - 04/24/21 1543       PT SHORT TERM GOAL #1   Title independent with initial HEP for hip stretches and abdominal scar massage    Time 4    Period Weeks    Status New    Target Date 05/22/21      PT SHORT TERM GOAL #2   Title understand bladder irritants and how it affects the bladder and can increase urinary leakage    Time 4    Period Weeks    Status New    Target Date 05/22/21               PT Long Term Goals - 04/24/21 1545       PT LONG TERM GOAL #1   Title Patient independent with advanced HEP for pelvic floor, core and hip strengthening    Time 12    Period Weeks    Status New    Target Date 07/17/21      PT LONG TERM GOAL #2   Title urinary leakage decreased by 75% due to improved lengthening and contracting of the pelvic floor at the correct time  Time 12    Period Weeks    Status New    Target Date 07/17/21      PT LONG TERM GOAL #3   Title bilateral hip P/ROM increased by 10 degrees for external rotation, 5 degrees for internal rotation and flexion to assist in elongation of the pelvic floor muscles    Time 12    Period Weeks    Status New    Target Date 07/17/21      PT LONG TERM GOAL #4   Title fecal leakage improved >/= 75%  due to improved coordination of the pelvic floor muscles and increased activity    Time 12    Period Weeks    Status New    Target Date 07/17/21                    Plan - 04/24/21 1144     Clinical Impression Statement Patient is a 75 year old female with urinary and fecal leakage since 12.20/2021 when she had increased in stresses in her life. Patient will leak urine with laughing, coughing, and sneezing. She will leak stool every few months. Patient will roll up toilet paper to use in her underwear. Patient has decreased bilateral hip ROM bilaterally. She has weaknes in bilateral hips. Patient has decreased mobility of abdominal scars. She will lift her lower rib cage when trying to contract her lower abdominals. Patient has gained weight and stopped exercising since last December. Patient drinks coffee and tea all day with no water. Patient was educated on bladder irritants today. Patient deferred pelvic floor strength assessment. Patient will benefit from skilled therapy to reduce her urinary and fecal leakage and improve strength and hip ROM.    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; Diverticulosis    Examination-Activity Limitations Continence;Toileting;Other   coughing, laughing, sneezing   Examination-Participation Restrictions Community Activity    Stability/Clinical Decision Making Evolving/Moderate complexity    Clinical Decision Making Low    Rehab Potential Excellent    PT Frequency 1x / week    PT Duration 12 weeks    PT Treatment/Interventions Biofeedback;Functional mobility training;Therapeutic activities;Therapeutic exercise;Neuromuscular re-education;Patient/family education;Manual techniques;Passive range of motion;Dry needling;Spinal Manipulations;Joint Manipulations    PT Next Visit Plan hip mobilization; hip stretches;  mobilization of scars; diaphragmatic breathing    Consulted and Agree with Plan of Care Patient             Patient will benefit from skilled therapeutic intervention in order to improve the following deficits and impairments:  Decreased coordination, Decreased range of motion, Increased fascial restricitons, Decreased endurance, Increased muscle spasms, Decreased strength, Decreased mobility, Decreased activity tolerance  Visit Diagnosis: Muscle weakness (generalized) - Plan: PT plan of care cert/re-cert  Other lack of coordination - Plan: PT plan of care cert/re-cert  Stiffness of left hip, not elsewhere classified - Plan: PT plan of care cert/re-cert  Stiffness of right hip, not elsewhere classified - Plan: PT plan of care cert/re-cert  Stress incontinence (female) (female) - Plan: PT plan of care cert/re-cert  Incontinence of feces, unspecified fecal incontinence type - Plan: PT plan of care cert/re-cert     Problem List Patient Active Problem List   Diagnosis Date Noted   Hyperglycemia 01/27/2020   Senile purpura ([place]) 01/27/2020   Hyperlipidemia, unspecified 09/14/2019   DDD (degenerative disc disease), lumbar 11/16/2018   Morbid obesity ([place]) 04/28/2018   Numbness  and tingling of both feet 01/21/2018   Depression, major, single episode, moderate ([place]) 06/20/2017   Essential hypertension 08/23/2016   Dyspnea on exertion 08/23/2016   ANEMIA-NOS 03/16/2008   Hiatal hernia with gastroesophageal reflux 03/08/2008    Earlie Counts, PT 04/24/21 5:14 PM' [facility] [address], [place], [place], [address] Phone: [phone]   Fax:  [phone]  Name: Shelly Johnson MRN: [id number] Date of Birth: Jun 10, 1946

## 2021-04-24 NOTE — Patient Instructions (Addendum)
About Cystocele Overview The pelvic organs, including the bladder, are normally supported by pelvic floor muscles and ligaments.   When these muscles and ligaments are stretched, weakened or torn, the wall between the bladder and the vagina sags or herniates causing a prolapse, sometimes called a cystocele. This condition may cause discomfort and problems with emptying the bladder.  It can be present in various stages.  Some people are not aware of the changes. Others may notice changes at the vaginal opening or a feeling of the bladder dropping outside the body. Causes of a Cystocele A cystocele is usually caused by muscle straining or stretching during childbirth.  In addition, cystocele is more common after menopause, because the hormone estrogen helps keep the elastic tissues around the pelvic organs strong.  A cystocele is more likely to occur when levels of estrogen decrease. Other causes include: heavy lifting, chronic coughing, previous pelvic surgery and obesity.  Symptoms A bladder that has dropped from its normal position may cause: unwanted urine leakage (stress incontinence), frequent urination or urge to urinate, incomplete emptying of the bladder (not feeling bladder relief after emptying), pain or discomfort in the vagina, pelvis, groin, lower back or lower abdomen and frequent urinary tract infections.  Mild cases may not cause any symptoms.  Treatment Options Pelvic floor (Kegel) exercises: Strength training the muscles in your genital area  Behavioral changes: Treating and preventing constipation, taking time to empty your bladder properly, learning to lift properly and/or  avoid heavy lifting when possible, stopping smoking, avoiding weight gain and treating a chronic cough or bronchitis. A pessary: A vaginal support device is sometimes used to help pelvic support caused by muscle and ligament changes. Surgery: Aurgical repair may be necessary if symptoms cannot be managed with  exercise, behavioral changes and a pessary. Surgery is usually considered for severe cases.  About Pelvic Support Problems Pelvic Support Problems Explained Ligaments, muscles, and connective tissue normally hold your bladder, uterus, and other organs in their proper places in your pelvis. When these tissues become weak, a problem with pelvic support may result. Weak support can cause one or more of the pelvic organs to drop down into the vagina. An organ may even drop so far that is partially exposed outside the body.  Pelvic support problems are named by the change in the organ. The main types of pelvic support problems are:  Cystocele: When the bladder drops down into your vagina.  Enterocele: When your small intestine drops between your vagina and rectum.  Rectocele: When your rectum bulges into the vaginal wall.  Uterine prolapse: When your uterus drops into your vagina.  Vaginal prolapse: When the top part of the vagina begins to droop. This sometimes happens after a hysterectomy (removal of the uterus).  Causes Pelvic support problems can be caused by many conditions. They may begin after you give birth, especially if you had a large baby. During childbirth, the muscles and skin of the birth canal (vagina) are stretched and sometimes torn. They heal over time but are not always exactly the same. A long pushing stage of labor may also weaken these tissues as well as very rapid births as the tissues do not have time to stretch so they tear.  Also, after menopause, there are changes in the vaginal walls resulting from a decrease in estrogen. Estrogen helps to keep the tissues toned. Low levels of estrogen weaken the vaginal walls and may cause the bladder to shift from its normal position. As women get   older, the loss of muscle tone and the relaxation of muscles may cause the uterus or other organs to drop.  Over time, conditions like chronic coughing, chronic constipation, doing a lot of heavy  lifting, straining to pass stool, and obesity, can also weaken the pelvic support muscles.  Diagnosing Pelvic Support Problems Your health care provider will ask about your symptoms and do a pelvic examination. Your provider may also do a rectal exam during your pelvic exam. Your provider may ask you to: 1. Bear down and push (like you are having a bowel movement) so he or she can see if your bladder or other part of your body protrudes into the vagina. 2. Contract the muscles of your pelvis to check the strength of your pelvic muscles.  3. Do several types of urine, nerve and muscle tests of the pelvis and around the bladder to see what type of treatment is best for you.   Symptoms Symptoms of pelvic support problems depend on the organ involved, but may include:  urine leakage  stain or fecal loss after a bowel movement trouble having bowel movements  ache in the lower abdomen, groin, or lower back  bladder infection  a feeling of heaviness, pulling, or fullness in the pelvis, or a feeling that something is falling out of the vagina  an organ protruding from your vaginal opening  feeling the need to support the organs or perineal area to empty bladder or bowels painful sexual intercourse.  Many women feel pelvic pressure or trouble holding their urine immediately after childbirth. For some, these symptoms go away permanently, in others they return as they get older.  Treatment Options A prolapsed organ cannot repair itself. Contact your health care provider as soon as you notice symptoms of a problem. Treatment depends on what the specific problem is and how far advanced it is.  The symptoms caused by some pelvic support problems may simply be treated with changes in diet, medicine to soften the stool, weight loss, or avoiding strenuous activities. You may also do pelvic floor exercises to help strengthen your pelvic muscles.  Some cases of prolapse may require a special support device made  from plastic or rubber called a pessary that fits into the vagina to support the uterus, vagina, or bladder. A pessary can also help women who leak urine when coughing, straining, or exercising. In mild cases, a tampon or vaginal diaphragm may be used instead of a pessary.  Talk to your doctor or health care provider about these options. In serious cases, surgery may be needed to put the organs back into their proper place. The uterus may be removed because of the pressure it puts on the bladder.  Your doctor will know what surgery will be best for you. How can I prevent pelvic support problems?  You can help prevent pelvic support problems by:  maintaining a healthy lifestyle  continuing to do pelvic floor exercises after you deliver a baby  maintaining a healthy weight  avoiding a lot of heavy lifting and lifting with your legs (not from your waist)  treating constipation and avoid getting   Bladder Irritants  Certain foods and beverages can be irritating to the bladder.  Avoiding these irritants may decrease your symptoms of urinary urgency, frequency or bladder pain.  Even reducing your intake can help with your symptoms.  Not everyone is sensitive to all bladder irritants, so you may consider focusing on one irritant at a time, removing or reducing your  intake of that irritant for 7-10 days to see if this change helps your symptoms.  Water intake is also very important.  Below is a list of bladder irritants.  Drinks: alcohol, carbonated beverages, caffeinated beverages such as coffee and tea, drinks with artificial sweeteners, citrus juices, apple juice, tomato juice  Foods: tomatoes and tomato based foods, spicy food, sugar and artificial sweeteners, vinegar, chocolate, raw onion, apples, citrus fruits, pineapple, cranberries, tomatoes, strawberries, plums, peaches, cantaloupe  Other: acidic urine (too concentrated) - see water intake info below  Substitutes you can try that are NOT  irritating to the bladder: cooked onion, pears, papayas, sun-brewed decaf teas, watermelons, non-citrus herbal teas, apricots, kava and low-acid instant drinks (Postum).    WATER INTAKE: Remember to drink lots of water (aim for fluid intake of half your body weight with 2/3 of fluids being water).  You may be limiting fluids due to fear of leakage, but this can actually worsen urgency symptoms due to highly concentrated urine.  Water helps balance the pH of your urine so it doesn't become too acidic - acidic urine is a bladder irritant!   8 ounces of water , 8 times per day [facility] [address], [place], [place] [address] Phone # [phone] Fax [phone]

## 2021-04-25 ENCOUNTER — Ambulatory Visit
Admission: RE | Admit: 2021-04-25 | Discharge: 2021-04-25 | Disposition: A | Discharge status: Home / Self Care | Payer: Medicare Other | Source: Ambulatory Visit | Attending: Family Medicine | Admitting: Family Medicine

## 2021-04-25 DIAGNOSIS — Z1231 Encounter for screening mammogram for malignant neoplasm of breast: Secondary | ICD-10-CM

## 2021-04-30 ENCOUNTER — Other Ambulatory Visit: Payer: Self-pay

## 2021-04-30 ENCOUNTER — Ambulatory Visit: Payer: Medicare Other | Admitting: Family Medicine

## 2021-04-30 ENCOUNTER — Encounter: Payer: Medicare Other | Attending: Family Medicine | Admitting: Physical Therapy

## 2021-04-30 ENCOUNTER — Encounter: Payer: Self-pay | Admitting: Physical Therapy

## 2021-04-30 VITALS — BP 163/83 | HR 83

## 2021-04-30 DIAGNOSIS — N393 Stress incontinence (female) (male): Secondary | ICD-10-CM | POA: Diagnosis not present

## 2021-04-30 DIAGNOSIS — M25652 Stiffness of left hip, not elsewhere classified: Secondary | ICD-10-CM | POA: Insufficient documentation

## 2021-04-30 DIAGNOSIS — R159 Full incontinence of feces: Secondary | ICD-10-CM | POA: Insufficient documentation

## 2021-04-30 DIAGNOSIS — R278 Other lack of coordination: Secondary | ICD-10-CM | POA: Diagnosis not present

## 2021-04-30 DIAGNOSIS — M6281 Muscle weakness (generalized): Secondary | ICD-10-CM | POA: Insufficient documentation

## 2021-04-30 DIAGNOSIS — M25651 Stiffness of right hip, not elsewhere classified: Secondary | ICD-10-CM | POA: Insufficient documentation

## 2021-04-30 NOTE — Patient Instructions (Signed)
Access Code: [email or web address] URL: [email or web address] Date: 04/30/2021 Prepared by: Earlie Counts  Exercises Supine Figure 4 Piriformis Stretch - 1 x daily - 7 x weekly - 1 sets - 2 reps - 30 sec hold Supine Hamstring Stretch - 1 x daily - 7 x weekly - 1 sets - 2 reps - 30 sec hold Sidelying Reverse Clamshell - 1 x daily - 7 x weekly - 1 sets - 10 reps Seated Pelvic Floor Contraction with Isometric Hip Adduction - 3 x daily - 7 x weekly - 1 sets - 10 reps - 5 sec hold Sit to Stand with Pelvic Floor Contraction - 1 x daily - 7 x weekly - 1 sets - 10 reps  Patient Education walking program  Earlie Counts, PT [facility] [address] [place], [place] [address] W: [phone] [email or web address]

## 2021-04-30 NOTE — Therapy (Signed)
[place] [facility] [address], [place], [place], [address] Phone: [phone]   Fax:  [phone]  Physical Therapy Treatment  Patient Details  Name: Shelly Johnson MRN: [id number] Date of Birth: 03-Jan-1946 Referring Provider (PT): Dr. Sherlene Shams   Encounter Date: 04/30/2021   PT End of Session - 04/30/21 1556     Visit Number 2    Date for PT Re-Evaluation 07/17/21    Authorization Type medicare    Authorization - Visit Number 2    Authorization - Number of Visits 10    PT Start Time 1500    PT Stop Time 1545    PT Time Calculation (min) 45 min    Activity Tolerance Patient tolerated treatment well;Other (comment)   patient felt dizzy when getting up from supine to sit and was breathing hard during treatment   Behavior During Therapy [facility] for tasks assessed/performed             Past Medical History:  Diagnosis Date   Anemia    Anxiety and depression    during loss of husband   Arthritis    Diverticulosis    GERD (gastroesophageal reflux disease)    GI bleed    hx of; hgb as low as 5.9   Hemorrhoids    Hiatal hernia    Obesity    Pneumonia    Urinary tract bacterial infections     Past Surgical History:  Procedure Laterality Date   ABDOMINAL HYSTERECTOMY     states had a "few cancer cells" but they "got everything". ? ovaries and cervix.    APPENDECTOMY     HIATAL HERNIA REPAIR      Vitals:   04/30/21 1553  BP: (!) 163/83  Pulse: 83     Subjective Assessment - 04/30/21 1508     Subjective I felt fine after last visit. My ankle is swollen. I have cut out the tea I ma drinking.    Patient Stated Goals reduce leakage    Currently in Pain? No/denies    Multiple Pain Sites No                [facility] PT Assessment - 04/30/21 0001       PROM   Right Hip Flexion 120    Right Hip External Rotation  40    Left Hip Flexion 120    Left Hip External Rotation  40                            [facility] Adult PT Treatment/Exercise - 04/30/21 0001       Self-Care   Self-Care Other Self-Care Comments    Other Self-Care Comments  reviewed bladder irritants and she has cut down her tea comsumption      Lumbar Exercises: Stretches   Active Hamstring Stretch Right;Left;1 rep;30 seconds    Active Hamstring Stretch Limitations supine    Single Knee to Chest Stretch Right;Left;5 reps    Single Knee to Chest Stretch Limitations active assistive 10x each leg    Hip Flexor Stretch Right;Left;1 rep;30 seconds    Hip Flexor Stretch Limitations with assistance    Piriformis Stretch Right;Left;1 rep;30 seconds    Piriformis Stretch Limitations supine wiht push knee    Other Lumbar Stretch Exercise sidely reverse clam 10x each side    Other Lumbar Stretch Exercise contract relax for hip external rotation and hip abduction to improve  ROM;      Lumbar Exercises: Seated   Other Seated Lumbar Exercises seated ball squeeze with pelvic floor contraction holding for 5 sec 10x    Other Seated Lumbar Exercises sit to stand 10x with pelvic floor contraction and squeeze her gluteals 10x      Manual Therapy   Manual Therapy Joint mobilization    Joint Mobilization distraction, inferior glide, anterior and posterior glide to bilateral hips to improve mobility                     PT Education - 04/30/21 1555     Education Details Access Code: [email or web address]  ; instructed patient to take her blood pressure when she gets home and is not feeling well call the doctor tomorrow    Person(s) Educated Patient    Methods Explanation;Demonstration;Handout    Comprehension Returned demonstration;Verbalized understanding              PT Short Term Goals - 04/24/21 1543       PT SHORT TERM GOAL #1   Title independent with initial HEP for hip stretches and abdominal scar massage    Time 4    Period Weeks    Status New    Target Date 05/22/21      PT SHORT TERM GOAL  #2   Title understand bladder irritants and how it affects the bladder and can increase urinary leakage    Time 4    Period Weeks    Status New    Target Date 05/22/21               PT Long Term Goals - 04/24/21 1545       PT LONG TERM GOAL #1   Title Patient independent with advanced HEP for pelvic floor, core and hip strengthening    Time 12    Period Weeks    Status New    Target Date 07/17/21      PT LONG TERM GOAL #2   Title urinary leakage decreased by 75% due to improved lengthening and contracting of the pelvic floor at the correct time    Time 12    Period Weeks    Status New    Target Date 07/17/21      PT LONG TERM GOAL #3   Title bilateral hip P/ROM increased by 10 degrees for external rotation, 5 degrees for internal rotation and flexion to assist in elongation of the pelvic floor muscles    Time 12    Period Weeks    Status New    Target Date 07/17/21      PT LONG TERM GOAL #4   Title fecal leakage improved >/= 75% due to improved coordination of the pelvic floor muscles and increased activity    Time 12    Period Weeks    Status New    Target Date 07/17/21                   Plan - 04/30/21 1557     Clinical Impression Statement Patient had increased hip P/ROM after the manual mobilizations and understands some stretches to do. Patient has learned some exericses to strengthen the pelvic floor. She was dizzy going from supine to sitting so we did not push the patient today. Patient has not met goals yet due to just starting therapy. Patient has cut down her tea consumption. Patient will benefit from skilled therapy to reduce her urinary and fecal leakage  and improve strength and hip ROM.    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; Diverticulosis    Examination-Activity Limitations Continence;Toileting;Other    coughing, sneezing and laughing   Examination-Participation Restrictions Community Activity    Stability/Clinical Decision Making Evolving/Moderate complexity    Rehab Potential Excellent    PT Frequency 1x / week    PT Duration 12 weeks    PT Treatment/Interventions Biofeedback;Functional mobility training;Therapeutic activities;Therapeutic exercise;Neuromuscular re-education;Patient/family education;Manual techniques;Passive range of motion;Dry needling;Spinal Manipulations;Joint Manipulations    PT Next Visit Plan hip mobilization; hip stretches; mobilization of scars; diaphragmatic breathing; see how the leakage is going, add quick flicks    PT Home Exercise Plan Access Code: [email or web address]    Recommended Other Services MD signed initial eval    Consulted and Agree with Plan of Care Patient             Patient will benefit from skilled therapeutic intervention in order to improve the following deficits and impairments:  Decreased coordination, Decreased range of motion, Increased fascial restricitons, Decreased endurance, Increased muscle spasms, Decreased strength, Decreased mobility, Decreased activity tolerance  Visit Diagnosis: Muscle weakness (generalized)  Other lack of coordination  Stiffness of left hip, not elsewhere classified  Stiffness of right hip, not elsewhere classified  Stress incontinence (female) (female)  Incontinence of feces, unspecified fecal incontinence type     Problem List Patient Active Problem List   Diagnosis Date Noted   Hyperglycemia 01/27/2020   Senile purpura ([place]) 01/27/2020   Hyperlipidemia, unspecified 09/14/2019   DDD (degenerative disc disease), lumbar 11/16/2018   Morbid obesity ([place]) 04/28/2018   Numbness and tingling of both feet 01/21/2018   Depression, major, single episode, moderate ([place]) 06/20/2017   Essential hypertension 08/23/2016   Dyspnea on exertion 08/23/2016   ANEMIA-NOS 03/16/2008   Hiatal hernia with gastroesophageal  reflux 03/08/2008    Earlie Counts, PT 04/30/21 4:03 PM  [facility] [address] [place], [place], [address] Phone: [phone]   Fax:  [phone]  Name: Wynonna Fitzhenry MRN: [id number] Date of Birth: 04-28-1946

## 2021-05-07 ENCOUNTER — Encounter: Payer: Medicare Other | Admitting: Physical Therapy

## 2021-05-07 ENCOUNTER — Encounter: Payer: Self-pay | Admitting: Physical Therapy

## 2021-05-07 ENCOUNTER — Other Ambulatory Visit: Payer: Self-pay

## 2021-05-07 DIAGNOSIS — R278 Other lack of coordination: Secondary | ICD-10-CM | POA: Diagnosis not present

## 2021-05-07 DIAGNOSIS — M25651 Stiffness of right hip, not elsewhere classified: Secondary | ICD-10-CM | POA: Diagnosis not present

## 2021-05-07 DIAGNOSIS — R159 Full incontinence of feces: Secondary | ICD-10-CM | POA: Diagnosis not present

## 2021-05-07 DIAGNOSIS — M25652 Stiffness of left hip, not elsewhere classified: Secondary | ICD-10-CM | POA: Diagnosis not present

## 2021-05-07 DIAGNOSIS — M6281 Muscle weakness (generalized): Secondary | ICD-10-CM | POA: Diagnosis not present

## 2021-05-07 DIAGNOSIS — N393 Stress incontinence (female) (male): Secondary | ICD-10-CM | POA: Diagnosis not present

## 2021-05-07 NOTE — Patient Instructions (Signed)
Access Code: [email or web address] URL: [email or web address] Date: 05/07/2021 Prepared by: Earlie Counts  Exercises Seated Pelvic Floor Contraction with Isometric Hip Adduction - 3 x daily - 7 x weekly - 1 sets - 10 reps - 5 sec hold Sit to Stand with Pelvic Floor Contraction - 1 x daily - 7 x weekly - 1 sets - 10 reps Seated Exhale with Pelvic Floor Contraction and Med Ball Lift - 1 x daily - 7 x weekly - 2 sets - 10 reps Seated Hamstring Stretch - 1 x daily - 7 x weekly - 1 sets - 2 reps - 30 sec hold Supine Abdominal Wall Massage - 1 x daily - 7 x weekly - 1 sets - 10 reps Standing Hip Extension with Counter Support - 1 x daily - 7 x weekly - 2 sets - 10 reps  Patient Education walking program Earlie Counts, PT [facility] [address] [place], [place] [address] W: [phone] [email or web address]

## 2021-05-07 NOTE — Therapy (Signed)
[place] [facility] [address] [place], [place], [address] Phone: [phone]   Fax:  [phone]  Physical Therapy Treatment  Patient Details  Name: Shelly Johnson MRN: [id number] Date of Birth: 1946/08/18 Referring Provider (PT): Dr. Sherlene Shams   Encounter Date: 05/07/2021   PT End of Session - 05/07/21 1548     Visit Number 3    Date for PT Re-Evaluation 07/17/21    Authorization Type medicare    Authorization - Visit Number 3    Authorization - Number of Visits 10    PT Start Time 1500    PT Stop Time 1540    PT Time Calculation (min) 40 min    Activity Tolerance Patient tolerated treatment well    Behavior During Therapy WFL for tasks assessed/performed             Past Medical History:  Diagnosis Date   Anemia    Anxiety and depression    during loss of husband   Arthritis    Diverticulosis    GERD (gastroesophageal reflux disease)    GI bleed    hx of; hgb as low as 5.9   Hemorrhoids    Hiatal hernia    Obesity    Pneumonia    Urinary tract bacterial infections     Past Surgical History:  Procedure Laterality Date   ABDOMINAL HYSTERECTOMY     states had a "few cancer cells" but they "got everything". ? ovaries and cervix.    APPENDECTOMY     HIATAL HERNIA REPAIR      There were no vitals filed for this visit.   Subjective Assessment - 05/07/21 1452     Subjective When I got home my blood pressure was good but felt funny for several hours. My blood pressure is fine today. The leakage is doint better. I have reduced my tea. I have been increasing my walking.    Patient Stated Goals reduce leakage    Currently in Pain? No/denies                               [facility] Adult PT Treatment/Exercise - 05/07/21 0001       Lumbar Exercises: Stretches   Active Hamstring Stretch Right;Left;1 rep;30 seconds    Active Hamstring Stretch Limitations supine    Single  Knee to Chest Stretch Right;Left;5 reps    Single Knee to Chest Stretch Limitations active assistive 10x each leg    Piriformis Stretch Right;Left;1 rep;30 seconds    Piriformis Stretch Limitations supine with assistance    Other Lumbar Stretch Exercise hip adductors with assistance      Lumbar Exercises: Seated   Other Seated Lumbar Exercises seated lift 5 pounds with pelvic floor contraction with breathing out and in and pelvic floor contraction, patient had difficulty wiht contracting the pelvic floor when lifting up the weight      Knee/Hip Exercises: Standing   Hip Extension Stengthening;Right;Left;1 set;10 reps;Knee straight    Extension Limitations with pelvic floor contraction, VC to keep pelvis parallel with counter and keep leg extended      Manual Therapy   Manual Therapy Soft tissue mobilization;Myofascial release    Manual therapy comments educated pateitn on how to perform abdominal massage and educated patient on scar massage    Soft tissue mobilization scar massage to the abdomen, circular massage to abdomen to imporve peristalic motion  Myofascial Release fascial release of the abdoment to release the midline and suprapubically                     PT Education - 05/07/21 1548     Education Details Access Code: [email or web address]; scar massage and abdominal massage    Person(s) Educated Patient    Methods Explanation;Demonstration;Verbal cues;Handout    Comprehension Returned demonstration;Verbalized understanding              PT Short Term Goals - 05/07/21 1554       PT SHORT TERM GOAL #1   Title independent with initial HEP for hip stretches and abdominal scar massage    Time 4    Period Weeks    Status Achieved      PT SHORT TERM GOAL #2   Title understand bladder irritants and how it affects the bladder and can increase urinary leakage    Time 4    Period Weeks    Status Achieved               PT Long Term Goals - 04/24/21 1545        PT LONG TERM GOAL #1   Title Patient independent with advanced HEP for pelvic floor, core and hip strengthening    Time 12    Period Weeks    Status New    Target Date 07/17/21      PT LONG TERM GOAL #2   Title urinary leakage decreased by 75% due to improved lengthening and contracting of the pelvic floor at the correct time    Time 12    Period Weeks    Status New    Target Date 07/17/21      PT LONG TERM GOAL #3   Title bilateral hip P/ROM increased by 10 degrees for external rotation, 5 degrees for internal rotation and flexion to assist in elongation of the pelvic floor muscles    Time 12    Period Weeks    Status New    Target Date 07/17/21      PT LONG TERM GOAL #4   Title fecal leakage improved >/= 75% due to improved coordination of the pelvic floor muscles and increased activity    Time 12    Period Weeks    Status New    Target Date 07/17/21                   Plan - 05/07/21 1549     Clinical Impression Statement Patient reports less urine and fecal leakage since last visit. She has increased abdominal tissue mobility to allow the bladder to fill and decreased pressure on the pelvic floor to cause leakage. Patient abdomen was softer after the manual work and not as bloated. Patient does not do well laying flat to exercise. She prefers exercises in sitting or standing. Patient felt lighheaded wiht laying flat but better when she was reclined. Patient needs verbal cues to keep pelvis parallell to the counter with hip extension. Patient is walking more. She is not drinking as much tea which is a bladder irritant. Patient will benefit from skilled therapy to reduce her urinary and fecal leakage and improve strength with hip ROM.    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; Diverticulosis    Examination-Activity Limitations  Continence;Toileting;Other   coughing, sneezing, laughing   Stability/Clinical Decision Making Evolving/Moderate complexity  Rehab Potential Excellent    PT Frequency 1x / week    PT Duration 12 weeks    PT Treatment/Interventions Biofeedback;Functional mobility training;Therapeutic activities;Therapeutic exercise;Neuromuscular re-education;Patient/family education;Manual techniques;Passive range of motion;Dry needling;Spinal Manipulations;Joint Manipulations    PT Next Visit Plan mobilization of scars; diaphragmatic breathing; see how the leakage is going, add quick flicks    PT Home Exercise Plan Access Code: [email or web address]    Consulted and Agree with Plan of Care Patient             Patient will benefit from skilled therapeutic intervention in order to improve the following deficits and impairments:  Decreased coordination, Decreased range of motion, Increased fascial restricitons, Decreased endurance, Increased muscle spasms, Decreased strength, Decreased mobility, Decreased activity tolerance  Visit Diagnosis: Muscle weakness (generalized)  Other lack of coordination  Stiffness of left hip, not elsewhere classified  Stiffness of right hip, not elsewhere classified  Stress incontinence (female) (female)  Incontinence of feces, unspecified fecal incontinence type     Problem List Patient Active Problem List   Diagnosis Date Noted   Hyperglycemia 01/27/2020   Senile purpura ([place]) 01/27/2020   Hyperlipidemia, unspecified 09/14/2019   DDD (degenerative disc disease), lumbar 11/16/2018   Morbid obesity ([place]) 04/28/2018   Numbness and tingling of both feet 01/21/2018   Depression, major, single episode, moderate ([place]) 06/20/2017   Essential hypertension 08/23/2016   Dyspnea on exertion 08/23/2016   ANEMIA-NOS 03/16/2008   Hiatal hernia with gastroesophageal reflux 03/08/2008    Earlie Counts, PT 05/07/21 3:55 PM  [facility] [address] [place], [place], [address] Phone: [phone]   Fax:  [phone]  Name: Shelly Johnson MRN: [id number] Date of Birth: 1946/05/13

## 2021-05-14 ENCOUNTER — Other Ambulatory Visit: Payer: Self-pay

## 2021-05-14 ENCOUNTER — Encounter: Payer: Medicare Other | Admitting: Physical Therapy

## 2021-05-14 ENCOUNTER — Encounter: Payer: Self-pay | Admitting: Physical Therapy

## 2021-05-14 DIAGNOSIS — N393 Stress incontinence (female) (male): Secondary | ICD-10-CM | POA: Diagnosis not present

## 2021-05-14 DIAGNOSIS — R159 Full incontinence of feces: Secondary | ICD-10-CM

## 2021-05-14 DIAGNOSIS — R278 Other lack of coordination: Secondary | ICD-10-CM | POA: Diagnosis not present

## 2021-05-14 DIAGNOSIS — M6281 Muscle weakness (generalized): Secondary | ICD-10-CM | POA: Diagnosis not present

## 2021-05-14 DIAGNOSIS — M25652 Stiffness of left hip, not elsewhere classified: Secondary | ICD-10-CM

## 2021-05-14 DIAGNOSIS — M25651 Stiffness of right hip, not elsewhere classified: Secondary | ICD-10-CM | POA: Diagnosis not present

## 2021-05-14 NOTE — Therapy (Signed)
[place] [facility] [address] [place], [place], [address] Phone: [phone]   Fax:  [phone]  Physical Therapy Treatment  Patient Details  Name: Shelly Johnson MRN: [id number] Date of Birth: Jun 29, 1946 Referring Provider (PT): Dr. Sherlene Shams   Encounter Date: 05/14/2021   PT End of Session - 05/14/21 1556     Visit Number 4    Date for PT Re-Evaluation 07/17/21    Authorization Type medicare    Authorization - Visit Number 4    Authorization - Number of Visits 10    PT Start Time 1500    PT Stop Time 1545    PT Time Calculation (min) 45 min    Activity Tolerance Patient tolerated treatment well    Behavior During Therapy WFL for tasks assessed/performed             Past Medical History:  Diagnosis Date   Anemia    Anxiety and depression    during loss of husband   Arthritis    Diverticulosis    GERD (gastroesophageal reflux disease)    GI bleed    hx of; hgb as low as 5.9   Hemorrhoids    Hiatal hernia    Obesity    Pneumonia    Urinary tract bacterial infections     Past Surgical History:  Procedure Laterality Date   ABDOMINAL HYSTERECTOMY     states had a "few cancer cells" but they "got everything". ? ovaries and cervix.    APPENDECTOMY     HIATAL HERNIA REPAIR      There were no vitals filed for this visit.   Subjective Assessment - 05/14/21 1507     Subjective I did my exercises.    Patient Stated Goals reduce leakage    Currently in Pain? No/denies    Multiple Pain Sites No                               [facility] Adult PT Treatment/Exercise - 05/14/21 0001       Neuro Re-ed    Neuro Re-ed Details  diaphramatic breathing with moving air from the upper abdomen to the lower abdomen to relax the pelvic floor for a better contraction; as patient breathes out the therapist will guide the rib cage downward for a bigger breth and assist the movement of the  diaphragm      Manual Therapy   Manual Therapy Soft tissue mobilization;Myofascial release    Soft tissue mobilization soft tissue work to the diaphragm, obliques, rectus, and lower rib intercoastals    Myofascial Release using a suction cup to the abdominal scars to rellease the scar tissue and fascia; mnaual work in the umbiliucus; release of the bladder and sac of douglas fasccia to assist with tissue mobility                     PT Education - 05/14/21 1552     Education Details Access Code: [email or web address]    Person(s) Educated Patient    Methods Explanation;Demonstration;Verbal cues;Handout    Comprehension Returned demonstration;Verbalized understanding              PT Short Term Goals - 05/07/21 1554       PT SHORT TERM GOAL #1   Title independent with initial HEP for hip stretches and abdominal scar massage    Time 4    Period Weeks  Status Achieved      PT SHORT TERM GOAL #2   Title understand bladder irritants and how it affects the bladder and can increase urinary leakage    Time 4    Period Weeks    Status Achieved               PT Long Term Goals - 05/14/21 1600       PT LONG TERM GOAL #1   Title Patient independent with advanced HEP for pelvic floor, core and hip strengthening    Time 12    Period Weeks    Status On-going      PT LONG TERM GOAL #2   Title urinary leakage decreased by 75% due to improved lengthening and contracting of the pelvic floor at the correct time    Baseline only 2 times since last visit    Time 12    Period Weeks    Status On-going      PT LONG TERM GOAL #3   Title bilateral hip P/ROM increased by 10 degrees for external rotation, 5 degrees for internal rotation and flexion to assist in elongation of the pelvic floor muscles    Time 12    Period Weeks    Status On-going      PT LONG TERM GOAL #4   Title fecal leakage improved >/= 75% due to improved coordination of the pelvic floor muscles and increased  activity    Baseline had trouble due to a bug this past week    Time 12    Period Weeks    Status On-going                   Plan - 05/14/21 1556     Clinical Impression Statement Patient has only had 2 times of leakag ewith urine since last visit. She had one time with fecal leakage but felt she ate something bad and casuse diarrhea. Patient able to expand her abdomen and feel it down  to her pelvic floor for first time. She has increased tightness  throughout the abdomen and lower rib cage making it difficult to get a full breath. Patient is walking more. Patient will benefit from skilled therapy to reduce her urinary and fecal leakage and to improve strength with hip ROM.    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; Diverticulosis    Examination-Activity Limitations Continence;Toileting;Other   cohging, sneezing, laughing   Examination-Participation Restrictions Community Activity    Stability/Clinical Decision Making Evolving/Moderate complexity    Rehab Potential Excellent    PT Frequency 1x / week    PT Duration 12 weeks    PT Treatment/Interventions Biofeedback;Functional mobility training;Therapeutic activities;Therapeutic exercise;Neuromuscular re-education;Patient/family education;Manual techniques;Passive range of motion;Dry needling;Spinal Manipulations;Joint Manipulations    PT Next Visit Plan mobilization of scars; diaphragmatic breathing; see how the leakage is going, add quick flicks, measure hip ROM, hip adduction strength    PT Home Exercise Plan Access Code: [email or web address]    Consulted and Agree with Plan of Care Patient             Patient will benefit from skilled therapeutic intervention in order to improve the following deficits and impairments:  Decreased coordination, Decreased range of motion, Increased fascial restricitons,  Decreased endurance, Increased muscle spasms, Decreased strength, Decreased mobility, Decreased activity tolerance  Visit Diagnosis: Muscle weakness (generalized)  Other lack of coordination  Stiffness of left  hip, not elsewhere classified  Stiffness of right hip, not elsewhere classified  Stress incontinence (female) (female)  Incontinence of feces, unspecified fecal incontinence type     Problem List Patient Active Problem List   Diagnosis Date Noted   Hyperglycemia 01/27/2020   Senile purpura ([place]) 01/27/2020   Hyperlipidemia, unspecified 09/14/2019   DDD (degenerative disc disease), lumbar 11/16/2018   Morbid obesity ([place]) 04/28/2018   Numbness and tingling of both feet 01/21/2018   Depression, major, single episode, moderate ([place]) 06/20/2017   Essential hypertension 08/23/2016   Dyspnea on exertion 08/23/2016   ANEMIA-NOS 03/16/2008   Hiatal hernia with gastroesophageal reflux 03/08/2008    Earlie Counts, PT 05/14/21 4:01 PM  [facility] [address] [place], [place], [address] Phone: [phone]   Fax:  [phone]  Name: Shelly Johnson MRN: [id number] Date of Birth: [birth year]

## 2021-05-14 NOTE — Patient Instructions (Signed)
Access Code: [email or web address] URL: [email or web address] Date: 05/14/2021 Prepared by: Earlie Counts  Exercises Seated Pelvic Floor Contraction with Isometric Hip Adduction - 3 x daily - 7 x weekly - 1 sets - 10 reps - 5 sec hold Sit to Stand with Pelvic Floor Contraction - 1 x daily - 7 x weekly - 1 sets - 10 reps Seated Exhale with Pelvic Floor Contraction and Med Ball Lift - 1 x daily - 7 x weekly - 2 sets - 10 reps Seated Hamstring Stretch - 1 x daily - 7 x weekly - 1 sets - 2 reps - 30 sec hold Supine Abdominal Wall Massage - 1 x daily - 7 x weekly - 1 sets - 10 reps Standing Hip Extension with Counter Support - 1 x daily - 7 x weekly - 2 sets - 10 reps Supine Diaphragmatic Breathing - 1 x daily - 7 x weekly - 1 sets - 10 reps  Patient Education walking program  Earlie Counts, PT [facility] [address] [place], [place] [address] W: [phone] [email or web address]

## 2021-05-22 ENCOUNTER — Encounter (INDEPENDENT_AMBULATORY_CARE_PROVIDER_SITE_OTHER): Payer: Self-pay

## 2021-05-30 ENCOUNTER — Encounter: Payer: Self-pay | Admitting: Family Medicine

## 2021-05-30 ENCOUNTER — Ambulatory Visit (INDEPENDENT_AMBULATORY_CARE_PROVIDER_SITE_OTHER): Payer: Medicare Other | Admitting: Family Medicine

## 2021-05-30 ENCOUNTER — Other Ambulatory Visit: Payer: Self-pay

## 2021-05-30 VITALS — BP 130/84 | HR 84 | Temp 97.9°F | Wt 219.8 lb

## 2021-05-30 DIAGNOSIS — F321 Major depressive disorder, single episode, moderate: Secondary | ICD-10-CM

## 2021-05-30 DIAGNOSIS — Z23 Encounter for immunization: Secondary | ICD-10-CM | POA: Diagnosis not present

## 2021-05-30 DIAGNOSIS — E785 Hyperlipidemia, unspecified: Secondary | ICD-10-CM

## 2021-05-30 DIAGNOSIS — R519 Headache, unspecified: Secondary | ICD-10-CM

## 2021-05-30 DIAGNOSIS — R739 Hyperglycemia, unspecified: Secondary | ICD-10-CM | POA: Diagnosis not present

## 2021-05-30 DIAGNOSIS — I10 Essential (primary) hypertension: Secondary | ICD-10-CM | POA: Diagnosis not present

## 2021-05-30 MED ORDER — CHLORTHALIDONE 25 MG PO TABS: 25.0000 mg | ORAL_TABLET | Freq: Every day | ORAL | 3 refills | Status: DC

## 2021-05-30 NOTE — Progress Notes (Signed)
Phone [phone] In person visit   Subjective:   Shelly Johnson is a 75 y.o. year old very pleasant female patient who presents for/with See problem oriented charting Chief Complaint  Patient presents with   Headache    ED 03/20/2021, thinks they may be stress related. Improving slightly    This visit occurred during the SARS-CoV-2 public health emergency.  Safety protocols were in place, including screening questions prior to the visit, additional usage of staff PPE, and extensive cleaning of exam room while observing appropriate contact time as indicated for disinfecting solutions.   Past Medical History-  Patient Active Problem List   Diagnosis Date Noted   Hyperglycemia 01/27/2020    Priority: 2.   Hyperlipidemia, unspecified 09/14/2019    Priority: 2.   Numbness and tingling of both feet 01/21/2018    Priority: 2.   Depression, major, single episode, moderate ([place]) 06/20/2017    Priority: 2.   Essential hypertension 08/23/2016    Priority: 2.   ANEMIA-NOS 03/16/2008    Priority: 2.   Morbid obesity ([place]) 04/28/2018    Priority: 3.   Hiatal hernia with gastroesophageal reflux 03/08/2008    Priority: 3.   Senile purpura ([place]) 01/27/2020   DDD (degenerative disc disease), lumbar 11/16/2018   Dyspnea on exertion 08/23/2016    Medications- reviewed and updated Current Outpatient Medications  Medication Sig Dispense Refill   ferrous sulfate 325 (65 FE) MG tablet Take 1 tablet (325 mg total) by mouth 2 (two) times daily. (Patient taking differently: Take 325 mg by mouth daily.) 60 tablet 3   naproxen sodium (ALEVE) 220 MG tablet Take 220 mg by mouth. As needed     omeprazole (PRILOSEC) 40 MG capsule TAKE ONE CAPSULE BY MOUTH DAILY 90 capsule 2   vitamin C (ASCORBIC ACID) 500 MG tablet Take 500 mg by mouth daily.       chlorthalidone (HYGROTON) 25 MG tablet Take 1 tablet (25 mg total) by mouth daily. 100 tablet 3   No current facility-administered medications for  this visit.     Objective:  BP 130/84   Pulse 84   Temp 97.9 F (36.6 C) (Temporal)   Wt 219 lb 12.8 oz (99.7 kg)   SpO2 96%   BMI 41.53 kg/m  Gen: NAD, resting comfortably CV: RRR no murmurs rubs or gallops Lungs: CTAB no crackles, wheeze, rhonchi Ext: trace edema Skin: warm, dry Neuro: CN II-XII intact, sensation and reflexes normal throughout, 5/5 muscle strength in bilateral upper and lower extremities. Normal finger to nose. Normal rapid alternating movements. No pronator drift. Normal romberg. Normal gait.     Assessment and Plan   #Headache S: Patient went to the emergency room on 03/20/2021 woke up with a headache behind her left eye and her left nose started running and noted some left jaw discomfort.  She felt somewhat disoriented and stressed.  By the time of visit headache was improving down to 5 out of 10.  She reports rarely has headaches in general.  Given new headache pattern patient over age 16 CT of the head was completed with no acute abnormalities found.  It was thought tension headaches are most likely diagnosis.  Was not thought to be temporal arteritis/giant cell arteritis.  Lab work was reassuring including urinalysis, troponin x2, CMP, CBC (other than slightly high hemoglobin which has been her baseline for the last few years)  Headaches 2-3 times a week mild overall- overall improved.  A/P: Traditionally for new headache pattern  over age 73 I would get an MRI-since she already had a CT of her head and we think her headaches could be related to stress and/or allergies we are going to see how she does with allergy treatment and ideally some stress reduction-gave information for referral to [facility] (she is not super interested right now) - I also wonder if could be getting some allergy related/sinus headaches- worth seeing if this helps as well -she agrees to call back if symptoms worsen or fail to improve over next 3-4 weeks  # Cough S:2-3 months  of mild cough.  DOE on list for last 5 years- not worsening. Multiple workups include CXR in past, CBC, d dimer, echo in 2018,  stress test with Dr. Burt Knack in 2018. Repeat CXR 03/20/21 when in ER and was having cough then - does have hiatal hernia which could contribute to SOB (does not want surgery) -does have some runny nose as well- likely has underlying allergies -sneezing at times with some incontinence -also with watery itchy eyes- particularly if outside more A/P: Cough could be related to postnasal drip and this could be caused by allergies.  Allergies could also cause runny nose, sneezing, watery itchy eyes.  I think it is reasonable to trial Allegra or Claritin or Zyrtec before bed.  She should avoid decongestants though.  She will let me know if fails to improve over the next 3 weeks- could add flonase ase well - omeprazole for reflux should reduce risk of chronic cough  #hypertension S: medication: chlorthalidone 25 mg BP Readings from Last 3 Encounters:  05/30/21 130/84  04/30/21 (!) 163/83  04/09/21 (!) 131/95  A/P: Controlled. Continue current medications.    #hyperlipidemia S: Medication:none  Lab Results  Component Value Date   CHOL 199 09/16/2019   HDL 53.00 09/16/2019   LDLCALC 106 (H) 07/18/2013   LDLDIRECT 113.0 09/16/2019   TRIG 319.0 (H) 09/16/2019   CHOLHDL 4 09/16/2019   A/P: offered updating lipids- she declines and would also decline meds for this if positive.     # Hyperglycemia/insulin resistance/prediabetes S:  Medication: none Exercise and diet- active at house. No regular walking. Weight down 1 lb from last visit here -she states walking less due to incontinence- UA was normal in ER Lab Results  Component Value Date   HGBA1C 5.7 (H) 06/04/2020   HGBA1C 6.5 01/31/2020   HGBA1C 6.1 09/16/2019  A/P: due for a1c but declines today- discussed working on [employer] eating/regular exercise/weight loss   # Depression S: Medication:none  -Multiple  stressors-stress since December 2021 when pipe burst in home.   Lost her last living brother in May of this year. Had lost a sibling a year for 3 years before that and is also losing friends . Starting to see light at end of the tunnel.  -still with lowe energy/excessive daytime sleepiness- she declines osa testing again Depression screen [facility] 2/9 01/04/2021 06/04/2020 01/27/2020  Decreased Interest 0 0 0  Down, Depressed, Hopeless 0 0 0  PHQ - 2 Score 0 0 0  Altered sleeping 0 0 0  Tired, decreased energy 0 0 3  Change in appetite 0 0 0  Feeling bad or failure about yourself  0 0 0  Trouble concentrating 0 0 3  Moving slowly or fidgety/restless 0 0 0  Suicidal thoughts 0 0 0  PHQ-9 Score 0 0 6  Difficult doing work/chores Not difficult at all - Not difficult at all  A/P: full remission but  is still having stressors which could contribute to her headaches  #Health maintenance- declines covid booster. Flu shot high dose today   Recommended follow up: No follow-ups on file. Future Appointments  Date Time Provider [place]  06/12/2021  3:30 PM Monico Hoar, PT [facility] None  06/25/2021  1:40 PM [name], MD [facility]  07/17/2021  1:00 PM Jaquita Folds, MD [facility]   Lab/Order associations: No diagnosis found.  Meds ordered this encounter  Medications   chlorthalidone (HYGROTON) 25 MG tablet    Sig: Take 1 tablet (25 mg total) by mouth daily.    Dispense:  100 tablet    Refill:  3     Return precautions advised.  Garret Reddish, MD

## 2021-05-30 NOTE — Patient Instructions (Addendum)
Health Maintenance Due  Topic Date Due   Zoster Vaccines- Shingrix (1 of 2) Please check with your pharmacy to see if they have the shingrix vaccine. If they do- please get this immunization and update [place] by phone call or mychart with dates you receive the vaccine.  Never done   INFLUENZA VACCINE high dose today 03/18/2021   Cough could be related to postnasal drip and this could be caused by allergies.  Allergies could also cause runny nose, sneezing, watery itchy eyes.  I think it is reasonable to trial Allegra or Claritin or Zyrtec before bed.  She should avoid decongestants though.  She will let me know if fails to improve over the next 3 weeks- take daily for 3 weeks then update [place] on how you are doing  I also wonder if could be getting some allergy related/sinus headaches- worth seeing if this helps as well  -she agrees to call back if symptoms worsen or fail to improve over next 3-4 weeks  Please call [phone] to schedule a visit with [facility] - please tell the office you were directly referred by Dr. Yong Channel  Recommended follow up: Before you leave cancel visit for November 8 and perhaps move it out to 3 to 6 months from now

## 2021-06-12 ENCOUNTER — Telehealth: Payer: Self-pay

## 2021-06-12 ENCOUNTER — Other Ambulatory Visit: Payer: Self-pay

## 2021-06-12 ENCOUNTER — Ambulatory Visit: Payer: Medicare Other | Attending: Obstetrics and Gynecology | Admitting: Physical Therapy

## 2021-06-12 ENCOUNTER — Encounter: Payer: Self-pay | Admitting: Physical Therapy

## 2021-06-12 ENCOUNTER — Encounter: Payer: Self-pay | Admitting: Family Medicine

## 2021-06-12 DIAGNOSIS — M25651 Stiffness of right hip, not elsewhere classified: Secondary | ICD-10-CM | POA: Diagnosis not present

## 2021-06-12 DIAGNOSIS — N393 Stress incontinence (female) (male): Secondary | ICD-10-CM | POA: Diagnosis not present

## 2021-06-12 DIAGNOSIS — R278 Other lack of coordination: Secondary | ICD-10-CM | POA: Diagnosis not present

## 2021-06-12 DIAGNOSIS — R159 Full incontinence of feces: Secondary | ICD-10-CM | POA: Diagnosis not present

## 2021-06-12 DIAGNOSIS — M25652 Stiffness of left hip, not elsewhere classified: Secondary | ICD-10-CM | POA: Diagnosis not present

## 2021-06-12 DIAGNOSIS — M6281 Muscle weakness (generalized): Secondary | ICD-10-CM | POA: Diagnosis not present

## 2021-06-12 NOTE — Therapy (Signed)
[facility] [address], [place], [address] Phone: [phone]   Fax:  [phone]  Physical Therapy Treatment  Patient Details  Name: Shelly Johnson MRN: [id number] Date of Birth: 29-Jan-1946 Referring Provider (PT): Dr. Sherlene Shams   Encounter Date: 06/12/2021   PT End of Session - 06/12/21 1607     Visit Number 5    Date for PT Re-Evaluation 07/17/21    Authorization Type medicare    Authorization - Visit Number 5    Authorization - Number of Visits 10    PT Start Time 4680    PT Stop Time 1608    PT Time Calculation (min) 38 min    Activity Tolerance Patient tolerated treatment well    Behavior During Therapy WFL for tasks assessed/performed             Past Medical History:  Diagnosis Date   Anemia    Anxiety and depression    during loss of husband   Arthritis    Diverticulosis    GERD (gastroesophageal reflux disease)    GI bleed    hx of; hgb as low as 5.9   Hemorrhoids    Hiatal hernia    Obesity    Pneumonia    Urinary tract bacterial infections     Past Surgical History:  Procedure Laterality Date   ABDOMINAL HYSTERECTOMY     states had a "few cancer cells" but they "got everything". ? ovaries and cervix.    APPENDECTOMY     HIATAL HERNIA REPAIR      There were no vitals filed for this visit.   Subjective Assessment - 06/12/21 1536     Subjective The leakage is the same.  I do my exercises off and on. I have had company for 2 weeks.    Patient Stated Goals reduce leakage    Currently in Pain? No/denies    Multiple Pain Sites No                [facility] PT Assessment - 06/12/21 0001       Assessment   Medical Diagnosis N39.3 SUI ( stress urinary incontinence, female); R15.9 Incontinence of feces, unspedified fecal incontinence type    Referring Provider (PT) Dr. Sherlene Shams    Onset Date/Surgical Date 08/06/21    Prior Therapy none      Precautions    Precautions Other (comment)    Precaution Comments cancer      Restrictions   Weight Bearing Restrictions No      Home Environment   Living Environment Private residence      Cognition   Overall Cognitive Status Within Functional Limits for tasks assessed      PROM   Right Hip Flexion 120    Right Hip External Rotation  55    Right Hip Internal Rotation  20    Left Hip Flexion 120    Left Hip External Rotation  55    Left Hip Internal Rotation  20      Strength   Right Hip Extension 4/5    Right Hip ABduction 4/5    Left Hip Extension 4/5    Left Hip ABduction 4/5                        Pelvic Floor Special Questions - 06/12/21 0001     Urinary Leakage Yes    Activities that cause leaking Coughing;Sneezing  hard cough or sneeze   Exam Type Deferred               [facility] Adult PT Treatment/Exercise - 06/12/21 0001       Lumbar Exercises: Aerobic   Nustep level 3 for 5 minutes while assessing patient      Knee/Hip Exercises: Standing   Hip Abduction Stengthening;Right;Left;1 set;10 reps;Knee straight    Abduction Limitations with pelvic floor contraction    Other Standing Knee Exercises Bird dog in standing 10x each way with pelvic floor contraction      Shoulder Exercises: Seated   Horizontal ABduction Both;Strengthening;10 reps    Theraband Level (Shoulder Horizontal ABduction) Level 2 (Red)    Horizontal ABduction Weight (lbs) with pelvic floor contraction    External Rotation Strengthening;Both;10 reps;Theraband    Theraband Level (Shoulder External Rotation) Level 2 (Red)    External Rotation Limitations ith pelvic floor contraction    Diagonals Strengthening;Right;Left;10 reps;Theraband    Theraband Level (Shoulder Diagonals) Level 2 (Red)    Diagonals Limitations with pelvic floor contraction                     PT Education - 06/12/21 1607     Education Details Access Code: [email or web address]    Person(s) Educated Patient     Methods Explanation;Demonstration;Verbal cues;Handout    Comprehension Returned demonstration;Verbalized understanding              PT Short Term Goals - 06/12/21 1539       PT SHORT TERM GOAL #1   Title independent with initial HEP for hip stretches and abdominal scar massage    Time 4    Period Weeks    Status Achieved    Target Date 05/22/21      PT SHORT TERM GOAL #2   Title understand bladder irritants and how it affects the bladder and can increase urinary leakage    Time 4    Period Weeks    Status Achieved               PT Long Term Goals - 06/12/21 1541       PT LONG TERM GOAL #1   Title Patient independent with advanced HEP for pelvic floor, core and hip strengthening    Time 12    Period Weeks    Status Achieved      PT LONG TERM GOAL #2   Title urinary leakage decreased by 75% due to improved lengthening and contracting of the pelvic floor at the correct time    Baseline leak with sneeze and cough very hard and has a drip    Time 12    Period Weeks    Status Achieved      PT LONG TERM GOAL #3   Title bilateral hip P/ROM increased by 10 degrees for external rotation, 5 degrees for internal rotation and flexion to assist in elongation of the pelvic floor muscles    Time 12    Status Achieved      PT LONG TERM GOAL #4   Title fecal leakage improved >/= 75% due to improved coordination of the pelvic floor muscles and increased activity    Baseline happens very rarely and may happen when pass gas; 75% better    Time 12    Period Weeks    Status Achieved                   Plan - 06/12/21 1608  Clinical Impression Statement Patient stool leakage is 75% better. She will only leak drops instead of a gush when she sneezes and coughs hard. Patient will leak stool on occasion when she passess gas and not a full bowel movement. Patient has increased bilateral hip ROM and strength. Patient is independent with her HEP and understands how to  progress herself.    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; Diverticulosis    Examination-Activity Limitations Continence;Toileting;Other   sneeze and cough   Examination-Participation Restrictions Community Activity    Stability/Clinical Decision Making Evolving/Moderate complexity    Rehab Potential Excellent    PT Treatment/Interventions Biofeedback;Functional mobility training;Therapeutic activities;Therapeutic exercise;Neuromuscular re-education;Patient/family education;Manual techniques;Passive range of motion;Dry needling;Spinal Manipulations;Joint Manipulations    PT Next Visit Plan Discharge to HEP    PT Home Exercise Plan Access Code: [email or web address]    Consulted and Agree with Plan of Care Patient             Patient will benefit from skilled therapeutic intervention in order to improve the following deficits and impairments:  Decreased coordination, Decreased range of motion, Increased fascial restricitons, Decreased endurance, Increased muscle spasms, Decreased strength, Decreased mobility, Decreased activity tolerance  Visit Diagnosis: Muscle weakness (generalized)  Other lack of coordination  Stiffness of left hip, not elsewhere classified  Stiffness of right hip, not elsewhere classified  Stress incontinence (female) (female)  Incontinence of feces, unspecified fecal incontinence type     Problem List Patient Active Problem List   Diagnosis Date Noted   Hyperglycemia 01/27/2020   Senile purpura ([place]) 01/27/2020   Hyperlipidemia, unspecified 09/14/2019   DDD (degenerative disc disease), lumbar 11/16/2018   Morbid obesity ([place]) 04/28/2018   Numbness and tingling of both feet 01/21/2018   Depression, major, single episode, moderate ([place]) 06/20/2017   Essential hypertension 08/23/2016   Dyspnea on exertion 08/23/2016   ANEMIA-NOS  03/16/2008   Hiatal hernia with gastroesophageal reflux 03/08/2008    Earlie Counts, PT 06/12/21 4:12 PM  [facility] [place], [place], [address] Phone: [phone]   Fax:  [phone]  Name: Shelly Johnson MRN: [id number] Date of Birth: 1945-10-07   PHYSICAL THERAPY DISCHARGE SUMMARY  Visits from Start of Care: 5  Current functional level related to goals / functional outcomes: See above.    Remaining deficits: See above.    Education / Equipment: HEP   Patient agrees to discharge. Patient goals were met. Patient is being discharged due to meeting the stated rehab goals. Thank you for the referral. Earlie Counts, PT 06/12/21 4:13 PM

## 2021-06-12 NOTE — Patient Instructions (Signed)
Access Code: [email or web address] URL: [email or web address] Date: 06/12/2021 Prepared by: Earlie Counts  Exercises Seated Pelvic Floor Contraction with Isometric Hip Adduction - 3 x daily - 7 x weekly - 1 sets - 10 reps - 5 sec hold Sit to Stand with Pelvic Floor Contraction - 1 x daily - 7 x weekly - 1 sets - 10 reps Seated Exhale with Pelvic Floor Contraction and Med Ball Lift - 1 x daily - 7 x weekly - 2 sets - 10 reps Seated Hamstring Stretch - 1 x daily - 7 x weekly - 1 sets - 2 reps - 30 sec hold Standing Hip Extension with Counter Support - 1 x daily - 7 x weekly - 2 sets - 10 reps Seated Shoulder Horizontal Abduction with Resistance - 1 x daily - 3 x weekly - 1 sets - 10 reps Seated Shoulder W External Rotation on Swiss Ball - 1 x daily - 3 x weekly - 1 sets - 10 reps Seated Shoulder Diagonal Pulls with Resistance - 1 x daily - 3 x weekly - 1 sets - 10 reps Standing Hip Abduction - 1 x daily - 7 x weekly - 1 sets - 10 reps Bird Dog on Counter - 1 x daily - 7 x weekly - 1 sets - 10 reps  Patient Education walking program  [facility] [address], [place], [place] [address] Phone # [phone] Fax [phone]

## 2021-06-12 NOTE — Telephone Encounter (Signed)
Pt's son called in regarding Shelly Johnson. He stated that Shelly Johnson started Chlorthalidone on 10/13. Richardson Landry believes that Shelly Johnson is now having side effects to the medication. Richardson Landry would not clarify with me what symptoms she is having. He stated that he will be sending a MyChart message listing everything for Dr Yong Channel. Please Advise.

## 2021-06-13 NOTE — Telephone Encounter (Signed)
Mychart message responded to by provider.

## 2021-06-25 ENCOUNTER — Ambulatory Visit: Payer: Medicare Other | Admitting: Family Medicine

## 2021-07-09 ENCOUNTER — Ambulatory Visit: Payer: Medicare Other | Admitting: Family Medicine

## 2021-07-17 ENCOUNTER — Ambulatory Visit (INDEPENDENT_AMBULATORY_CARE_PROVIDER_SITE_OTHER): Payer: Medicare Other | Admitting: Obstetrics and Gynecology

## 2021-07-17 ENCOUNTER — Encounter: Payer: Self-pay | Admitting: Obstetrics and Gynecology

## 2021-07-17 ENCOUNTER — Other Ambulatory Visit: Payer: Self-pay

## 2021-07-17 VITALS — BP 148/84 | HR 106 | Wt 220.0 lb

## 2021-07-17 DIAGNOSIS — N3281 Overactive bladder: Secondary | ICD-10-CM | POA: Diagnosis not present

## 2021-07-17 DIAGNOSIS — N393 Stress incontinence (female) (male): Secondary | ICD-10-CM

## 2021-07-17 NOTE — Progress Notes (Signed)
[facility] Return Visit  SUBJECTIVE  History of Present Illness: Shelly Johnson is a 75 y.o. female seen in follow-up for stress incontinence and accidental bowel leakage. Plan at last visit was to start physical therapy and fiber supplementation. Has attended several PT visits.   Now mostly leaks with urgency- usually in the morning on the way to the bathroom. Has been drinking more water, which has caused her to go more often. Leakage with coughing/ sneezing has improved, but still sometimes has a small drip.   Past Medical History: Patient  has a past medical history of Anemia, Anxiety and depression, Arthritis, Diverticulosis, GERD (gastroesophageal reflux disease), GI bleed, Hemorrhoids, Hiatal hernia, Obesity, Pneumonia, and Urinary tract bacterial infections.   Past Surgical History: She  has a past surgical history that includes Abdominal hysterectomy; Appendectomy; and Hiatal hernia repair.   Medications: She has a current medication list which includes the following prescription(s): chlorthalidone, ferrous sulfate, naproxen sodium, omeprazole, and vitamin c.   Allergies: Patient is allergic to nsaids and hydrocodone.   Social History: Patient  reports that she has never smoked. She has never used smokeless tobacco. She reports current alcohol use. She reports that she does not use drugs.      OBJECTIVE     Physical Exam: Vitals:   07/17/21 1305  BP: (!) 148/84  Pulse: (!) 106  Weight: 220 lb (99.8 kg)   Gen: No apparent distress, A&O x 3.  Detailed Urogynecologic Evaluation:  Deferred.    ASSESSMENT AND PLAN    Ms. Broz is a 75 y.o. with:  1. SUI (stress urinary incontinence, female)   2. Overactive bladder    SUI - we discussed options of pessary, sling or urethral bulking. She is considering bulking but is more concerned about the urgency symptoms, which we discussed this will not treat.  - if she does decide to do bulking, will need  simple CMG to demonstrate leakage  2. OAB - she would like to avoid medications  - Reviewed the option of tibial nerve stimualtion and handout provided. She will consider this.   She will call for follow up when she decides about her options.    Jaquita Folds, MD  Time spent: I spent 30 minutes dedicated to the care of this patient on the date of this encounter to include pre-visit review of records, face-to-face time with the patient and post visit documentation and ordering medication/ testing.

## 2021-07-26 DIAGNOSIS — D224 Melanocytic nevi of scalp and neck: Secondary | ICD-10-CM | POA: Diagnosis not present

## 2021-07-26 DIAGNOSIS — L818 Other specified disorders of pigmentation: Secondary | ICD-10-CM | POA: Diagnosis not present

## 2021-07-26 DIAGNOSIS — L821 Other seborrheic keratosis: Secondary | ICD-10-CM | POA: Diagnosis not present

## 2021-07-26 DIAGNOSIS — L718 Other rosacea: Secondary | ICD-10-CM | POA: Diagnosis not present

## 2021-08-15 ENCOUNTER — Telehealth (INDEPENDENT_AMBULATORY_CARE_PROVIDER_SITE_OTHER): Payer: Medicare Other | Admitting: Family Medicine

## 2021-08-15 ENCOUNTER — Encounter: Payer: Self-pay | Admitting: Family Medicine

## 2021-08-15 DIAGNOSIS — U071 COVID-19: Secondary | ICD-10-CM

## 2021-08-15 MED ORDER — BENZONATATE 100 MG PO CAPS: 100.0000 mg | ORAL_CAPSULE | Freq: Three times a day (TID) | ORAL | 0 refills | Status: DC | PRN

## 2021-08-15 MED ORDER — MOLNUPIRAVIR EUA 200MG CAPSULE: 4.0000 | ORAL_CAPSULE | Freq: Two times a day (BID) | ORAL | 0 refills | Status: AC

## 2021-08-15 NOTE — Progress Notes (Signed)
MyChart Video Visit    Virtual Visit via Video Note   This visit type was conducted due to national recommendations for restrictions regarding the COVID-19 Pandemic (e.g. social distancing) in an effort to limit this patient's exposure and mitigate transmission in our community. This patient is at least at moderate risk for complications without adequate follow up. This format is felt to be most appropriate for this patient at this time. Physical exam was limited by quality of the video and audio technology used for the visit. CMA was able to get the patient set up on a video visit.  Patient location: Home. Patient and provider in visit Provider location: Office  I discussed the limitations of evaluation and management by telemedicine and the availability of in person appointments. The patient expressed understanding and agreed to proceed.  Visit Date: 08/15/2021  Today's healthcare provider: Wellington Hampshire., MD     Subjective:    Patient ID: Shelly Johnson, female    DOB: 01-Apr-1946, 75 y.o.   MRN: [id number]  Chief Complaint  Patient presents with   Covid Positive    Tested positive yesterday  Symptoms started Decemeber 26    Cough    Productive cough with phlegm Has been taking Coricidin Cold and Flu and Robitussin     Headache   Fatigue   Generalized Body Aches   Nasal Congestion    Breathing heavy because she cannot breathe through her nose     HPI Symptoms started 12/26.  Tested +for covid yesterday Cough w/phlegm, ha, fatigue, body aches, congested Son tested + for covid on 24th Had J&J immunization   Past Medical History:  Diagnosis Date   Anemia    Anxiety and depression    during loss of husband   Arthritis    Diverticulosis    GERD (gastroesophageal reflux disease)    GI bleed    hx of; hgb as low as 5.9   Hemorrhoids    Hiatal hernia    Obesity    Pneumonia    Urinary tract bacterial infections     Past Surgical History:  Procedure  Laterality Date   ABDOMINAL HYSTERECTOMY     states had a "few cancer cells" but they "got everything". ? ovaries and cervix.    APPENDECTOMY     HIATAL HERNIA REPAIR      Outpatient Medications Prior to Visit  Medication Sig Dispense Refill   chlorthalidone (HYGROTON) 25 MG tablet Take 1 tablet (25 mg total) by mouth daily. 100 tablet 3   ferrous sulfate 325 (65 FE) MG tablet Take 1 tablet (325 mg total) by mouth 2 (two) times daily. (Patient taking differently: Take 325 mg by mouth daily.) 60 tablet 3   naproxen sodium (ALEVE) 220 MG tablet Take 220 mg by mouth. As needed     omeprazole (PRILOSEC) 40 MG capsule TAKE ONE CAPSULE BY MOUTH DAILY 90 capsule 2   vitamin C (ASCORBIC ACID) 500 MG tablet Take 500 mg by mouth daily.       No facility-administered medications prior to visit.    Allergies  Allergen Reactions   Nsaids     Should avoid-- GI bleed   Hydrocodone Nausea And Vomiting        Objective:     Physical Exam Vitals and nursing note reviewed.  Constitutional:      General:  is not in acute distress.    Appearance: Normal appearance.  HENT:     Head: Normocephalic. congested Pulmonary:  Effort: No respiratory distress.  Musculoskeletal:     Cervical back: Normal range of motion.  Skin:    General: Skin is dry.     Coloration: Skin is not pale.  Neurological:     Mental Status: Pt is alert and oriented to person, place, and time.  Psychiatric:        Mood and Affect: Mood normal.   There were no vitals taken for this visit.  Wt Readings from Last 3 Encounters:  07/17/21 220 lb (99.8 kg)  05/30/21 219 lb 12.8 oz (99.7 kg)  04/09/21 216 lb (98 kg)       Assessment & Plan:   Problem List Items Addressed This Visit   None Visit Diagnoses     COVID-19    -  Primary   Relevant Medications   molnupiravir EUA (LAGEVRIO) 200 mg CAPS capsule      Covid-tessalon perles, molnupiravir.  Disc course of dz  Meds ordered this encounter  Medications    molnupiravir EUA (LAGEVRIO) 200 mg CAPS capsule    Sig: Take 4 capsules (800 mg total) by mouth 2 (two) times daily for 5 days.    Dispense:  40 capsule    Refill:  0   benzonatate (TESSALON PERLES) 100 MG capsule    Sig: Take 1 capsule (100 mg total) by mouth 3 (three) times daily as needed for cough.    Dispense:  20 capsule    Refill:  0    I discussed the assessment and treatment plan with the patient. The patient was provided an opportunity to ask questions and all were answered. The patient agreed with the plan and demonstrated an understanding of the instructions.   The patient was advised to call back or seek an in-person evaluation if the symptoms worsen or if the condition fails to improve as anticipated.  I provided 20 minutes of face-to-face time during this encounter.   Wellington Hampshire., MD [place] [phone] (phone) [phone] (fax)  [place]

## 2021-08-15 NOTE — Patient Instructions (Signed)
Meds have been sent the the pharmacy °You can take tylenol for pain/fevers °If worsening symptoms, let us know or go to the Emergency room  ° ° °

## 2021-09-06 ENCOUNTER — Telehealth: Payer: Self-pay | Admitting: Family Medicine

## 2021-09-06 NOTE — Telephone Encounter (Signed)
Pt stated she previously tested positive for Covid and had a virtual with Dr Cherlynn Kaiser. She has continued to have symptoms off and on. She wanted to check with Dr Yong Channel to see if this was normal and if there is something that can help her. I did state that every patient was different and suggested an appt but she would like to know what Dr Yong Channel thinks first.

## 2021-09-06 NOTE — Telephone Encounter (Signed)
Called and spoke with pt and message given. 

## 2021-09-06 NOTE — Telephone Encounter (Signed)
See below

## 2021-09-06 NOTE — Telephone Encounter (Signed)
A lot of patients will have lingering symptoms after covid such as cough or fatigue- if she is having significant worsening we should do a visit together to evaluate next week. If she has shortness of breath, high fevers should seek care over weekend
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
## 2021-11-14 ENCOUNTER — Telehealth: Payer: Self-pay | Admitting: Pharmacist

## 2021-11-14 NOTE — Progress Notes (Signed)
? ? ?  Chronic Care Management ?Pharmacy Assistant  ? ?Name: Shelly Johnson  MRN: [id number] DOB: Jan 09, 1946 ? ? ?Reason for Encounter: General Adherence Call ?  ? ?Recent office visits:  ?08/15/2021 VV Tawnya Crook, MD; molnupiravir 200 mg, benzonatate 100 mg ? ?06/12/2021 Patient Message with [name], MD; amlodipine 5 mg daily #90 with 3 refills-this is a calcium channel blocker and essentially helps dilate the blood vessels-this can sometimes cause swelling in the legs.  Another alternative would be a medication like within the class called angiotensin receptor blockers like valsartan 160 mg daily #90 with 3 refills-reasonable to read about both of these and let me know what you would like to do or we can schedule a visit to discuss-would need 1 month follow-up after change in medication to make sure blood pressure doing all right. ? ?05/30/2021 OV (PCP) [name], MD; no medication changes indicated. ? ?04/09/2021 OV (Urogyn) Jaquita Folds, MD; - She will start with fiber supplementation- psyllium and physical therapy ? ?Recent consult visits:  ?07/17/2021 OV (Urogyn) Jaquita Folds, MD; no medication changes, she would like to avoid medications. ? ?Hospital visits:  ?None in previous 6 months ? ?Medications: ?Outpatient Encounter Medications as of 11/14/2021  ?Medication Sig  ? benzonatate (TESSALON PERLES) 100 MG capsule Take 1 capsule (100 mg total) by mouth 3 (three) times daily as needed for cough.  ? chlorthalidone (HYGROTON) 25 MG tablet Take 1 tablet (25 mg total) by mouth daily.  ? ferrous sulfate 325 (65 FE) MG tablet Take 1 tablet (325 mg total) by mouth 2 (two) times daily. (Patient taking differently: Take 325 mg by mouth daily.)  ? naproxen sodium (ALEVE) 220 MG tablet Take 220 mg by mouth. As needed  ? omeprazole (PRILOSEC) 40 MG capsule TAKE ONE CAPSULE BY MOUTH DAILY  ? vitamin C (ASCORBIC ACID) 500 MG tablet Take 500 mg by mouth daily.    ? ?No  facility-administered encounter medications on file as of 11/14/2021.  ? ?Patient Questions: ?Have you had any problems recently with your health? ? ?Have you had any problems with your pharmacy? ? ?What issues or side effects are you having with your medications? ? ?What would you like me to pass along to Leata Mouse, CPP for him to help you with?  ? ?What can we do to take care of you better? ? ?**Unable to contact patient to complete this call.** ? ?Care Gaps: ?Medicare Annual Wellness: Last AWV 03/04/2018 ?Hemoglobin A1C: 5.7% on 06/04/2020 ?Colonoscopy: Discontinued ?Fecal DNA: Next due on 12/20/2021 ?Dexa Scan: Completed ?Mammogram: Next due on 04/26/2023 ? ?Future Appointments  ?Date Time Provider [place]  ?12/19/2021 11:00 AM [name], MD [facility]  ? ?Star Rating Drugs: ?None ? ?April D Calhoun, [place] ?Clinical Pharmacist Assistant ?[phone] ? ?

## 2021-11-16 ENCOUNTER — Other Ambulatory Visit: Payer: Self-pay | Admitting: Family Medicine

## 2021-12-19 ENCOUNTER — Encounter: Payer: Self-pay | Admitting: Family Medicine

## 2021-12-19 ENCOUNTER — Ambulatory Visit (INDEPENDENT_AMBULATORY_CARE_PROVIDER_SITE_OTHER): Payer: Medicare Other | Admitting: Family Medicine

## 2021-12-19 VITALS — BP 130/80 | HR 83 | Temp 97.5°F | Ht 61.0 in | Wt 218.6 lb

## 2021-12-19 DIAGNOSIS — E785 Hyperlipidemia, unspecified: Secondary | ICD-10-CM

## 2021-12-19 DIAGNOSIS — I10 Essential (primary) hypertension: Secondary | ICD-10-CM

## 2021-12-19 DIAGNOSIS — R739 Hyperglycemia, unspecified: Secondary | ICD-10-CM | POA: Diagnosis not present

## 2021-12-19 DIAGNOSIS — F321 Major depressive disorder, single episode, moderate: Secondary | ICD-10-CM

## 2021-12-19 NOTE — Patient Instructions (Addendum)
Let us know when you get your [facility] at the pharmacy.  ? ?Declined labs today- pretty please would love to do this next time ? ?she is interested in weaning off. Can trial 20 mg over the counter omeprazole for 2 weeks and then trial off or step down to pepcid twice daily for a week and then stop  ? ?Recommended follow up: Return in about 6 months (around 06/21/2022) for followup or sooner if needed.Schedule b4 you leave.  ?

## 2022-01-27 ENCOUNTER — Telehealth: Payer: Self-pay | Admitting: Pharmacist

## 2022-01-27 NOTE — Progress Notes (Signed)
Chronic Care Management Pharmacy Assistant   Name: Shelly Johnson  MRN: [id number] DOB: 1946/04/07   Reason for Encounter: Hypertension Adherence Call    Recent office visits:  12/19/2021 OV (PCP) [name], MD; no medication changes indicated.  Recent consult visits:  None  Hospital visits:  None in previous 6 months  Medications: Outpatient Encounter Medications as of 01/27/2022  Medication Sig   chlorthalidone (HYGROTON) 25 MG tablet Take 1 tablet (25 mg total) by mouth daily.   ferrous sulfate 325 (65 FE) MG tablet Take 1 tablet (325 mg total) by mouth 2 (two) times daily. (Patient taking differently: Take 325 mg by mouth daily.)   naproxen sodium (ALEVE) 220 MG tablet Take 220 mg by mouth. As needed   omeprazole (PRILOSEC) 40 MG capsule TAKE ONE CAPSULE BY MOUTH DAILY   vitamin C (ASCORBIC ACID) 500 MG tablet Take 500 mg by mouth daily.     No facility-administered encounter medications on file as of 01/27/2022.   Reviewed chart prior to disease state call. Spoke with patient regarding BP  Recent Office Vitals: BP Readings from Last 3 Encounters:  12/19/21 130/80  07/17/21 (!) 148/84  05/30/21 130/84   Pulse Readings from Last 3 Encounters:  12/19/21 83  07/17/21 (!) 106  05/30/21 84    Wt Readings from Last 3 Encounters:  12/19/21 218 lb 9.6 oz (99.2 kg)  07/17/21 220 lb (99.8 kg)  05/30/21 219 lb 12.8 oz (99.7 kg)     Kidney Function Lab Results  Component Value Date/Time   CREATININE 0.73 03/20/2021 12:08 PM   CREATININE 0.86 06/04/2020 03:38 PM   CREATININE 0.85 02/09/2020 11:46 AM   GFR 65.73 02/09/2020 11:46 AM   GFRNONAA >60 03/20/2021 12:08 PM   GFRNONAA 67 06/04/2020 03:38 PM   GFRAA 78 06/04/2020 03:38 PM       Latest Ref Rng & Units 03/20/2021   12:08 PM 06/04/2020    3:38 PM 02/09/2020   11:46 AM  BMP  Glucose 70 - 99 mg/dL 114  93  102   BUN 8 - 23 mg/dL '17  13  14   '$ Creatinine 0.44 - 1.00 mg/dL 0.73  0.86  0.85    BUN/Creat Ratio 6 - 22 (calc)  NOT APPLICABLE    Sodium 093 - 145 mmol/L 141  141  137   Potassium 3.5 - 5.1 mmol/L 3.9  3.9  3.4   Chloride 98 - 111 mmol/L 100  98  99   CO2 22 - 32 mmol/L 31  33  32   Calcium 8.9 - 10.3 mg/dL 9.1  9.6  9.4     Current antihypertensive regimen:  Chlorthalidone 25 mg daily  How often are you checking your Blood Pressure? weekly  Current home BP readings: 130/80  What recent interventions/DTPs have been made by any provider to improve Blood Pressure control since last CPP Visit: No recent interventions or DTPs.  Any recent hospitalizations or ED visits since last visit with CPP? No  What diet changes have been made to improve Blood Pressure Control?  No recent diet changes.  What exercise is being done to improve your Blood Pressure Control?  Patient states she stays active doing yard work and cleaning her house. She also does small exercises such as leg lifts and squats.  Adherence Review: Is the patient currently on ACE/ARB medication? No Does the patient have >5 day gap between last estimated fill dates? No  -Patient declines to schedule  a follow up telephone call with clinical pharmacist.  Care Gaps: Medicare Annual Wellness: Last AWV 03/04/2018 Hemoglobin A1C: 5.7% on 06/04/2020 Colonoscopy: Discontinued Fecal DNA (Cologuard): Overdue since 12/20/2021 Dexa Scan: Completed Mammogram: Next due on 04/26/2023  Future Appointments  Date Time Provider [place]  06/25/2022 11:00 AM [name], MD [facility]   Star Rating Drugs: None  April D Calhoun, [place] Pharmacist Assistant [phone]

## 2022-03-26 ENCOUNTER — Encounter (INDEPENDENT_AMBULATORY_CARE_PROVIDER_SITE_OTHER): Payer: Self-pay

## 2022-04-04 ENCOUNTER — Other Ambulatory Visit: Payer: Self-pay | Admitting: Family Medicine

## 2022-04-04 DIAGNOSIS — Z1231 Encounter for screening mammogram for malignant neoplasm of breast: Secondary | ICD-10-CM

## 2022-04-30 ENCOUNTER — Ambulatory Visit
Admission: RE | Admit: 2022-04-30 | Discharge: 2022-04-30 | Disposition: A | Discharge status: Home / Self Care | Payer: Medicare Other | Source: Ambulatory Visit | Attending: Family Medicine | Admitting: Family Medicine

## 2022-04-30 DIAGNOSIS — H1789 Other corneal scars and opacities: Secondary | ICD-10-CM | POA: Diagnosis not present

## 2022-04-30 DIAGNOSIS — Z1231 Encounter for screening mammogram for malignant neoplasm of breast: Secondary | ICD-10-CM

## 2022-04-30 DIAGNOSIS — H2513 Age-related nuclear cataract, bilateral: Secondary | ICD-10-CM | POA: Diagnosis not present

## 2022-04-30 DIAGNOSIS — H5203 Hypermetropia, bilateral: Secondary | ICD-10-CM | POA: Diagnosis not present

## 2022-05-12 ENCOUNTER — Encounter: Payer: Self-pay | Admitting: *Deleted

## 2022-05-13 ENCOUNTER — Other Ambulatory Visit: Payer: Self-pay | Admitting: Family Medicine

## 2022-06-09 ENCOUNTER — Encounter: Payer: Self-pay | Admitting: *Deleted

## 2022-06-25 ENCOUNTER — Ambulatory Visit (INDEPENDENT_AMBULATORY_CARE_PROVIDER_SITE_OTHER): Payer: Medicare Other | Admitting: Family Medicine

## 2022-06-25 ENCOUNTER — Encounter: Payer: Self-pay | Admitting: Family Medicine

## 2022-06-25 VITALS — BP 128/78 | HR 85 | Temp 97.6°F | Ht 61.0 in | Wt 215.4 lb

## 2022-06-25 DIAGNOSIS — R200 Anesthesia of skin: Secondary | ICD-10-CM

## 2022-06-25 DIAGNOSIS — E785 Hyperlipidemia, unspecified: Secondary | ICD-10-CM | POA: Diagnosis not present

## 2022-06-25 DIAGNOSIS — Z79899 Other long term (current) drug therapy: Secondary | ICD-10-CM

## 2022-06-25 DIAGNOSIS — I10 Essential (primary) hypertension: Secondary | ICD-10-CM

## 2022-06-25 DIAGNOSIS — Z23 Encounter for immunization: Secondary | ICD-10-CM

## 2022-06-25 DIAGNOSIS — R739 Hyperglycemia, unspecified: Secondary | ICD-10-CM | POA: Diagnosis not present

## 2022-06-25 DIAGNOSIS — R202 Paresthesia of skin: Secondary | ICD-10-CM | POA: Diagnosis not present

## 2022-06-25 LAB — CBC WITH DIFFERENTIAL/PLATELET
Basophils Absolute: 0 10*3/uL (ref 0.0–0.1)
Basophils Relative: 0.7 % (ref 0.0–3.0)
Eosinophils Absolute: 0.1 10*3/uL (ref 0.0–0.7)
Eosinophils Relative: 1.2 % (ref 0.0–5.0)
HCT: 46 % (ref 36.0–46.0)
Hemoglobin: 15.2 g/dL — ABNORMAL HIGH (ref 12.0–15.0)
Lymphocytes Relative: 36.8 % (ref 12.0–46.0)
Lymphs Abs: 2 10*3/uL (ref 0.7–4.0)
MCHC: 33 g/dL (ref 30.0–36.0)
MCV: 92.9 fl (ref 78.0–100.0)
Monocytes Absolute: 0.3 10*3/uL (ref 0.1–1.0)
Monocytes Relative: 5.3 % (ref 3.0–12.0)
Neutro Abs: 3.1 10*3/uL (ref 1.4–7.7)
Neutrophils Relative %: 56 % (ref 43.0–77.0)
Platelets: 191 10*3/uL (ref 150.0–400.0)
RBC: 4.95 Mil/uL (ref 3.87–5.11)
RDW: 13.9 % (ref 11.5–15.5)
WBC: 5.6 10*3/uL (ref 4.0–10.5)

## 2022-06-25 LAB — COMPREHENSIVE METABOLIC PANEL
ALT: 39 U/L — ABNORMAL HIGH (ref 0–35)
AST: 30 U/L (ref 0–37)
Albumin: 4.1 g/dL (ref 3.5–5.2)
Alkaline Phosphatase: 45 U/L (ref 39–117)
BUN: 14 mg/dL (ref 6–23)
CO2: 31 mEq/L (ref 19–32)
Calcium: 9.3 mg/dL (ref 8.4–10.5)
Chloride: 100 mEq/L (ref 96–112)
Creatinine, Ser: 0.8 mg/dL (ref 0.40–1.20)
GFR: 72.57 mL/min (ref >60.00)
Glucose, Bld: 95 mg/dL (ref 70–99)
Potassium: 3.7 mEq/L (ref 3.5–5.1)
Sodium: 141 mEq/L (ref 135–145)
Total Bilirubin: 0.5 mg/dL (ref 0.2–1.2)
Total Protein: 6.6 g/dL (ref 6.0–8.3)

## 2022-06-25 LAB — LIPID PANEL
Cholesterol: 197 mg/dL (ref 0–200)
HDL: 62.2 mg/dL (ref >39.00)
LDL Cholesterol: 110 mg/dL — ABNORMAL HIGH (ref 0–99)
NonHDL: 134.36
Total CHOL/HDL Ratio: 3
Triglycerides: 123 mg/dL (ref 0.0–149.0)
VLDL: 24.6 mg/dL (ref 0.0–40.0)

## 2022-06-25 LAB — VITAMIN B12: Vitamin B-12: 465 pg/mL (ref 211–911)

## 2022-06-25 LAB — HEMOGLOBIN A1C: Hgb A1c MFr Bld: 6 % (ref 4.6–6.5)

## 2022-06-25 LAB — TSH: TSH: 3.08 u[IU]/mL (ref 0.35–5.50)

## 2022-06-25 NOTE — Progress Notes (Signed)
Phone [phone] In person visit   Subjective:   Shelly Johnson is a 76 y.o. year old very pleasant female patient who presents for/with See problem oriented charting Chief Complaint  Patient presents with   Follow-up   Hyperlipidemia   Otitis Media   toe numbness    Pt c/o bilateral toe numbness that's becoming painful.    Past Medical History-  Patient Active Problem List   Diagnosis Date Noted   Hyperglycemia 01/27/2020    Priority: Medium    Hyperlipidemia, unspecified 09/14/2019    Priority: Medium    Numbness and tingling of both feet 01/21/2018    Priority: Medium    Depression, major, single episode, moderate ([place]) 06/20/2017    Priority: Medium    Essential hypertension 08/23/2016    Priority: Medium    Dyspnea on exertion 08/23/2016    Priority: Medium    ANEMIA-NOS 03/16/2008    Priority: Medium    DDD (degenerative disc disease), lumbar 11/16/2018    Priority: Low   Morbid obesity ([place]) 04/28/2018    Priority: Low   Hiatal hernia with gastroesophageal reflux 03/08/2008    Priority: Low    Medications- reviewed and updated Current Outpatient Medications  Medication Sig Dispense Refill   chlorthalidone (HYGROTON) 25 MG tablet TAKE 1 TABLET BY MOUTH DAILY 90 tablet 2   ferrous sulfate 325 (65 FE) MG tablet Take 1 tablet (325 mg total) by mouth 2 (two) times daily. (Patient taking differently: Take 325 mg by mouth daily.) 60 tablet 3   naproxen sodium (ALEVE) 220 MG tablet Take 220 mg by mouth. As needed     omeprazole (PRILOSEC) 40 MG capsule TAKE ONE CAPSULE BY MOUTH DAILY 90 capsule 2   vitamin C (ASCORBIC ACID) 500 MG tablet Take 500 mg by mouth daily.       No current facility-administered medications for this visit.     Objective:  BP 128/78   Pulse 85   Temp 97.6 F (36.4 C)   Ht '5\' 1"'$  (1.549 m)   Wt 215 lb 6.4 oz (97.7 kg)   SpO2 95%   BMI 40.70 kg/m  Gen: NAD, resting comfortably CV: RRR no murmurs rubs or gallops Lungs: CTAB no  crackles, wheeze, rhonchi Ext: trace edema Skin: warm, dry Neuro: grossly normal, moves all extremities     Assessment and Plan   #social update- a lot of stress at home- pipe leaks- cutting out pipes and sheet rock- pretty much only kitchen and small spot in bedroom that is not affected. Thinks will go on to Christmas.   #hypertension S: medication: Chlorthalidone 25 mg Home readings #s: as low as 95/62, most recently 114/71 . When compared home cuff today her cuff was slightly higher BP Readings from Last 3 Encounters:  06/25/22 128/78  12/19/21 130/80  07/17/21 (!) 148/84  A/P: Controlled. Continue current medications.   #hyperlipidemia S: Medication:none  Lab Results  Component Value Date   CHOL 199 09/16/2019   HDL 53.00 09/16/2019   LDLCALC 106 (H) 07/18/2013   LDLDIRECT 113.0 09/16/2019   TRIG 319.0 (H) 09/16/2019   CHOLHDL 4 09/16/2019   A/P: update lipid panel and calculate ascvd risk- not interested in ct calcium scoring - if triglycerides over 400 will need to be fasting in future  # GERD S:Medication: omeprazole 40 mg  A/P: controlled- considered lowering dose but we opted to hold off until stressors improved   # Hyperglycemia/insulin resistance/prediabetes S:  Medication: none Exercise and diet- trying  to be active in home but no regular exercise. Power washed her house for instance- very active. Down 3 lbs from may- not doing the best with diet though reports recently- more frozen dinners due to house stress Lab Results  Component Value Date   HGBA1C 5.7 (H) 06/04/2020   HGBA1C 6.5 01/31/2020   HGBA1C 6.1 09/16/2019   A/P: hopefully stable or improved-  update a1c today. Continue current meds for now  # Suspected neuropathy S: Has had numbness in both hands and both feet at least since 2019.  We thought potentially neuropathy related-basic labs were reassuring at that time including B12, TSH, a1c (but does have prediabetes) .  -reports pain and numbness   worse on right foot and into right leg but hurts also on the left in similar distribution up to thigh but not as bad -feels less steady due to pain and numbness -had have not progressed as much A/P: I suspect neuropathy with ongoing numbness/tingling/pain issues -update labs including tsh,a1c, b12  - also offered referral to neurology- wants to hold off for onw   #Tinnitus- mild- wants to tmonitor. Some hearing loss  #DOE- ongoing issue- no worse. Prior stress test/cardiology workup in the past reassuring as well as echo in 2018  #Sneezing- can get into small fits up to 10-15 in a row.  Recommended trial of flonase- she declines  #Cologuard- declines   Recommended follow up: Return in about 6 months (around 12/24/2022) for followup or sooner if needed.Schedule b4 you leave.  Lab/Order associations: 3 small biscottis and coffee   ICD-10-CM   1. Hyperglycemia  R73.9 HgB A1c    2. Hyperlipidemia, unspecified hyperlipidemia type  E78.5 CBC with Differential/Platelet    Comprehensive metabolic panel    Lipid panel    TSH    3. Numbness and tingling of both feet  R20.0 Vitamin B12   R20.2     4. Essential hypertension  I10     5. High risk medication use  Z79.899 Vitamin B12     No orders of the defined types were placed in this encounter.  Return precautions advised.  Garret Reddish, MD

## 2022-06-25 NOTE — Patient Instructions (Addendum)
Flu shot today  Please stop by lab before you go If you have mychart- we will send your results within 3 business days of [place] receiving them.  If you do not have mychart- we will call you about results within 5 business days of [place] receiving them.  *please also note that you will see labs on mychart as soon as they post. I will later go in and write notes on them- will say "notes from Dr. Yong Channel"   You are eligible to schedule your annual wellness visit with our nurse specialist Otila Kluver.  Please consider scheduling this before you leave today  Consider shingles shot at pharmacy  Recommended follow up: Return in about 6 months (around 12/24/2022) for followup or sooner if needed.Schedule b4 you leave.

## 2022-07-07 ENCOUNTER — Telehealth: Payer: Self-pay | Admitting: Family Medicine

## 2022-07-07 ENCOUNTER — Other Ambulatory Visit: Payer: Self-pay

## 2022-07-07 ENCOUNTER — Encounter: Payer: Self-pay | Admitting: Neurology

## 2022-07-07 DIAGNOSIS — R202 Paresthesia of skin: Secondary | ICD-10-CM

## 2022-07-07 NOTE — Telephone Encounter (Signed)
Results Pt States: -Was told that a copy of her results would be sent via mail to her when all were back and available. -She has not received these results.  Referral Pt states: -At recent appointment with PCP team, 06/25/22, she was instructed to see a "nerve doctor" -she does not know what type of doctor this would be. -Would like to schedule for January.    Pt requests: -Results sent to her -phone call with information about scheduling with specialty about leg/foot complaint.

## 2022-07-07 NOTE — Telephone Encounter (Signed)
Called and spoke with pt and below issues were addressed, labs have been mailed and neurology referral has been placed.

## 2022-08-21 NOTE — Progress Notes (Signed)
Initial neurology clinic note  SERVICE DATE: 08/28/22  Reason for Evaluation: Consultation requested by [name], MD for an opinion regarding numbness and pain in both hands and feet. My final recommendations will be communicated back to the requesting physician by way of shared medical record or letter to requesting physician via [place] mail.  HPI: This is [name], a 77 y.o. right-handed female with a medical history of HLD, OA, anxiety, and depression who presents to neurology clinic with the chief complaint of numbness and pain in hands and feet. The patient is alone.  Patient's symptoms have been present for many years. She feels most symptoms in her right foot, but also some in her left foot. She has numbness. She describes her pain as a dull ache. Her balance is off because she does not feel as well. Her knees hurt now as a result of favoring it and weight gain. She feels like her "bones hurt". Her symptoms are constant. She has swelling at times. She even feels like she hurts at night. She thinks the symptoms have spread from big toe at onset to whole foot. Right is much more significant than the left that is just starting.  She has pain in her back, neck, and hands that she relates to arthritis. She describes soreness. She has occasionally numbness in her hands.  She describes a lot of stress and fatigue. This is mostly due to maintenance problems in her home (water leaks for 2 years). Patient thinks her sleep is okay. She gets 6-7 hours of sleep. She does not feel well rested. She thinks she snores. She has never had a sleep study.  She denies any constitutional symptoms like fever, night sweats, anorexia or unintentional weight loss. She does endorse fluctuating being hot then cold.  EtOH use: glass of wine every few days  Restrictive diet? No, endorses a non-healthy diet Family history of neuropathy/myopathy/neurologic disease? No  She has never had an EMG. She  has never been on medication for her symptoms. She does not like medications. She does take Alleve that helps her foot symptoms.   MEDICATIONS:  Outpatient Encounter Medications as of 08/28/2022  Medication Sig   chlorthalidone (HYGROTON) 25 MG tablet TAKE 1 TABLET BY MOUTH DAILY   ferrous sulfate 325 (65 FE) MG tablet Take 1 tablet (325 mg total) by mouth 2 (two) times daily. (Patient taking differently: Take 325 mg by mouth daily.)   naproxen sodium (ALEVE) 220 MG tablet Take 220 mg by mouth. As needed   omeprazole (PRILOSEC) 40 MG capsule TAKE ONE CAPSULE BY MOUTH DAILY   vitamin C (ASCORBIC ACID) 500 MG tablet Take 500 mg by mouth daily.     No facility-administered encounter medications on file as of 08/28/2022.    PAST MEDICAL HISTORY: Past Medical History:  Diagnosis Date   Anemia    Anxiety and depression    during loss of husband   Arthritis    Diverticulosis    GERD (gastroesophageal reflux disease)    GI bleed    hx of; hgb as low as 5.9   Hemorrhoids    Hiatal hernia    HLD (hyperlipidemia)    Obesity    Osteoarthritis    Pneumonia    Urinary tract bacterial infections     PAST SURGICAL HISTORY: Past Surgical History:  Procedure Laterality Date   ABDOMINAL HYSTERECTOMY     states had a "few cancer cells" but they "got everything". ? ovaries and  cervix.    APPENDECTOMY     HIATAL HERNIA REPAIR      ALLERGIES: Allergies  Allergen Reactions   Nsaids     Should avoid-- GI bleed   Hydrocodone Nausea And Vomiting    FAMILY HISTORY: Family History  Problem Relation Age of Onset   Arthritis Mother    Uterine cancer Mother        uterine   Diabetes Brother        unknown cause   Hypertension Brother    Diabetes Brother        complications after amputation   Breast cancer Maternal Grandmother    Colon cancer Maternal Aunt 96   CAD Brother        medical error as reported cause of death   Lung cancer Brother        youngest   Colon polyps Neg Hx     Esophageal cancer Neg Hx    Stomach cancer Neg Hx     SOCIAL HISTORY: Social History   Tobacco Use   Smoking status: Never   Smokeless tobacco: Never  Vaping Use   Vaping Use: Never used  Substance Use Topics   Alcohol use: Yes    Comment: 1 per every other  month/ wine   Drug use: No   Social History   Social History Narrative   Widowed 03/02/2007. 2 sons. 4 grandkids (2 girls/2 boys). 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 right   Are you currently employed ?    What is your current occupation?retired   Do you live at home alone? yes   Who lives with you?    What type of home do you live in: 1 story or 2 story? two         OBJECTIVE: PHYSICAL EXAM: BP (!) 154/82   Pulse 95   Ht '5\' 1"'$  (1.549 m)   Wt 216 lb 9.6 oz (98.2 kg)   SpO2 96%   BMI 40.93 kg/m   General: General appearance: Awake and alert. No distress. Cooperative with exam.  Skin: No obvious rash or jaundice. HEENT: Atraumatic. Anicteric. Lungs: Non-labored breathing on room air  Extremities: Bilateral peripheral edema in lower extremities (right > left) with pain on palpation Musculoskeletal: No obvious joint swelling. Psych: Affect appropriate.  Neurological: Mental Status: Alert. Speech fluent. No pseudobulbar affect Cranial Nerves: CNII: No RAPD. Visual fields grossly intact. CNIII, IV, VI: PERRL. No nystagmus. EOMI. CN V: Facial sensation intact bilaterally to fine touch. CN VII: Facial muscles symmetric and strong. No ptosis at rest. CN VIII: Hearing grossly intact bilaterally. CN IX: No hypophonia. CN X: Palate elevates symmetrically. CN XI: Full strength shoulder shrug bilaterally. CN XII: Tongue protrusion full and midline. No atrophy or fasciculations. No significant dysarthria Motor: Tone is normal.  Individual muscle group testing (MRC grade out of 5):  Movement     Neck flexion  5    Neck extension 5     Right Left   Shoulder abduction 5 5   Shoulder adduction 5 5   Elbow flexion 5 5   Elbow extension 5 5   Finger extension 5 5   Finger flexion 5 5   Thumb abduction - APB 5 5    Hip flexion 5 5   Hip extension 5 5   Hip adduction 5 5   Hip abduction 5 5   Knee extension 5 5  Knee flexion 5 5   Dorsiflexion 5 5   Plantarflexion 5 5   Great toe extension 5 5   Great toe flexion 5 5     Reflexes:  Right Left   Bicep 2+ 2+   Tricep 2+ 2+   BrRad 2+ 2+   Knee 2+ 2+   Ankle 2+ 2+    Pathological Reflexes: Babinski: flexor response bilaterally Hoffman: absent bilaterally Troemner: absent bilaterally Sensation: Pinprick: Intact in all extremities Vibration: Intact in bilateral upper extremities. Absent at great toes, present at ankles (swelling?) Proprioception: Intact in bilateral great toes Coordination: Intact finger-to- nose-finger bilaterally. Romberg negative. Gait: Able to rise from chair with arms crossed unassisted. Normal, narrow-based gait.  Lab and Test Review: Internal labs: 06/25/22: Normal or unremarkable: CBC, CMP, TSH B12: 465 HbA1c: 6.0 (6.2 in 2009)  Imaging: CT head wo contrast (03/20/21): FINDINGS: Brain: No acute intracranial abnormality. Specifically, no hemorrhage, hydrocephalus, mass lesion, acute infarction, or significant intracranial injury.   Vascular: No hyperdense vessel or unexpected calcification.   Skull: No acute calvarial abnormality.   Sinuses/Orbits: No acute findings   Other: None   IMPRESSION: Normal study.  ASSESSMENT: Shelly Johnson is a 77 y.o. female who presents for evaluation of diffuse pain, most apparent in feet (right > left). She has a relevant medical history of HLD, OA, anxiety, and depression. Her neurological examination is pertinent for pain to palpation in all limbs, legs more significant than arms and ?diminished sensation to vibration in feet. Available diagnostic data is  significant for HbA1c of 6, B12 465. Patient's symptoms are likely multifactorial with contributions from arthritis and lower extremity edema. Her rather benign neurologic examination with intact reflexes, even at the ankles, argues against a significant neuropathy that would cause such pain. She may also have a chronic pain syndrome such as fibromyalgia or central sensitization that is contributing as well. The significant stress she has been under and her poor sleep are also likely exacerbating her symptoms.  PLAN: -Blood work: IFE, ANA, ENA, Vit D (had to reassure patient as she does not like needles) -Discussed EMG, but patient deferred (does not like needles) -Sleep medicine referral for poor sleep and possible sleep study -Discussed Cymbalta for symptomatic treatment. Patient to consider and let me know if she would like to try it. I would start low at 30 mg at night.  -Return to clinic in 5 months  The impression above as well as the plan as outlined below were extensively discussed with the patient who voiced understanding. All questions were answered to their satisfaction.  When available, results of the above investigations and possible further recommendations will be communicated to the patient via telephone/MyChart. Patient to call office if not contacted after expected testing turnaround time.   Total time spent reviewing records, interview, history/exam, documentation, and coordination of care on day of encounter:  70 min   Thank you for allowing me to participate in patient's care.  If I can answer any additional questions, I would be pleased to do so.  Kai Levins, MD   CC: [address], [place] [address]  CC: Referring provider: [name], [address],  [place] [address]

## 2022-08-28 ENCOUNTER — Other Ambulatory Visit (INDEPENDENT_AMBULATORY_CARE_PROVIDER_SITE_OTHER): Payer: Medicare Other

## 2022-08-28 ENCOUNTER — Encounter: Payer: Self-pay | Admitting: Neurology

## 2022-08-28 ENCOUNTER — Ambulatory Visit (INDEPENDENT_AMBULATORY_CARE_PROVIDER_SITE_OTHER): Payer: Medicare Other | Admitting: Neurology

## 2022-08-28 VITALS — BP 154/82 | HR 95 | Ht 61.0 in | Wt 216.6 lb

## 2022-08-28 DIAGNOSIS — R5383 Other fatigue: Secondary | ICD-10-CM | POA: Diagnosis not present

## 2022-08-28 DIAGNOSIS — R209 Unspecified disturbances of skin sensation: Secondary | ICD-10-CM

## 2022-08-28 DIAGNOSIS — E559 Vitamin D deficiency, unspecified: Secondary | ICD-10-CM

## 2022-08-28 DIAGNOSIS — R200 Anesthesia of skin: Secondary | ICD-10-CM

## 2022-08-28 DIAGNOSIS — Z72820 Sleep deprivation: Secondary | ICD-10-CM

## 2022-08-28 DIAGNOSIS — R52 Pain, unspecified: Secondary | ICD-10-CM

## 2022-08-28 LAB — VITAMIN D 25 HYDROXY (VIT D DEFICIENCY, FRACTURES): VITD: 22.91 ng/mL — ABNORMAL LOW (ref 30.00–100.00)

## 2022-08-28 NOTE — Patient Instructions (Signed)
You were seen today for pain and numbness in feet. Your neurologic examination looks good. I am not sure the cause of your symptoms currently. To further evaluate, I would like to get a little more blood work today. I will be in touch about the results.  I would also like to refer you to our sleep medicine specialists to discuss your sleep and if a sleep study might be needed.  We discussed treatment with a medication called Cymbalta. You wanted to look it up and decide later if you wanted to try it. The main side effects that I hear about are sedation, nausea, and dry mouth. If you decide you want to try a low dose of this medication, let me know.  I would like to see you back in clinic in about 5 months.  Please let me know if you have any questions or concerns in the meantime.  The physicians and staff at [facility] are committed to providing excellent care. You may receive a survey requesting feedback about your experience at our office. We strive to receive "very good" responses to the survey questions. If you feel that your experience would prevent you from giving the office a "very good " response, please contact our office to try to remedy the situation. We may be reached at [phone]. Thank you for taking the time out of your busy day to complete the survey.  Kai Levins, MD

## 2022-08-29 LAB — ANA+ENA+DNA/DS+SCL 70+SJOSSA/B
ANA Titer 1: NEGATIVE
ENA RNP Ab: <0.2 AI (ref 0.0–0.9)
ENA SM Ab Ser-aCnc: <0.2 AI (ref 0.0–0.9)
ENA SSA (RO) Ab: <0.2 AI (ref 0.0–0.9)
ENA SSB (LA) Ab: <0.2 AI (ref 0.0–0.9)
Scleroderma (Scl-70) (ENA) Antibody, IgG: <0.2 AI (ref 0.0–0.9)
dsDNA Ab: <1 IU/mL (ref 0–9)

## 2022-09-01 ENCOUNTER — Telehealth: Payer: Self-pay | Admitting: Anesthesiology

## 2022-09-01 NOTE — Telephone Encounter (Signed)
Pt left message stating she would like to get her recent lab results.

## 2022-09-02 NOTE — Telephone Encounter (Signed)
Patient missed a phone call from our office and returned call  she left it on the VM

## 2022-09-02 NOTE — Telephone Encounter (Signed)
Called Pt and gave her information of results per Dr. Berdine Addison. She understood.

## 2022-09-02 NOTE — Telephone Encounter (Signed)
Called patient and left a message for a call back

## 2022-09-04 LAB — IMMUNOFIXATION ELECTROPHORESIS
IgG (Immunoglobin G), Serum: 1029 mg/dL (ref 600–1540)
IgM, Serum: 82 mg/dL (ref 50–300)
Immunoglobulin A: 159 mg/dL (ref 70–320)

## 2022-09-16 ENCOUNTER — Encounter: Payer: Self-pay | Admitting: Family Medicine

## 2022-09-16 DIAGNOSIS — E559 Vitamin D deficiency, unspecified: Secondary | ICD-10-CM | POA: Insufficient documentation

## 2022-11-10 ENCOUNTER — Other Ambulatory Visit: Payer: Self-pay | Admitting: Family Medicine

## 2022-12-08 NOTE — Patient Instructions (Signed)
  Sleep apnea is defined as period of 10 seconds or longer when you stop breathing at night. This can happen multiple times a night. Dx sleep apnea is when this occurs more than 5 times an hour.    Mild OSA 5-15 apneic events an hour Moderate OSA 15-30 apneic events an hour Severe OSA > 30 apneic events an hour   Untreated sleep apnea puts you at higher risk for cardiac arrhythmias, pulmonary HTN, stroke and diabetes  Treatment options include weight loss, side sleeping position, oral appliance, CPAP therapy or referral to ENT for possible surgical options    Recommendations: Focus on side sleeping position or elevate head with wedge pillow 30 degrees Do not drive if experiencing excessive daytime sleepiness of fatigue  Work on weight loss efforts if able    Orders: Home sleep study re: loud snoring (ordered)  CXR re: shortness of breath (ordered)   Follow-up: Please call to schedule follow-up 1-2 weeks after completing home sleep study to review results and treatment if needed (can be virtual)

## 2022-12-08 NOTE — Progress Notes (Unsigned)
  ID: Shelly Johnson, female    DOB: February 20, 1946, 77 y.o.   MRN: [id number]  No chief complaint on file.   Referring provider: Antony Madura, MD  HPI: 77 year old female, never smoked.  Past medical history significant for hypertension, hiatal hernia, hyperlipidemia, hyperglycemia, vitamin D deficiency,  anemia, morbid obesity.  12/09/2022 Patient presents today for sleep consult.     Sleep questionnaire Symptoms-    Prior sleep study-  Bedtime- Time to fall asleep-  Nocturnal awakenings-  Out of bed/start of day-  Weight changes-  Do you operate heavy machinery-  Do you currently wear CPAP-  Do you current wear oxygen-  [place]-     Allergies  Allergen Reactions   Nsaids     Should avoid-- GI bleed   Hydrocodone Nausea And Vomiting    Immunization History  Administered Date(s) Administered   Fluad Quad(high Dose 65+) 06/04/2020, 05/30/2021, 06/25/2022   Influenza, High Dose Seasonal PF 07/04/2016, 06/19/2017, 04/28/2018   Influenza,inj,Quad PF,6+ Mos 07/18/2013, 04/25/2014   Janssen (J&J) SARS-COV-2 Vaccination 02/09/2020   Pneumococcal Conjugate-13 07/18/2013, 04/03/2015   Pneumococcal Polysaccharide-23 07/04/2016   Tdap 07/21/2013    Past Medical History:  Diagnosis Date   Anemia    Anxiety and depression    during loss of husband   Arthritis    Diverticulosis    GERD (gastroesophageal reflux disease)    GI bleed    hx of; hgb as low as 5.9   Hemorrhoids    Hiatal hernia    HLD (hyperlipidemia)    Obesity    Osteoarthritis    Pneumonia    Urinary tract bacterial infections     Tobacco History: Social History   Tobacco Use  Smoking Status Never  Smokeless Tobacco Never   Counseling given: Not Answered   Outpatient Medications Prior to Visit  Medication Sig Dispense Refill   chlorthalidone (HYGROTON) 25 MG tablet TAKE 1 TABLET BY MOUTH DAILY 90 tablet 2   ferrous sulfate 325 (65 FE) MG tablet Take 1 tablet (325 mg total) by  mouth 2 (two) times daily. (Patient taking differently: Take 325 mg by mouth daily.) 60 tablet 3   naproxen sodium (ALEVE) 220 MG tablet Take 220 mg by mouth. As needed     omeprazole (PRILOSEC) 40 MG capsule TAKE ONE CAPSULE BY MOUTH DAILY 90 capsule 2   vitamin C (ASCORBIC ACID) 500 MG tablet Take 500 mg by mouth daily.       No facility-administered medications prior to visit.      Review of Systems  Review of Systems   Physical Exam  There were no vitals taken for this visit. Physical Exam   Lab Results:  CBC    Component Value Date/Time   WBC 5.6 06/25/2022 1149   RBC 4.95 06/25/2022 1149   HGB 15.2 (H) 06/25/2022 1149   HCT 46.0 06/25/2022 1149   PLT 191.0 06/25/2022 1149   MCV 92.9 06/25/2022 1149   MCH 30.5 03/20/2021 1208   MCHC 33.0 06/25/2022 1149   RDW 13.9 06/25/2022 1149   LYMPHSABS 2.0 06/25/2022 1149   MONOABS 0.3 06/25/2022 1149   EOSABS 0.1 06/25/2022 1149   BASOSABS 0.0 06/25/2022 1149    BMET    Component Value Date/Time   NA 141 06/25/2022 1149   K 3.7 06/25/2022 1149   CL 100 06/25/2022 1149   CO2 31 06/25/2022 1149   GLUCOSE 95 06/25/2022 1149   BUN 14 06/25/2022 1149   CREATININE 0.80 06/25/2022  1149   CREATININE 0.86 06/04/2020 1538   CALCIUM 9.3 06/25/2022 1149   GFRNONAA >60 03/20/2021 1208   GFRNONAA 67 06/04/2020 1538   GFRAA 78 06/04/2020 1538    BNP No results found for: "BNP"  ProBNP No results found for: "PROBNP"  Imaging: No results found.   Assessment & Plan:   No problem-specific Assessment & Plan notes found for this encounter.     Glenford Bayley, NP 12/08/2022

## 2022-12-09 ENCOUNTER — Ambulatory Visit (INDEPENDENT_AMBULATORY_CARE_PROVIDER_SITE_OTHER): Payer: Medicare Other | Admitting: Primary Care

## 2022-12-09 ENCOUNTER — Institutional Professional Consult (permissible substitution): Payer: Medicare Other | Admitting: Internal Medicine

## 2022-12-09 ENCOUNTER — Encounter: Payer: Self-pay | Admitting: Primary Care

## 2022-12-09 ENCOUNTER — Ambulatory Visit (INDEPENDENT_AMBULATORY_CARE_PROVIDER_SITE_OTHER): Payer: Medicare Other

## 2022-12-09 VITALS — BP 124/78 | HR 98 | Temp 98.0°F | Ht 62.0 in | Wt 214.6 lb

## 2022-12-09 DIAGNOSIS — K449 Diaphragmatic hernia without obstruction or gangrene: Secondary | ICD-10-CM | POA: Diagnosis not present

## 2022-12-09 DIAGNOSIS — R0602 Shortness of breath: Secondary | ICD-10-CM

## 2022-12-09 DIAGNOSIS — R0600 Dyspnea, unspecified: Secondary | ICD-10-CM | POA: Diagnosis not present

## 2022-12-09 DIAGNOSIS — R0609 Other forms of dyspnea: Secondary | ICD-10-CM

## 2022-12-09 DIAGNOSIS — R0683 Snoring: Secondary | ICD-10-CM | POA: Insufficient documentation

## 2022-12-09 NOTE — Progress Notes (Signed)
Reviewed and agree with assessment/plan.   Beverlie Kurihara, MD [facility] 12/09/2022, 2:03 PM Pager:  [phone]  

## 2022-12-09 NOTE — Assessment & Plan Note (Addendum)
-   Patient has snoring symptoms along with daytime fatigue. Moderate suspicion patient has underlying sleep apnea, needs home sleep study to evaluate.  Reviewed risks of untreated sleep apnea including cardiac rhythms, pulmonary hypertension, diabetes and stroke.  Also discussed treatment options including weight loss, oral appliance, cpap therapy or referral ENT for possible surgical options. Encourage side sleeping position. Advised against driving if experiencing excessive daytime sleepiness. Follow-up 1-2 weeks after sleep study to review results and treatment options if needed.

## 2022-12-09 NOTE — Assessment & Plan Note (Signed)
-   Never smoked. No history of asthma. Dyspnea occurs with moderate exertion. No symptoms at rest. No associated chest tightness/wheezing. Shortness of breath likely related to obesity and deconditioning. She does have large hiatal hernia and grade 1 diastolic dysfunction on echocardiogram back in 2018 which could be contributing. Recommend getting updated CXR. Patient declined labs today. If symptoms remain persistent consider getting PFTs.

## 2022-12-15 NOTE — Progress Notes (Signed)
Please let patient know chest x-ray showed no acute cardiopulmonary disease.  She does have a large hiatal hernia that could be causing her shortness of breath, refer to GI

## 2022-12-16 ENCOUNTER — Telehealth: Payer: Self-pay | Admitting: Primary Care

## 2022-12-16 NOTE — Telephone Encounter (Signed)
Patient would like the nurse to call with her chest x-ray results.  Please advise and call patient to discuss at [phone]

## 2022-12-16 NOTE — Telephone Encounter (Signed)
Went over chest xray results with patient. She is aware of Hiatal hernia. She has a GI physican she sees

## 2022-12-18 ENCOUNTER — Other Ambulatory Visit: Payer: Self-pay | Admitting: *Deleted

## 2022-12-18 DIAGNOSIS — K449 Diaphragmatic hernia without obstruction or gangrene: Secondary | ICD-10-CM

## 2023-01-19 ENCOUNTER — Ambulatory Visit (INDEPENDENT_AMBULATORY_CARE_PROVIDER_SITE_OTHER): Payer: Medicare Other

## 2023-01-19 ENCOUNTER — Ambulatory Visit (INDEPENDENT_AMBULATORY_CARE_PROVIDER_SITE_OTHER): Payer: Medicare Other | Admitting: Podiatry

## 2023-01-19 DIAGNOSIS — M2041 Other hammer toe(s) (acquired), right foot: Secondary | ICD-10-CM

## 2023-01-19 NOTE — Progress Notes (Signed)
Chief Complaint  Patient presents with   Hammer Toe    Patient came in today for right foot 2nd toe hammertoe, toes on the right foot are also numb, bilateral nail fungus,  swelling on the medial side of the right ankle, rate of pain 6 out of 10, X-Rays done today    HPI: 77 y.o. female presenting today for new complaint of pain and tenderness associated to the right second toe ongoing for several months now.  Patient last seen in the office 08/01/2020 for unrelated issues.  Patient states that she has noticed over the last several years she has developed a symptomatic hammertoe to the right second toe.  Gradual onset.  Denies any history of injury.  Most recently over the past several months she has had increased pain and tenderness associated to the toe.  She has tried different shoe gear modifications and offloading techniques but it seems that all of her shoes are painful and symptomatic and rub the hammertoe of the digit.  Presenting for further treatment and evaluation  Past Medical History:  Diagnosis Date   Anemia    Anxiety and depression    during loss of husband   Arthritis    Diverticulosis    GERD (gastroesophageal reflux disease)    GI bleed    hx of; hgb as low as 5.9   Hemorrhoids    Hiatal hernia    HLD (hyperlipidemia)    Obesity    Osteoarthritis    Pneumonia    Urinary tract bacterial infections       Objective: Physical Exam General: The patient is alert and oriented x3 in no acute distress.  Dermatology: Skin is cool, dry and supple bilateral lower extremities. Negative for open lesions or macerations.  Vascular: Palpable pedal pulses bilaterally. No edema or erythema noted. Capillary refill within normal limits.  Neurological: Grossly intact via light touch  Musculoskeletal Exam: All pedal and ankle joints range of motion within normal limits bilateral. Muscle strength 5/5 in all groups bilateral. Hammertoe contracture deformity noted to digits #2 of  the right foot.  Radiographic Exam RT foot 01/19/2023: Hammertoe contracture deformity noted to the interphalangeal joints and MPJ of the respective hammertoe digits mentioned on clinical musculoskeletal exam.     Assessment: 1.  Symptomatic hammertoe second digit right foot   Plan of Care:  -Patient evaluated. X-Rays reviewed.  -Today we had a discussion regarding both conservative and surgical management for hammertoe deformity.  The patient states that she has a lot planned for the summer and for the moment she would like to pursue conservative treatment however she would like to consider surgery at the end of the summer. -Recommend wide fitting shoes that allow plenty of room in the toebox area and do not constrict or irritate the hammertoe -Darco toe splint was dispensed and applied to the hammertoe to help alleviate the contracture when wearing closed toed shoes. -Return to clinic in 3 months for possible surgical consult and follow-up  Felecia Shelling, DPM [facility]  Dr. Felecia Shelling, DPM    [address] [place], [place] [address]                Office [phone]  Fax [phone])  [phone]      

## 2023-01-29 ENCOUNTER — Ambulatory Visit: Payer: Medicare Other | Admitting: Neurology

## 2023-02-13 ENCOUNTER — Other Ambulatory Visit: Payer: Self-pay | Admitting: Podiatry

## 2023-02-13 DIAGNOSIS — M2041 Other hammer toe(s) (acquired), right foot: Secondary | ICD-10-CM

## 2023-02-26 ENCOUNTER — Other Ambulatory Visit: Payer: Self-pay | Admitting: Family Medicine

## 2023-03-06 ENCOUNTER — Ambulatory Visit: Payer: Medicare Other | Admitting: Primary Care

## 2023-03-06 DIAGNOSIS — R0683 Snoring: Secondary | ICD-10-CM

## 2023-03-06 DIAGNOSIS — G4733 Obstructive sleep apnea (adult) (pediatric): Secondary | ICD-10-CM

## 2023-03-09 ENCOUNTER — Encounter: Payer: Self-pay | Admitting: Internal Medicine

## 2023-03-10 DIAGNOSIS — G4733 Obstructive sleep apnea (adult) (pediatric): Secondary | ICD-10-CM | POA: Diagnosis not present

## 2023-03-11 NOTE — Progress Notes (Signed)
Sleep study showed severe OSA, she had average 30 events an hour. Needs virtual visit to discuss best treatment. If no openings, can double book on a day that I am not already.

## 2023-03-19 ENCOUNTER — Emergency Department (HOSPITAL_BASED_OUTPATIENT_CLINIC_OR_DEPARTMENT_OTHER)
Admission: EM | Admit: 2023-03-19 | Discharge: 2023-03-20 | Disposition: A | Discharge status: Home / Self Care | Payer: Medicare Other | Attending: Emergency Medicine | Admitting: Emergency Medicine

## 2023-03-19 ENCOUNTER — Telehealth: Payer: Self-pay | Admitting: Family Medicine

## 2023-03-19 ENCOUNTER — Emergency Department (HOSPITAL_BASED_OUTPATIENT_CLINIC_OR_DEPARTMENT_OTHER): Payer: Medicare Other

## 2023-03-19 ENCOUNTER — Encounter (HOSPITAL_BASED_OUTPATIENT_CLINIC_OR_DEPARTMENT_OTHER): Payer: Self-pay | Admitting: Emergency Medicine

## 2023-03-19 ENCOUNTER — Emergency Department (HOSPITAL_BASED_OUTPATIENT_CLINIC_OR_DEPARTMENT_OTHER): Payer: Medicare Other | Admitting: Radiology

## 2023-03-19 ENCOUNTER — Other Ambulatory Visit: Payer: Self-pay

## 2023-03-19 DIAGNOSIS — Z79899 Other long term (current) drug therapy: Secondary | ICD-10-CM | POA: Insufficient documentation

## 2023-03-19 DIAGNOSIS — K449 Diaphragmatic hernia without obstruction or gangrene: Secondary | ICD-10-CM | POA: Diagnosis not present

## 2023-03-19 DIAGNOSIS — U071 COVID-19: Secondary | ICD-10-CM | POA: Insufficient documentation

## 2023-03-19 DIAGNOSIS — J181 Lobar pneumonia, unspecified organism: Secondary | ICD-10-CM | POA: Diagnosis not present

## 2023-03-19 DIAGNOSIS — J1282 Pneumonia due to coronavirus disease 2019: Secondary | ICD-10-CM | POA: Diagnosis present

## 2023-03-19 DIAGNOSIS — I2510 Atherosclerotic heart disease of native coronary artery without angina pectoris: Secondary | ICD-10-CM | POA: Diagnosis not present

## 2023-03-19 DIAGNOSIS — R918 Other nonspecific abnormal finding of lung field: Secondary | ICD-10-CM | POA: Diagnosis not present

## 2023-03-19 DIAGNOSIS — R0602 Shortness of breath: Secondary | ICD-10-CM | POA: Diagnosis not present

## 2023-03-19 DIAGNOSIS — J9811 Atelectasis: Secondary | ICD-10-CM | POA: Diagnosis not present

## 2023-03-19 DIAGNOSIS — J189 Pneumonia, unspecified organism: Secondary | ICD-10-CM

## 2023-03-19 DIAGNOSIS — R9431 Abnormal electrocardiogram [ECG] [EKG]: Secondary | ICD-10-CM | POA: Diagnosis not present

## 2023-03-19 DIAGNOSIS — E876 Hypokalemia: Secondary | ICD-10-CM | POA: Diagnosis not present

## 2023-03-19 LAB — LACTIC ACID, PLASMA: Lactic Acid, Venous: 0.9 mmol/L (ref 0.5–1.9)

## 2023-03-19 LAB — BASIC METABOLIC PANEL
Anion gap: 10 (ref 5–15)
BUN: 11 mg/dL (ref 8–23)
CO2: 34 mmol/L — ABNORMAL HIGH (ref 22–32)
Calcium: 9.3 mg/dL (ref 8.9–10.3)
Chloride: 95 mmol/L — ABNORMAL LOW (ref 98–111)
Creatinine, Ser: 0.78 mg/dL (ref 0.44–1.00)
GFR, Estimated: >60 mL/min (ref >60)
Glucose, Bld: 102 mg/dL — ABNORMAL HIGH (ref 70–99)
Potassium: 3.3 mmol/L — ABNORMAL LOW (ref 3.5–5.1)
Sodium: 139 mmol/L (ref 135–145)

## 2023-03-19 LAB — CBC WITH DIFFERENTIAL/PLATELET
Abs Immature Granulocytes: 0.02 10*3/uL (ref 0.00–0.07)
Basophils Absolute: 0 10*3/uL (ref 0.0–0.1)
Basophils Relative: 0 %
Eosinophils Absolute: 0 10*3/uL (ref 0.0–0.5)
Eosinophils Relative: 0 %
HCT: 45.3 % (ref 36.0–46.0)
Hemoglobin: 15.4 g/dL — ABNORMAL HIGH (ref 12.0–15.0)
Immature Granulocytes: 0 %
Lymphocytes Relative: 18 %
Lymphs Abs: 1.3 10*3/uL (ref 0.7–4.0)
MCH: 31 pg (ref 26.0–34.0)
MCHC: 34 g/dL (ref 30.0–36.0)
MCV: 91.1 fL (ref 80.0–100.0)
Monocytes Absolute: 0.5 10*3/uL (ref 0.1–1.0)
Monocytes Relative: 6 %
Neutro Abs: 5.5 10*3/uL (ref 1.7–7.7)
Neutrophils Relative %: 76 %
Platelets: 178 10*3/uL (ref 150–400)
RBC: 4.97 MIL/uL (ref 3.87–5.11)
RDW: 13.1 % (ref 11.5–15.5)
WBC: 7.3 10*3/uL (ref 4.0–10.5)
nRBC: 0 % (ref 0.0–0.2)

## 2023-03-19 LAB — RESP PANEL BY RT-PCR (RSV, FLU A&B, COVID)  RVPGX2
Influenza A by PCR: NEGATIVE
Influenza B by PCR: NEGATIVE
Resp Syncytial Virus by PCR: NEGATIVE
SARS Coronavirus 2 by RT PCR: POSITIVE — AB

## 2023-03-19 LAB — BRAIN NATRIURETIC PEPTIDE: B Natriuretic Peptide: 45.4 pg/mL (ref 0.0–100.0)

## 2023-03-19 LAB — TROPONIN I (HIGH SENSITIVITY): Troponin I (High Sensitivity): 4 ng/L (ref <18)

## 2023-03-19 MED ORDER — DEXAMETHASONE SODIUM PHOSPHATE 10 MG/ML IJ SOLN
6.0000 mg | Freq: Once | INTRAMUSCULAR | Status: AC
  Administered 2023-03-19: 6 mg via INTRAVENOUS
  Filled 2023-03-19: qty 1

## 2023-03-19 MED ORDER — SODIUM CHLORIDE 0.9 % IV SOLN
1.0000 g | Freq: Once | INTRAVENOUS | Status: AC
  Administered 2023-03-19: 1 g via INTRAVENOUS
  Filled 2023-03-19: qty 10

## 2023-03-19 MED ORDER — IOHEXOL 350 MG/ML SOLN
100.0000 mL | Freq: Once | INTRAVENOUS | Status: AC | PRN
  Administered 2023-03-19: 75 mL via INTRAVENOUS

## 2023-03-19 MED ORDER — SODIUM CHLORIDE 0.9 % IV SOLN
500.0000 mg | Freq: Once | INTRAVENOUS | Status: AC
  Administered 2023-03-20: 500 mg via INTRAVENOUS
  Filled 2023-03-19: qty 5

## 2023-03-19 MED ORDER — SODIUM CHLORIDE 0.9 % IV SOLN: 100.0000 mg | Freq: Every day | INTRAVENOUS | Status: DC

## 2023-03-19 MED ORDER — SODIUM CHLORIDE 0.9 % IV SOLN
100.0000 mg | INTRAVENOUS | Status: AC
  Administered 2023-03-20 (×2): 100 mg via INTRAVENOUS

## 2023-03-19 MED ORDER — POTASSIUM CHLORIDE CRYS ER 20 MEQ PO TBCR
40.0000 meq | EXTENDED_RELEASE_TABLET | Freq: Once | ORAL | Status: AC
  Administered 2023-03-19: 40 meq via ORAL
  Filled 2023-03-19: qty 2

## 2023-03-19 MED ORDER — ACETAMINOPHEN 500 MG PO TABS
1000.0000 mg | ORAL_TABLET | Freq: Once | ORAL | Status: AC
  Administered 2023-03-19: 1000 mg via ORAL
  Filled 2023-03-19: qty 2

## 2023-03-19 NOTE — Telephone Encounter (Signed)
Patient Advised: Go to ED Now   Patient Name First: Shelly Last: Johnson Gender: Female DOB: 06/12/1946 Age: 77 Y 2 M 2 D Return Phone Number: [phone] (Primary) Address: City/ State/ Zip: [place]  [address] Client [facility], [facility] Provider Tana Conch- MD Contact Type Call Who Is Calling Patient / Member / Family / Caregiver Call Type Triage / Clinical Relationship To Patient Self Return Phone Number [phone] (Primary) Chief Complaint BREATHING - shortness of breath or sounds breathless Reason for Call Symptomatic / Request for Health Information Initial Comment Caller took home test and may have covid she has symptoms of cough green and yellow phlem, difficulty breathing when bending over she gets lightheaded and dizzy. Translation No Nurse Assessment Nurse: Annye English, RN, Denise Date/Time (Eastern Time): 03/19/2023 1:36:14 PM Confirm and document reason for call. If symptomatic, describe symptoms. ---Pt has Covid w/cough, diff breathing, nasal congestion, sore throat. Does the patient have any new or worsening symptoms? ---Yes Will a triage be completed? ---Yes Related visit to physician within the last 2 weeks? ---No Does the PT have any chronic conditions? (i.e. diabetes, asthma, this includes High risk factors for pregnancy, etc.) ---No Is this a behavioral health or substance abuse call? ---No Guidelines Guideline Title Affirmed Question Affirmed Notes Nurse Date/Time (Eastern Time) COVID-19 - Diagnosed or Suspected SEVERE or constant chest pain or pressure (Exception: Mild central chest pain, present only when coughing.) Carmon, RN, Denise 03/19/2023 1:37:17 PM  Disp. Time Lamount Cohen Time) Disposition Final User 03/19/2023 1:29:15 PM Send to Urgent Queue Back, Jomarie Longs 03/19/2023 1:30:54 PM Attempt made - message left Carmon, RN, Denise 03/19/2023 1:34:57 PM  Send to Urgent Queue Dennison Mascot 03/19/2023 1:41:06 PM Go to ED Now Yes Annye English, RN, Angelique Blonder Final Disposition 03/19/2023 1:41:06 PM Go to ED Now Yes Carmon, RN, Leighton Ruff Disagree/Comply Comply Caller Understands Yes PreDisposition Call Doctor Care Advice Given Per Guideline GO TO ED NOW: WEAR A MASK - COVER YOUR MOUTH AND NOSE: * Wear a mask that fits snuggly over your mouth and nose. TELL HEALTHCARE PERSONNEL THAT YOU MIGHT HAVE COVID-19: * Tell the first healthcare worker you meet that you may have COVID-19. ANOTHER ADULT SHOULD DRIVE: * It is better and safer if another adult drives instead of you. CARE ADVICE given per COVID-19 - DIAGNOSED OR SUSPECTED (Adult) guideline.  Referrals [facility] - ED

## 2023-03-19 NOTE — ED Notes (Signed)
Updated. EDPA into room.

## 2023-03-19 NOTE — ED Notes (Signed)
Pt placed on [place] 2 Lpm for desaturation during O2 challenge. Pt respiratory status stable w/sats of 95% with increased WOB and SOB at rest. RT will continue to monitor while at [facility].   03/19/23 2107  Therapy Vitals  Pulse Rate 90  Resp (!) 28  BP (!) 99/57  Patient Position (if appropriate) Lying  MEWS Score/Color  MEWS Score 3  MEWS Score Color Yellow  Respiratory Assessment  Assessment Type Assess only  Respiratory Pattern Regular;Labored;Dyspnea at rest;Dyspnea with exertion;Symmetrical  Chest Assessment Chest expansion symmetrical  Cough None  Bilateral Breath Sounds Clear;Diminished  R Upper  Breath Sounds Clear;Diminished  L Upper Breath Sounds Clear;Diminished  R Lower Breath Sounds Diminished  L Lower Breath Sounds Diminished  Oxygen Therapy/Pulse Ox  O2 Device (S)  Nasal Cannula (post ambulation desaturation placement)  O2 Therapy (S)  Oxygen  O2 Flow Rate (L/min) (S)  2 L/min  FiO2 (%) (S)  28 %  SpO2 93 %

## 2023-03-19 NOTE — ED Provider Notes (Signed)
[place] EMERGENCY DEPARTMENT AT [facility] Provider Note   CSN: [id number] Arrival date & time: 03/19/23  1526     History  Chief Complaint  Patient presents with   Shortness of Breath    Shelly Johnson is a 77 y.o. female with a PMHx of HLD who presents to the ED with concerns for shortness of breath onset last night. Has associated fatigue, chest heaviness, generalized body aches, generalized weakness, cough, headache, sore throat, nasal congestion. Tried Coricidin, afrin, saline nasal spray.  Denies rhinorrhea. Denies sick contacts. Denies asthma, CHF, or COPD. Denies PMHx of MI, DM, HTN, CAD, family history of MI in someone younger than age 53, stents. Pt denies recent travel, immobilization, surgery, estrogen use, or PMHx of PE/DVT. No anticoagulant.   The history is provided by the patient. No language interpreter was used.       Home Medications Prior to Admission medications   Medication Sig Start Date End Date Taking? Authorizing Provider  chlorthalidone (HYGROTON) 25 MG tablet TAKE 1 TABLET BY MOUTH DAILY 02/26/23   Shelva Majestic, MD  ferrous sulfate 325 (65 FE) MG tablet Take 1 tablet (325 mg total) by mouth 2 (two) times daily. Patient taking differently: Take 325 mg by mouth daily. 06/27/14   Hilarie Fredrickson, MD  naproxen sodium (ALEVE) 220 MG tablet Take 220 mg by mouth. As needed    [provider]  omeprazole (PRILOSEC) 40 MG capsule TAKE ONE CAPSULE BY MOUTH DAILY 11/10/22   Shelva Majestic, MD  vitamin C (ASCORBIC ACID) 500 MG tablet Take 500 mg by mouth daily.      [provider]  VITAMIN D PO Take 1 tablet by mouth daily.    [provider]      Allergies    Nsaids and Hydrocodone    Review of Systems   Review of Systems  Respiratory:  Positive for shortness of breath.   All other systems reviewed and are negative.   Physical Exam Updated Vital Signs BP (!) 141/121 (BP Location: Right Arm)   Pulse (!) 103    Temp 99.3 F (37.4 C) (Temporal)   Resp 20   SpO2 93%  Physical Exam Vitals and nursing note reviewed.  Constitutional:      General: She is not in acute distress.    Appearance: Normal appearance.  Eyes:     General: No scleral icterus.    Extraocular Movements: Extraocular movements intact.  Cardiovascular:     Rate and Rhythm: Normal rate.  Pulmonary:     Effort: Pulmonary effort is normal. No respiratory distress.     Comments: Able to speak in clear complete sentences.  Abdominal:     Palpations: Abdomen is soft. There is no mass.     Tenderness: There is no abdominal tenderness.  Musculoskeletal:        General: Normal range of motion.     Cervical back: Neck supple.  Skin:    General: Skin is warm and dry.     Findings: No rash.  Neurological:     Mental Status: She is alert.     Sensory: Sensation is intact.     Motor: Motor function is intact.  Psychiatric:        Behavior: Behavior normal.     ED Results / Procedures / Treatments   Labs (all labs ordered are listed, but only abnormal results are displayed) Labs Reviewed  RESP PANEL BY RT-PCR (RSV, FLU A&B, COVID)  RVPGX2 - Abnormal; Notable for the following components:      Result Value   SARS Coronavirus 2 by RT PCR POSITIVE (*)    All other components within normal limits  BASIC METABOLIC PANEL - Abnormal; Notable for the following components:   Potassium 3.3 (*)    Chloride 95 (*)    CO2 34 (*)    Glucose, Bld 102 (*)    All other components within normal limits  CBC WITH DIFFERENTIAL/PLATELET - Abnormal; Notable for the following components:   Hemoglobin 15.4 (*)    All other components within normal limits  CULTURE, BLOOD (ROUTINE X 2)  CULTURE, BLOOD (ROUTINE X 2)  BRAIN NATRIURETIC PEPTIDE  LACTIC ACID, PLASMA  TROPONIN I (HIGH SENSITIVITY)    EKG EKG Interpretation Date/Time:  Thursday March 19 2023 16:06:48 EDT Ventricular Rate:  106 PR Interval:  138 QRS Duration:  79 QT  Interval:  332 QTC Calculation: 441 R Axis:   52  Text Interpretation: Sinus tachycardia Low voltage, precordial leads when compared to prior, faster rate. No STEMI Confirmed by Theda Belfast ([id number]) on 03/19/2023 5:05:52 PM  Radiology CT Angio Chest PE W and/or Wo Contrast  Result Date: 03/19/2023 CLINICAL DATA:  Pulmonary embolism suspected, high probability. Fatigue, body aches, shortness of breath, weakness, cough, and congestion. COVID positive. EXAM: CT ANGIOGRAPHY CHEST WITH CONTRAST TECHNIQUE: Multidetector CT imaging of the chest was performed using the standard protocol during bolus administration of intravenous contrast. Multiplanar CT image reconstructions and MIPs were obtained to evaluate the vascular anatomy. RADIATION DOSE REDUCTION: This exam was performed according to the departmental dose-optimization program which includes automated exposure control, adjustment of the mA and/or kV according to patient size and/or use of iterative reconstruction technique. CONTRAST:  75mL OMNIPAQUE IOHEXOL 350 MG/ML SOLN COMPARISON:  None Available. FINDINGS: Cardiovascular: The heart is normal in size and there is a trace pericardial effusion. Few scattered coronary artery calcifications are noted. There is atherosclerotic calcification of the aorta without evidence of aneurysm. The pulmonary trunk is normal in caliber. No evidence of pulmonary embolism. Mediastinum/Nodes: No mediastinal, hilar, or axillary lymphadenopathy. There is enlargement of the left lobe of the thyroid gland. The trachea and esophagus are within normal limits. There is a large hiatal hernia containing the stomach. Lungs/Pleura: Atelectasis is noted in the left lower lobe adjacent to the hernia. No effusion or pneumothorax. Few tree-in-bud nodular opacities are noted in the left lower lobe, axial image 74. Upper Abdomen: Fatty infiltration of the liver is noted. No acute abnormality. Musculoskeletal: Degenerative changes are present  in the thoracic spine. No acute osseous abnormality. Review of the MIP images confirms the above findings. IMPRESSION: 1. No evidence of pulmonary embolism. 2. A few scattered tree-in-bud nodular opacities in the right lower lobe, likely infectious or inflammatory. 3. Large hiatal hernia containing the stomach with atelectasis in the left lower lobe. 4. Hepatic steatosis. 5. Aortic atherosclerosis and coronary artery calcifications. Electronically Signed   By: Thornell Sartorius M.D.   On: 03/19/2023 23:25   DG Chest Port 1 View  Result Date: 03/19/2023 CLINICAL DATA:  141880 SOB (shortness of breath) 141880 EXAM: PORTABLE CHEST 1 VIEW COMPARISON:  12/09/2022. FINDINGS: There are atelectatic changes at the left lung base. Bilateral lungs are otherwise clear. Redemonstration of retrocardiac moderate-to-large hiatal hernia. Bilateral costophrenic angles are clear. Stable cardio-mediastinal silhouette. No acute osseous abnormalities. The soft tissues are within normal limits. IMPRESSION: 1. Left basilar atelectasis. 2. Moderate-to-large hiatal hernia. Electronically Signed   By:  Jules Schick M.D.   On: 03/19/2023 16:38    Procedures Procedures    Medications Ordered in ED Medications  cefTRIAXone (ROCEPHIN) 1 g in sodium chloride 0.9 % 100 mL IVPB (has no administration in time range)  azithromycin (ZITHROMAX) 500 mg in sodium chloride 0.9 % 250 mL IVPB (has no administration in time range)  dexamethasone (DECADRON) injection 6 mg (has no administration in time range)  acetaminophen (TYLENOL) tablet 1,000 mg (1,000 mg Oral Given 03/19/23 1814)  potassium chloride SA (KLOR-CON M) CR tablet 40 mEq (40 mEq Oral Given 03/19/23 2029)  iohexol (OMNIPAQUE) 350 MG/ML injection 100 mL (75 mLs Intravenous Contrast Given 03/19/23 2300)    ED Course/ Medical Decision Making/ A&P Clinical Course as of 03/19/23 2354  Thu Mar 19, 2023  1702 SARS Coronavirus 2 by RT PCR(!): POSITIVE [SB]  2049 Pulse ox dropped to 78%  with sitting up in the bed. Placed on 2L via [place]. [SB]  2135 Discussed with patient plans for admission. Pt agreeable at this time.  [SB]  2245 Consult to hospitalist, Dr. Loney Loh who recommends CTA Chest prior to admission. [SB]    Clinical Course User Index [SB] Makarios Madlock A, PA-C                                 Medical Decision Making Amount and/or Complexity of Data Reviewed Labs: ordered. Decision-making details documented in ED Course. Radiology: ordered.  Risk OTC drugs. Prescription drug management. Decision regarding hospitalization.   Patient presents to the ED complaining of shortness of breath onset last night.  Vital signs pt afebrile, tachycardic initially.  Patient does not wear oxygen at baseline.  On exam patient with able to speak in clear complete sentences. No acute cardiovascular, respiratory, abdominal exam findings.  Differential diagnosis includes COVID, flu, RSV, PE, ACS, PTX, PNA.  Labs:  I ordered, and personally interpreted labs.  The pertinent results include:   Positive COVID swab.  Negative Flu and RSV swab BMP with potassium at 3.3, glucose slightly elevated at 102 otherwise unremarkable BNP unremarkable CBC without leukocytosis Troponin at 4 Lactic at 0.9 Blood cultures ordered with results pending at time of admission.   Imaging: I ordered imaging studies including CXR, CTA chest  I independently visualized and interpreted imaging which showed:  1. Left basilar atelectasis.  2. Moderate-to-large hiatal hernia.  CTA chest with  1. No evidence of pulmonary embolism.  2. A few scattered tree-in-bud nodular opacities in the right lower  lobe, likely infectious or inflammatory.  3. Large hiatal hernia containing the stomach with atelectasis in  the left lower lobe.  4. Hepatic steatosis.  5. Aortic atherosclerosis and coronary artery calcifications.   I agree with the radiologist interpretation  Medications:  I ordered medication  including potassium and tylenol for repletion and fever management I have reviewed the patients home medicines and have made adjustments as needed   Consultations: I requested consultation with the Hospitalist, and discussed lab and imaging findings as well as pertinent plan - they recommend: CTA chest prior to admission  Disposition: Presentation suspicious for likely COVID-19 and PNA of RLL. Doubt concerns at this time for PE.  Doubt concerns at this time for flu, RSV, ACS, pneumothorax.  After consideration of the diagnostic results and the patients response to treatment, I feel that the patient would benefit from Admission to the hospital. Discussed with patient plans for admission. Pt agreeable  at this time. Pt appears safe for admission.    This chart was dictated using voice recognition software, Dragon. Despite the best efforts of this provider to proofread and correct errors, errors may still occur which can change documentation meaning.   Final Clinical Impression(s) / ED Diagnoses Final diagnoses:  COVID-19  Pneumonia of right lower lobe due to infectious organism    Rx / DC Orders ED Discharge Orders     None         Kenden Brandt A, PA-C 03/19/23 2354    Tegeler, Canary Brim, MD 03/20/23 [phone]

## 2023-03-19 NOTE — ED Notes (Signed)
Pts vitals during O2 challenge.    03/19/23 2048  Therapy Vitals  Pulse Rate 92  Resp (!) 31  Patient Position (if appropriate) Standing  MEWS Score/Color  MEWS Score 2  MEWS Score Color Yellow  Respiratory Assessment  $ RT Protocol Assessment  Yes  Assessment Type Assess only  Respiratory Pattern Regular;Labored;Dyspnea at rest;Dyspnea with exertion;Symmetrical  Chest Assessment Chest expansion symmetrical  Cough None  Bilateral Breath Sounds Diminished  R Upper  Breath Sounds Diminished  L Upper Breath Sounds Diminished  R Lower Breath Sounds Diminished  L Lower Breath Sounds Diminished  Oxygen Therapy/Pulse Ox  O2 Device Room Air  O2 Therapy Room air  FiO2 (%) 21 %  SpO2 (!) 78 % (during O2 challenge)

## 2023-03-19 NOTE — ED Notes (Signed)
PT insisting this tech not be present for changing into a gown. EKG and COVID Swab held off for this reason.

## 2023-03-19 NOTE — ED Triage Notes (Signed)
Fatigue, body ache, sob weakness. Cough. congestion  +covid at home

## 2023-03-19 NOTE — ED Notes (Signed)
Updated pt on POC. Stood by to let pt get out of bed and stretch and she tolerated that well. Pt back in bed sitting at beside. No other needs at this time. Call light in reach.

## 2023-03-19 NOTE — ED Notes (Signed)
RT had pt perform O2 challenge walk. Pt desaturated from 94% on RA to 77% just standing at bedside. Pt recovers quickly back to 90's. RT will place pt on [place] 2 Lpm. Pt respiratory status is stable but her WOB and SOB are increased with exertion of any kind. MD notified.

## 2023-03-19 NOTE — ED Notes (Signed)
Pt provided water. No other needs at this time. VSS and call light in reach.

## 2023-03-19 NOTE — Progress Notes (Signed)
Plan of Care Note for accepted transfer   Patient: Shelly Johnson MRN: [id number]   DOA: 03/19/2023  Facility requesting transfer: [facility] Requesting Provider: Chestine Spore, PA-C Reason for transfer: Acute hypoxemic respiratory failure secondary to COVID-pneumonia Facility course: 77 year old female with history of hypertension, hyperlipidemia, diverticulosis, GERD, hemorrhoids, history of GI bleed, obesity, osteoarthritis presented to ED with complaints of cough, shortness of breath, chest heaviness, fatigue, generalized weakness, and body aches.  Tested positive for COVID at home.  Oxygen saturation 77% on room air and had increased work of breathing with minimal exertion, sats improved with 2 L [place].  Slightly tachycardic on arrival to the ED but afebrile.  Labs showing no leukocytosis, potassium 3.3, SARS-CoV-2 PCR positive, BNP normal, troponin negative, lactic acid normal, blood cultures collected.  CTA chest showing: "IMPRESSION: 1. No evidence of pulmonary embolism. 2. A few scattered tree-in-bud nodular opacities in the right lower lobe, likely infectious or inflammatory. 3. Large hiatal hernia containing the stomach with atelectasis in the left lower lobe. 4. Hepatic steatosis. 5. Aortic atherosclerosis and coronary artery calcifications."   Medications ordered in the ED include Tylenol, oral potassium 40 mEq, ceftriaxone, azithromycin, and IV Decadron. Pharmacy was consulted for Remdesivir dosing.   Plan of care: The patient is accepted for admission to Progressive unit, at [facility]..   [facility] will assume care on arrival to accepting facility. Until arrival, care as per EDP. However, [facility] available 24/7 for questions and assistance.  Author: John Giovanni, MD 03/19/2023  Check [email or web address] for on-call coverage.  Nursing staff, Please call [facility] Admits & Consults System-Wide number on Amion as soon as patient's arrival, so appropriate admitting provider can  evaluate the pt.

## 2023-03-19 NOTE — Telephone Encounter (Signed)
FYI: This call has been transferred to triage nurse: the Triage Nurse. Once the result note has been entered staff can address the message at that time.  Patient called in with the following symptoms:  Red Word: Difficulty breathing, Can't catch breath. Tested positive for Covid 19 on 03/19/23 (home test-states not sure if she did it right). Fatigue, weak, severe cough, green & yellow  phlegm, when bending over: dizzy and light headed.   Please advise at Mobile [phone] (mobile)  Message is routed to Provider Pool.

## 2023-03-20 ENCOUNTER — Other Ambulatory Visit (HOSPITAL_BASED_OUTPATIENT_CLINIC_OR_DEPARTMENT_OTHER): Payer: Self-pay

## 2023-03-20 DIAGNOSIS — U071 COVID-19: Secondary | ICD-10-CM | POA: Diagnosis not present

## 2023-03-20 MED ORDER — PAXLOVID (300/100) 20 X 150 MG & 10 X 100MG PO TBPK
3.0000 | ORAL_TABLET | Freq: Two times a day (BID) | ORAL | 0 refills | Status: AC
  Filled 2023-03-20: qty 30, 5d supply, fill #0

## 2023-03-20 MED ORDER — AZITHROMYCIN 250 MG PO TABS: 250.0000 mg | ORAL_TABLET | Freq: Every day | ORAL | 0 refills | Status: DC

## 2023-03-20 MED ORDER — AZITHROMYCIN 250 MG PO TABS
250.0000 mg | ORAL_TABLET | Freq: Every day | ORAL | 0 refills | Status: DC
  Filled 2023-03-20: qty 6, 5d supply, fill #0

## 2023-03-20 MED ORDER — CHLORTHALIDONE 25 MG PO TABS: 25.0000 mg | ORAL_TABLET | Freq: Every day | ORAL | Status: DC

## 2023-03-20 NOTE — ED Provider Notes (Addendum)
Patient awaiting admission for COVID.  With hypoxia.  Patient has been clinically fairly short of breath.  Patient desatted during the night.  Will have respiratory reassessed.  Because patient wants to go home.  But nursing's impression is that with any kind of movement she gets very short of breath.   Vanetta Mulders, MD 03/20/23 1035   Patient now feeling much better.  Wants to go home.  Oxygen saturations staying around 95%.  Also ambulated without significant shortness of breath.  Patient is a candidate for Paxlovid confirmed that with the pharmacist since patient received remdesivir.   Cautions provided.  Patient's potassium was a little low but did receive some IV potassium.  Can just have that rechecked in a week.  Patient did have some evidence of pneumonia we will have her continue with the Z-Pak but most likely the lung changes were COVID related.  This patient did not have a leukocytosis.   Vanetta Mulders, MD 03/20/23 1300

## 2023-03-20 NOTE — Discharge Instructions (Addendum)
Your chest x-ray raise some concerns for pneumonia probably related to the COVID.  But would recommend continuing the Zithromax at home prescription provided and sent to the pharmacy here.  Your potassium was slightly down but she received some potassium correction while here you do not need to have that rechecked until about a week.  Isolate yourself for the next 5 days due to the COVID infection.  Return for any new or worse symptoms particularly if there is any shortness of breath.  Also you are a candidate for the Paxlovid.  Take it as directed.

## 2023-03-20 NOTE — ED Notes (Signed)
Pt verbalized understanding of discharge paperwork including follow-up care and prescriptions. Pt given paper scrubs to wear home and requesting to walk to car.

## 2023-03-20 NOTE — ED Notes (Signed)
Patient oxygen saturation on room air while at rest = 96% HR 89 RR 18 Patient oxygen saturation on room air while ambulating+ 92%  HR 109   RR 22   Patient appears to be in no distress at this time on room air

## 2023-03-20 NOTE — ED Notes (Signed)
ED TO INPATIENT HANDOFF REPORT  ED Nurse Name and Phone #: [facility]  S Name/Age/Gender Lissa Hoard 77 y.o. female Room/Bed: DB011/DB011  Code Status   Code Status: Not on file  Home/SNF/Other Home Patient oriented to: self, place, time, and situation Is this baseline? Yes   Triage Complete: Triage complete  Chief Complaint Pneumonia due to COVID-19 virus [U07.1, J12.82]  Triage Note Fatigue, body ache, sob weakness. Cough. congestion  +covid at home   Allergies Allergies  Allergen Reactions   Nsaids     Should avoid-- GI bleed   Hydrocodone Nausea And Vomiting    Level of Care/Admitting Diagnosis ED Disposition     ED Disposition  Admit   Condition  --   Comment  Hospital Area: [facility] [[id number]]  Level of Care: Progressive [102]  Admit to Progressive based on following criteria: RESPIRATORY PROBLEMS hypoxemic/hypercapnic respiratory failure that is responsive to NIPPV (BiPAP) or High Flow Nasal Cannula (6-80 lpm). Frequent assessment/intervention, no > Q2 hrs < Q4 hrs, to maintain oxygenation and pulmonary hygiene.  May admit patient to [facility] or [facility] if equivalent level of care is available:: Yes  Interfacility transfer: Yes  Covid Evaluation: Confirmed COVID Positive  Diagnosis: Pneumonia due to COVID-19 virus [[id number]]  Admitting Physician: Arlean Hopping [[id number]]  Attending Physician: Heide Scales [[id number]]  Certification:: I certify this patient will need inpatient services for at least 2 midnights  Estimated Length of Stay: 2          B Medical/Surgery History Past Medical History:  Diagnosis Date   Anemia    Anxiety and depression    during loss of husband   Arthritis    Diverticulosis    GERD (gastroesophageal reflux disease)    GI bleed    hx of; hgb as low as 5.9   Hemorrhoids    Hiatal hernia    HLD (hyperlipidemia)    Obesity    Osteoarthritis    Pneumonia     Urinary tract bacterial infections    Past Surgical History:  Procedure Laterality Date   ABDOMINAL HYSTERECTOMY     states had a "few cancer cells" but they "got everything". ? ovaries and cervix.    APPENDECTOMY     HIATAL HERNIA REPAIR       A IV Location/Drains/Wounds Patient Lines/Drains/Airways Status     Active Line/Drains/Airways     Name Placement date Placement time Site Days   Peripheral IV 03/19/23 18 G 1" Left Antecubital 03/19/23  1800  Antecubital  1            Intake/Output Last 24 hours No intake or output data in the 24 hours ending 03/20/23 2725  Labs/Imaging Results for orders placed or performed during the hospital encounter of 03/19/23 (from the past 48 hour(s))  Resp panel by RT-PCR (RSV, Flu A&B, Covid) Anterior Nasal Swab     Status: Abnormal   Collection Time: 03/19/23  3:40 PM   Specimen: Anterior Nasal Swab  Result Value Ref Range   SARS Coronavirus 2 by RT PCR POSITIVE (A) NEGATIVE    Comment: (NOTE) SARS-CoV-2 target nucleic acids are DETECTED.  The SARS-CoV-2 RNA is generally detectable in upper respiratory specimens during the acute phase of infection. Positive results are indicative of the presence of the identified virus, but do not rule out bacterial infection or co-infection with other pathogens not detected by the test. Clinical correlation with patient history and other diagnostic information is necessary  to determine patient infection status. The expected result is Negative.  Fact Sheet for Patients: BloggerCourse.com  Fact Sheet for Healthcare Providers: SeriousBroker.it  This test is not yet approved or cleared by the [place] FDA and  has been authorized for detection and/or diagnosis of SARS-CoV-2 by FDA under an Emergency Use Authorization (EUA).  This EUA will remain in effect (meaning this test can be used) for the duration of  the COVID-19 declaration under  Section 564(b)(1) of the A ct, 21 U.S.C. section 360bbb-3(b)(1), unless the authorization is terminated or revoked sooner.     Influenza A by PCR NEGATIVE NEGATIVE   Influenza B by PCR NEGATIVE NEGATIVE    Comment: (NOTE) The Xpert Xpress SARS-CoV-2/FLU/RSV plus assay is intended as an aid in the diagnosis of influenza from Nasopharyngeal swab specimens and should not be used as a sole basis for treatment. Nasal washings and aspirates are unacceptable for Xpert Xpress SARS-CoV-2/FLU/RSV testing.  Fact Sheet for Patients: BloggerCourse.com  Fact Sheet for Healthcare Providers: SeriousBroker.it  This test is not yet approved or cleared by the [place] FDA and has been authorized for detection and/or diagnosis of SARS-CoV-2 by FDA under an Emergency Use Authorization (EUA). This EUA will remain in effect (meaning this test can be used) for the duration of the COVID-19 declaration under Section 564(b)(1) of the Act, 21 U.S.C. section 360bbb-3(b)(1), unless the authorization is terminated or revoked.     Resp Syncytial Virus by PCR NEGATIVE NEGATIVE    Comment: (NOTE) Fact Sheet for Patients: BloggerCourse.com  Fact Sheet for Healthcare Providers: SeriousBroker.it  This test is not yet approved or cleared by the [place] FDA and has been authorized for detection and/or diagnosis of SARS-CoV-2 by FDA under an Emergency Use Authorization (EUA). This EUA will remain in effect (meaning this test can be used) for the duration of the COVID-19 declaration under Section 564(b)(1) of the Act, 21 U.S.C. section 360bbb-3(b)(1), unless the authorization is terminated or revoked.  Performed at [facility], [address], [place], [place] [address]   Basic metabolic panel     Status: Abnormal   Collection Time: 03/19/23  4:55 PM  Result Value Ref Range    Sodium 139 135 - 145 mmol/L   Potassium 3.3 (L) 3.5 - 5.1 mmol/L   Chloride 95 (L) 98 - 111 mmol/L   CO2 34 (H) 22 - 32 mmol/L   Glucose, Bld 102 (H) 70 - 99 mg/dL    Comment: Glucose reference range applies only to samples taken after fasting for at least 8 hours.   BUN 11 8 - 23 mg/dL   Creatinine, Ser 1.91 0.44 - 1.00 mg/dL   Calcium 9.3 8.9 - 47.8 mg/dL   GFR, Estimated >29 >56 mL/min    Comment: (NOTE) Calculated using the CKD-EPI Creatinine Equation (2021)    Anion gap 10 5 - 15    Comment: Performed at [facility], [address] [place], [place] [address]  CBC with Differential     Status: Abnormal   Collection Time: 03/19/23  4:55 PM  Result Value Ref Range   WBC 7.3 4.0 - 10.5 K/uL   RBC 4.97 3.87 - 5.11 MIL/uL   Hemoglobin 15.4 (H) 12.0 - 15.0 g/dL   HCT 65.7 84.6 - 96.2 %   MCV 91.1 80.0 - 100.0 fL   MCH 31.0 26.0 - 34.0 pg   MCHC 34.0 30.0 - 36.0 g/dL   RDW 95.2 84.1 - 32.4 %  Platelets 178 150 - 400 K/uL   nRBC 0.0 0.0 - 0.2 %   Neutrophils Relative % 76 %   Neutro Abs 5.5 1.7 - 7.7 K/uL   Lymphocytes Relative 18 %   Lymphs Abs 1.3 0.7 - 4.0 K/uL   Monocytes Relative 6 %   Monocytes Absolute 0.5 0.1 - 1.0 K/uL   Eosinophils Relative 0 %   Eosinophils Absolute 0.0 0.0 - 0.5 K/uL   Basophils Relative 0 %   Basophils Absolute 0.0 0.0 - 0.1 K/uL   Immature Granulocytes 0 %   Abs Immature Granulocytes 0.02 0.00 - 0.07 K/uL    Comment: Performed at [facility], [address], [place], [place] [address]  Brain natriuretic peptide     Status: None   Collection Time: 03/19/23  4:55 PM  Result Value Ref Range   B Natriuretic Peptide 45.4 0.0 - 100.0 pg/mL    Comment: Performed at [facility], [address] [place], [place] [address]  Troponin I (High Sensitivity)     Status: None   Collection Time: 03/19/23  4:55 PM  Result Value Ref Range   Troponin I (High Sensitivity) 4 <18 ng/L    Comment:  (NOTE) Elevated high sensitivity troponin I (hsTnI) values and significant  changes across serial measurements may suggest ACS but many other  chronic and acute conditions are known to elevate hsTnI results.  Refer to the "Links" section for chest pain algorithms and additional  guidance. Performed at [facility], [address] [place], [place] [address]   Lactic acid, plasma     Status: None   Collection Time: 03/19/23  4:55 PM  Result Value Ref Range   Lactic Acid, Venous 0.9 0.5 - 1.9 mmol/L    Comment: Performed at [facility], [address] [place], [place] [address]   CT Angio Chest PE W and/or Wo Contrast  Result Date: 03/19/2023 CLINICAL DATA:  Pulmonary embolism suspected, high probability. Fatigue, body aches, shortness of breath, weakness, cough, and congestion. COVID positive. EXAM: CT ANGIOGRAPHY CHEST WITH CONTRAST TECHNIQUE: Multidetector CT imaging of the chest was performed using the standard protocol during bolus administration of intravenous contrast. Multiplanar CT image reconstructions and MIPs were obtained to evaluate the vascular anatomy. RADIATION DOSE REDUCTION: This exam was performed according to the departmental dose-optimization program which includes automated exposure control, adjustment of the mA and/or kV according to patient size and/or use of iterative reconstruction technique. CONTRAST:  75mL OMNIPAQUE IOHEXOL 350 MG/ML SOLN COMPARISON:  None Available. FINDINGS: Cardiovascular: The heart is normal in size and there is a trace pericardial effusion. Few scattered coronary artery calcifications are noted. There is atherosclerotic calcification of the aorta without evidence of aneurysm. The pulmonary trunk is normal in caliber. No evidence of pulmonary embolism. Mediastinum/Nodes: No mediastinal, hilar, or axillary lymphadenopathy. There is enlargement of the left lobe of the thyroid gland. The trachea and esophagus are  within normal limits. There is a large hiatal hernia containing the stomach. Lungs/Pleura: Atelectasis is noted in the left lower lobe adjacent to the hernia. No effusion or pneumothorax. Few tree-in-bud nodular opacities are noted in the left lower lobe, axial image 74. Upper Abdomen: Fatty infiltration of the liver is noted. No acute abnormality. Musculoskeletal: Degenerative changes are present in the thoracic spine. No acute osseous abnormality. Review of the MIP images confirms the above findings. IMPRESSION: 1. No evidence of pulmonary embolism. 2. A few scattered tree-in-bud nodular opacities in the right lower lobe,  likely infectious or inflammatory. 3. Large hiatal hernia containing the stomach with atelectasis in the left lower lobe. 4. Hepatic steatosis. 5. Aortic atherosclerosis and coronary artery calcifications. Electronically Signed   By: Thornell Sartorius M.D.   On: 03/19/2023 23:25   DG Chest Port 1 View  Result Date: 03/19/2023 CLINICAL DATA:  141880 SOB (shortness of breath) 141880 EXAM: PORTABLE CHEST 1 VIEW COMPARISON:  12/09/2022. FINDINGS: There are atelectatic changes at the left lung base. Bilateral lungs are otherwise clear. Redemonstration of retrocardiac moderate-to-large hiatal hernia. Bilateral costophrenic angles are clear. Stable cardio-mediastinal silhouette. No acute osseous abnormalities. The soft tissues are within normal limits. IMPRESSION: 1. Left basilar atelectasis. 2. Moderate-to-large hiatal hernia. Electronically Signed   By: Jules Schick M.D.   On: 03/19/2023 16:38    Pending Labs Unresulted Labs (From admission, onward)     Start     Ordered   03/19/23 1655  Culture, blood (routine x 2)  BLOOD CULTURE X 2,   R (with STAT occurrences)      03/19/23 1654            Vitals/Pain Today's Vitals   03/20/23 0515 03/20/23 0530 03/20/23 0545 03/20/23 0600  BP:    120/76  Pulse: 66 79 73 71  Resp: (!) 24 (!) 25 (!) 21 (!) 27  Temp:      TempSrc:      SpO2:  97% 97% 98% 98%  PainSc:        Isolation Precautions Airborne Contact for COVID+  Medications Medications  remdesivir 100 mg in sodium chloride 0.9 % 100 mL IVPB (has no administration in time range)  acetaminophen (TYLENOL) tablet 1,000 mg (1,000 mg Oral Given 03/19/23 1814)  potassium chloride SA (KLOR-CON M) CR tablet 40 mEq (40 mEq Oral Given 03/19/23 2029)  iohexol (OMNIPAQUE) 350 MG/ML injection 100 mL (75 mLs Intravenous Contrast Given 03/19/23 2300)  cefTRIAXone (ROCEPHIN) 1 g in sodium chloride 0.9 % 100 mL IVPB (0 g Intravenous Stopped 03/20/23 0032)  azithromycin (ZITHROMAX) 500 mg in sodium chloride 0.9 % 250 mL IVPB (0 mg Intravenous Stopped 03/20/23 0138)  dexamethasone (DECADRON) injection 6 mg (6 mg Intravenous Given 03/19/23 2357)  remdesivir 100 mg in sodium chloride 0.9 % 100 mL IVPB (0 mg Intravenous Stopped 03/20/23 0313)    Mobility Walks -  Pt was able to ambulate to bedside commode without assistance. There is increased work of breathing with ambulation, requires oxygen.      Focused Assessments Cardiac Assessment Handoff:  Cardiac Rhythm: Sinus tachycardia Lab Results  Component Value Date   CKTOTAL 28 03/10/2008   CKMB 0.9 03/10/2008   TROPONINI 0.01        NO INDICATION OF MYOCARDIAL INJURY. 03/10/2008   Lab Results  Component Value Date   DDIMER 0.27 03/12/2016   Does the Patient currently have chest pain? No   , Pulmonary Assessment Handoff:  Lung sounds: Bilateral Breath Sounds: Clear, Diminished O2 Device: Nasal Cannula O2 Flow Rate (L/min): 2 L/min - Not typically on home oxygen    R Recommendations: See Admitting Provider Note  Report given to:   Additional Notes: -

## 2023-03-21 ENCOUNTER — Other Ambulatory Visit (HOSPITAL_BASED_OUTPATIENT_CLINIC_OR_DEPARTMENT_OTHER): Payer: Self-pay

## 2023-03-27 ENCOUNTER — Telehealth: Payer: Self-pay

## 2023-03-27 NOTE — Telephone Encounter (Signed)
Transition Care Management Unsuccessful Follow-up Telephone Call  Date of discharge and from where:  [facility]  Attempts:  1st Attempt  Reason for unsuccessful TCM follow-up call:  No answer/busy   Lenard Forth [facility] Guide, [facility] [phone] [address] [place], [place], [place] [address] Phone: [phone] Email: [email or web address]

## 2023-03-27 NOTE — Telephone Encounter (Signed)
Transition Care Management Unsuccessful Follow-up Telephone Call  Date of discharge and from where:  [facility]  Attempts:  2nd Attempt  Reason for unsuccessful TCM follow-up call:  No answer/busy   Lenard Forth [facility] Guide, [facility] [phone] [address] [place], [place], [place] [address] Phone: [phone] Email: [email or web address]

## 2023-03-30 ENCOUNTER — Encounter: Payer: Self-pay | Admitting: Family Medicine

## 2023-03-30 ENCOUNTER — Telehealth: Payer: Self-pay | Admitting: Family Medicine

## 2023-03-30 ENCOUNTER — Other Ambulatory Visit: Payer: Self-pay | Admitting: Family Medicine

## 2023-03-30 DIAGNOSIS — Z1231 Encounter for screening mammogram for malignant neoplasm of breast: Secondary | ICD-10-CM

## 2023-03-30 NOTE — Telephone Encounter (Signed)
Pt had Covid 2 weeks ago and is still having issues with her voice and no energy. She did not think it was a good idea to come in office and would like a call back. Please advise.

## 2023-03-30 NOTE — Telephone Encounter (Signed)
Called and spoke with pt and advised to schedule ov to be evaluated, pt transferred to scheduling.

## 2023-03-31 ENCOUNTER — Ambulatory Visit (INDEPENDENT_AMBULATORY_CARE_PROVIDER_SITE_OTHER)
Admission: RE | Admit: 2023-03-31 | Discharge: 2023-03-31 | Disposition: A | Discharge status: Home / Self Care | Payer: Medicare Other | Source: Ambulatory Visit | Attending: Physician Assistant | Admitting: Physician Assistant

## 2023-03-31 ENCOUNTER — Ambulatory Visit (INDEPENDENT_AMBULATORY_CARE_PROVIDER_SITE_OTHER): Payer: Medicare Other | Admitting: Physician Assistant

## 2023-03-31 VITALS — BP 128/80 | HR 87 | Temp 98.3°F | Ht 62.0 in | Wt 217.8 lb

## 2023-03-31 DIAGNOSIS — U099 Post covid-19 condition, unspecified: Secondary | ICD-10-CM | POA: Diagnosis not present

## 2023-03-31 DIAGNOSIS — R0602 Shortness of breath: Secondary | ICD-10-CM | POA: Diagnosis not present

## 2023-03-31 DIAGNOSIS — J189 Pneumonia, unspecified organism: Secondary | ICD-10-CM | POA: Diagnosis not present

## 2023-03-31 DIAGNOSIS — E876 Hypokalemia: Secondary | ICD-10-CM | POA: Diagnosis not present

## 2023-03-31 DIAGNOSIS — U071 COVID-19: Secondary | ICD-10-CM | POA: Diagnosis not present

## 2023-03-31 DIAGNOSIS — J9811 Atelectasis: Secondary | ICD-10-CM | POA: Diagnosis not present

## 2023-03-31 DIAGNOSIS — K449 Diaphragmatic hernia without obstruction or gangrene: Secondary | ICD-10-CM | POA: Diagnosis not present

## 2023-03-31 NOTE — Patient Instructions (Signed)
Please go today for repeat XRAY - I suspect we will need additional course of antibiotics. I will call you with results and plan after XRAY back and talking with your PCP.  [facility] [address] [place], [place] [address] Tel: [phone] Hours: M-F 8:30 am - 5:00 pm Closed for lunch 12:30 pm - 1:00 pm  REST!! Hydrate well. Monitor your oxygen saturation at home if you can get a pulse ox for your finger - if trending below 90% at all, let us know!   If acutely worse short of breath, dizziness, chest pain, etc, go to the ER.  COVID recovery can take time, be patient with yourself.

## 2023-03-31 NOTE — Progress Notes (Signed)
Subjective:    Patient ID: Shelly Johnson, female    DOB: May 23, 1946, 78 y.o.   MRN: [id number]  Chief Complaint  Patient presents with   Sore Throat   Cough    Started a week and half ago. Tested positive 03/19/23 during ED visit. Constipated,Fatigue, cough, sore throat, thought she was getting better Saturday but the following day she was worse. Does have SOB when she over exert    Sinus Problem    HPI Patient is in today for post-COVID-19 symptoms. ED visit 03/19/23 - positive for COVID-19.  Voice hoarseness, starting to improve somewhat.  Still staying very congested. Persistent cough, productive sputum only once yesterday, does seem to be improving. Sometimes coughing til gagging.  Sneezing frequently. Completed course of Z-pak & Paxlovid from hospital visit.   03/28/23 finally felt like she was better. Then next day felt very tired again, maybe did too much on Saturday. Very exhausted, can sit down and go right to sleep.   SOB is better, but still not good per pt. Walking to mailbox still takes a lot out of her. Lower leg edema is better per patient. No chest pain.    Past Medical History:  Diagnosis Date   Anemia    Anxiety and depression    during loss of husband   Arthritis    Diverticulosis    GERD (gastroesophageal reflux disease)    GI bleed    hx of; hgb as low as 5.9   Hemorrhoids    Hiatal hernia    HLD (hyperlipidemia)    Obesity    Osteoarthritis    Pneumonia    Urinary tract bacterial infections     Past Surgical History:  Procedure Laterality Date   ABDOMINAL HYSTERECTOMY     states had a "few cancer cells" but they "got everything". ? ovaries and cervix.    APPENDECTOMY     HIATAL HERNIA REPAIR      Family History  Problem Relation Age of Onset   Arthritis Mother    Uterine cancer Mother        uterine   Diabetes Brother        unknown cause   Hypertension Brother    Diabetes Brother        complications after amputation   Breast  cancer Maternal Grandmother    Colon cancer Maternal Aunt 11   CAD Brother        medical error as reported cause of death   Lung cancer Brother        youngest   Colon polyps Neg Hx    Esophageal cancer Neg Hx    Stomach cancer Neg Hx     Social History   Tobacco Use   Smoking status: Never   Smokeless tobacco: Never  Vaping Use   Vaping status: Never Used  Substance Use Topics   Alcohol use: Yes    Comment: 1 per every other  month/ wine   Drug use: No     Allergies  Allergen Reactions   Nsaids     Should avoid-- GI bleed   Hydrocodone Nausea And Vomiting    Review of Systems NEGATIVE UNLESS OTHERWISE INDICATED IN HPI      Objective:     BP 128/80 (BP Location: Left Arm, Patient Position: Sitting, Cuff Size: Normal)   Pulse 87   Temp 98.3 F (36.8 C) (Temporal)   Ht 5\' 2"  (1.575 m)   Wt 217 lb 12.8 oz (98.8  kg)   SpO2 95%   BMI 39.84 kg/m   Wt Readings from Last 3 Encounters:  03/31/23 217 lb 12.8 oz (98.8 kg)  03/20/23 213 lb 13.5 oz (97 kg)  12/09/22 214 lb 9.6 oz (97.3 kg)    BP Readings from Last 3 Encounters:  03/31/23 128/80  03/20/23 104/81  12/09/22 124/78     Physical Exam Vitals and nursing note reviewed.  Constitutional:      General: She is not in acute distress.    Appearance: Normal appearance. She is not ill-appearing.  HENT:     Head: Normocephalic.     Right Ear: Tympanic membrane, ear canal and external ear normal.     Left Ear: Tympanic membrane, ear canal and external ear normal.     Nose: Congestion present.     Mouth/Throat:     Mouth: Mucous membranes are moist.     Pharynx: No oropharyngeal exudate or posterior oropharyngeal erythema.  Eyes:     Extraocular Movements: Extraocular movements intact.     Conjunctiva/sclera: Conjunctivae normal.     Pupils: Pupils are equal, round, and reactive to light.  Cardiovascular:     Rate and Rhythm: Normal rate and regular rhythm.     Pulses: Normal pulses.     Heart  sounds: Normal heart sounds. No murmur heard. Pulmonary:     Effort: Pulmonary effort is normal. No respiratory distress.     Breath sounds: No wheezing.     Comments: Diminished LLL Musculoskeletal:     Cervical back: Normal range of motion.     Right lower leg: No edema.     Left lower leg: No edema.  Skin:    General: Skin is warm.  Neurological:     Mental Status: She is alert and oriented to person, place, and time.  Psychiatric:        Mood and Affect: Mood normal.        Behavior: Behavior normal.        Assessment & Plan:  Pneumonia of left lower lobe due to infectious organism -     DG Chest 2 View; Future  Post-COVID syndrome -     DG Chest 2 View; Future  Hypokalemia    77 year old female with recent COVID-19 diagnosis and hospital visit on 03/19/2023, presenting for follow-up today.  I had my CMA walk with her with a pulse oximeter and thankfully her SpO2 did not drop below 90%.  She averaged between 93 to 95% on room air.  She was getting tired with walking and felt a little bit winded, but states this is no different than what she has been experiencing.  She is coughing quite a bit in the exam room and very congested.  I reviewed her ER note.  Thankfully her CTA chest was negative for pulmonary embolus.  There was concern about possible left lower lobe infiltrate.  She completed course of Z-Pak.  Breath sounds still sound diminished on this side, plan to repeat chest x-ray today for better characterization.  Discussed with patient I do anticipate additional course of antibiotics, consider prednisone and an inhaler.  She does not meet qualifications for needing oxygen.  Expressed to the patient that COVID-19 symptoms can linger and she needs to rest, stay hydrated, and be patient with herself.  ER precautions, red flags strongly discussed with the patient.  She is agreeable and understanding of plan today.   Of note, her potassium was slightly low in the emergency  department.  She states that she was given tablets to take for repletion.  I advised rechecking blood work today, to which patient politely but adamantly declined, stating that she is a really hard stick and she is still bruised from the ER.   Return if symptoms worsen or fail to improve.  This note was prepared with assistance of Conservation officer, historic buildings. Occasional wrong-word or sound-a-like substitutions may have occurred due to the inherent limitations of voice recognition software.    Luverna Degenhart M Yitzchak Kothari, PA-C

## 2023-04-01 ENCOUNTER — Other Ambulatory Visit: Payer: Self-pay | Admitting: Physician Assistant

## 2023-04-01 MED ORDER — DOXYCYCLINE HYCLATE 100 MG PO TABS: 100.0000 mg | ORAL_TABLET | Freq: Two times a day (BID) | ORAL | 0 refills | Status: AC

## 2023-04-01 MED ORDER — ALBUTEROL SULFATE HFA 108 (90 BASE) MCG/ACT IN AERS
2.0000 | INHALATION_SPRAY | Freq: Four times a day (QID) | RESPIRATORY_TRACT | 2 refills | Status: AC | PRN
Start: 2023-04-01 — End: ?

## 2023-04-01 MED ORDER — PREDNISONE 20 MG PO TABS: 20.0000 mg | ORAL_TABLET | Freq: Two times a day (BID) | ORAL | 0 refills | Status: AC

## 2023-04-02 ENCOUNTER — Encounter (INDEPENDENT_AMBULATORY_CARE_PROVIDER_SITE_OTHER): Payer: Self-pay

## 2023-04-15 ENCOUNTER — Telehealth: Payer: Self-pay | Admitting: Primary Care

## 2023-04-15 ENCOUNTER — Ambulatory Visit (INDEPENDENT_AMBULATORY_CARE_PROVIDER_SITE_OTHER): Payer: Medicare Other | Admitting: Primary Care

## 2023-04-15 ENCOUNTER — Encounter: Payer: Self-pay | Admitting: Primary Care

## 2023-04-15 VITALS — BP 124/70 | HR 98 | Temp 98.4°F | Ht 63.0 in | Wt 217.6 lb

## 2023-04-15 DIAGNOSIS — G4733 Obstructive sleep apnea (adult) (pediatric): Secondary | ICD-10-CM | POA: Diagnosis not present

## 2023-04-15 DIAGNOSIS — G4730 Sleep apnea, unspecified: Secondary | ICD-10-CM | POA: Diagnosis not present

## 2023-04-15 NOTE — Assessment & Plan Note (Addendum)
-   Patient has symptoms of snoring and daytime fatigue. HST 03/07/23 showed severe OSA, AHI 34.6/hour with SpO2 low 71% (average 87%).  We reviewed sleep study results today, risks of untreated sleep apnea and treatment options. She initally wanted to try oral appliance. Recommending patient be started on CPAP due to severity of her OSA and hypoxemia. After showing her several different CPAP masks and machine size she is open to trying. We will place DME order for auto CPAP 5-15cm h20 with mask of choice.  Flashy into her CPAP nightly for 4 to 6 hours or longer.  Encouraged weight loss efforts.  Follow-up 6 to 8 weeks for CPAP compliance check. Would recommend getting ONO at follow-up on CPAP.

## 2023-04-15 NOTE — Progress Notes (Signed)
@Patient  ID: Shelly Johnson, female    DOB: [birth year], 77 y.o.   MRN: [id number]  Chief Complaint  Patient presents with   Follow-up    Review HST    Referring provider: Shelva Majestic, MD  HPI: 77 year old female, never smoked.  Past medical history significant for hypertension, hiatal hernia, hyperlipidemia, hyperglycemia, vitamin D deficiency,  anemia, morbid obesity.  Previous [facility] pulmonary encounter: 12/09/2022 Patient presents today for sleep consult. She has symptoms of daytime sleepiness/fatigue. Needs to rest her eyes or take a nap some days. She does think that she snores, she can hear herself when she doses off at times. She goes to bed 11pm-12:30am. She does not always go to bed at the same time. It does not take her long to fall asleep. She gets on average 5-8 hours of sleep a night. She wakes up 0-3 times a night to use the restroom. No concern for narcolepsy, cataplexy or sleep walking.   She experiences shortness of breath walking up stairs or walking to the mailbox. She has been under a lot of stress recently. Weight can fluctuate 5 lbs. She was walking every other day up to a mile until recently due to neuropathy. Denies chest tightness or wheezing. No hx childhood asthma. Never smoked. She had CXR in August 2022 showed large hiatal hernia. She has seen GI in the past for dysphagia, taking reflux medication daily and symptoms improved. Echocardiogram in 2018 showed grade 1 diastolic dysfunction. No leg swelling.   Sleep questionnaire Symptoms- snoring, daytime fatigue  Prior sleep study- none  Bedtime- 11pm-12:30am  Time to fall asleep- not long  Nocturnal awakenings- 0-3 times Out of bed/start of day- 6-6:30am to 7-7:30am  Weight changes- not much  Do you operate heavy machinery- no Do you currently wear CPAP- no Do you current wear oxygen- no Epworth- 3    04/15/2023- Interim hx  Patient presents today to review sleep study results.  Seen for sleep consult  in April due to snoring symptoms and daytime fatigue. Epworth score 3. Home sleep study on 03/07/2023 showed severe obstructive sleep apnea, AHI 34.6/h with SpO2 low 71% (average 87%).  We reviewed sleep study results today.  Recommending patient be started on CPAP due to severity of her OSA and hypoxemia.  Patient somewhat reluctant and initially wanted to try oral appliance, however, after encouraging she consider CPAP she is open to trying.  Her husband had CPAP and did not tolerate well, she tells me that the machine was large and bulky.  She was more agreeing to CPAP trial after showing her several different mask types and size of CPAP machine.     Allergies  Allergen Reactions   Nsaids     Should avoid-- GI bleed   Hydrocodone Nausea And Vomiting    Immunization History  Administered Date(s) Administered   Fluad Quad(high Dose 65+) 06/04/2020, 05/30/2021, 06/25/2022   Influenza, High Dose Seasonal PF 07/04/2016, 06/19/2017, 04/28/2018   Influenza,inj,Quad PF,6+ Mos 07/18/2013, 04/25/2014   Janssen (J&J) SARS-COV-2 Vaccination 02/09/2020   Pneumococcal Conjugate-13 07/18/2013, 04/03/2015   Pneumococcal Polysaccharide-23 07/04/2016   Tdap 07/21/2013    Past Medical History:  Diagnosis Date   Anemia    Anxiety and depression    during loss of husband   Arthritis    Diverticulosis    GERD (gastroesophageal reflux disease)    GI bleed    hx of; hgb as low as 5.9   Hemorrhoids    Hiatal hernia  HLD (hyperlipidemia)    Obesity    Osteoarthritis    Pneumonia    Urinary tract bacterial infections     Tobacco History: Social History   Tobacco Use  Smoking Status Never  Smokeless Tobacco Never   Counseling given: Not Answered   Outpatient Medications Prior to Visit  Medication Sig Dispense Refill   albuterol (VENTOLIN HFA) 108 (90 Base) MCG/ACT inhaler Inhale 2 puffs into the lungs every 6 (six) hours as needed for wheezing or shortness of breath. Use 30 minutes prior  to activity. 8 g 2   chlorthalidone (HYGROTON) 25 MG tablet TAKE 1 TABLET BY MOUTH DAILY 90 tablet 2   ferrous sulfate 325 (65 FE) MG tablet Take 1 tablet (325 mg total) by mouth 2 (two) times daily. (Patient taking differently: Take 325 mg by mouth daily.) 60 tablet 3   naproxen sodium (ALEVE) 220 MG tablet Take 220 mg by mouth. As needed     omeprazole (PRILOSEC) 40 MG capsule TAKE ONE CAPSULE BY MOUTH DAILY 90 capsule 2   vitamin C (ASCORBIC ACID) 500 MG tablet Take 500 mg by mouth daily.       VITAMIN D PO Take 1 tablet by mouth daily.     azithromycin (ZITHROMAX) 250 MG tablet Take 1 tablet (250 mg total) by mouth daily. Take first 2 tablets together, then 1 every day until finished. (Patient not taking: Reported on 04/15/2023) 6 tablet 0   No facility-administered medications prior to visit.   Review of Systems  Review of Systems  Constitutional: Negative.   HENT: Negative.    Respiratory: Negative.    Cardiovascular: Negative.    Physical Exam  BP 124/70 (BP Location: Left Arm, Patient Position: Sitting, Cuff Size: Large)   Pulse 98   Temp 98.4 F (36.9 C) (Oral)   Ht 5\' 3"  (1.6 m)   Wt 217 lb 9.6 oz (98.7 kg)   SpO2 96%   BMI 38.55 kg/m  Physical Exam Constitutional:      Appearance: Normal appearance.  HENT:     Head: Normocephalic and atraumatic.  Cardiovascular:     Rate and Rhythm: Normal rate and regular rhythm.  Pulmonary:     Effort: Pulmonary effort is normal.     Breath sounds: Normal breath sounds.  Musculoskeletal:        General: Normal range of motion.  Skin:    General: Skin is warm and dry.  Neurological:     General: No focal deficit present.     Mental Status: She is alert and oriented to person, place, and time. Mental status is at baseline.  Psychiatric:        Mood and Affect: Mood normal.        Behavior: Behavior normal.        Thought Content: Thought content normal.        Judgment: Judgment normal.      Lab Results:  CBC     Component Value Date/Time   WBC 7.3 03/19/2023 1655   RBC 4.97 03/19/2023 1655   HGB 15.4 (H) 03/19/2023 1655   HCT 45.3 03/19/2023 1655   PLT 178 03/19/2023 1655   MCV 91.1 03/19/2023 1655   MCH 31.0 03/19/2023 1655   MCHC 34.0 03/19/2023 1655   RDW 13.1 03/19/2023 1655   LYMPHSABS 1.3 03/19/2023 1655   MONOABS 0.5 03/19/2023 1655   EOSABS 0.0 03/19/2023 1655   BASOSABS 0.0 03/19/2023 1655    BMET    Component Value Date/Time  NA 139 03/19/2023 1655   K 3.3 (L) 03/19/2023 1655   CL 95 (L) 03/19/2023 1655   CO2 34 (H) 03/19/2023 1655   GLUCOSE 102 (H) 03/19/2023 1655   BUN 11 03/19/2023 1655   CREATININE 0.78 03/19/2023 1655   CREATININE 0.86 06/04/2020 1538   CALCIUM 9.3 03/19/2023 1655   GFRNONAA >60 03/19/2023 1655   GFRNONAA 67 06/04/2020 1538   GFRAA 78 06/04/2020 1538    BNP    Component Value Date/Time   BNP 45.4 03/19/2023 1655    ProBNP No results found for: "PROBNP"  Imaging: DG Chest 2 View  Result Date: 03/31/2023 CLINICAL DATA:  COVID-19. Shortness of breath. Left lower lobe changes on previous x-ray. EXAM: CHEST - 2 VIEW COMPARISON:  03/19/2023 FINDINGS: Shallow inspiration. Heart size and pulmonary vascularity are normal for technique. There is a large esophageal hiatal hernia behind the heart with an air-fluid level present. No change since previous study. Atelectasis in the left lung base is also unchanged. No pleural effusions. No pneumothorax. No developing consolidation. Calcification of the aorta. Degenerative changes in the spine. IMPRESSION: 1. Large esophageal hiatal hernia behind the heart. 2. Atelectasis in the left base. 3. No progression since prior study. Electronically Signed   By: Burman Nieves M.D.   On: 03/31/2023 16:07   CT Angio Chest PE W and/or Wo Contrast  Result Date: 03/19/2023 CLINICAL DATA:  Pulmonary embolism suspected, high probability. Fatigue, body aches, shortness of breath, weakness, cough, and congestion. COVID  positive. EXAM: CT ANGIOGRAPHY CHEST WITH CONTRAST TECHNIQUE: Multidetector CT imaging of the chest was performed using the standard protocol during bolus administration of intravenous contrast. Multiplanar CT image reconstructions and MIPs were obtained to evaluate the vascular anatomy. RADIATION DOSE REDUCTION: This exam was performed according to the departmental dose-optimization program which includes automated exposure control, adjustment of the mA and/or kV according to patient size and/or use of iterative reconstruction technique. CONTRAST:  75mL OMNIPAQUE IOHEXOL 350 MG/ML SOLN COMPARISON:  None Available. FINDINGS: Cardiovascular: The heart is normal in size and there is a trace pericardial effusion. Few scattered coronary artery calcifications are noted. There is atherosclerotic calcification of the aorta without evidence of aneurysm. The pulmonary trunk is normal in caliber. No evidence of pulmonary embolism. Mediastinum/Nodes: No mediastinal, hilar, or axillary lymphadenopathy. There is enlargement of the left lobe of the thyroid gland. The trachea and esophagus are within normal limits. There is a large hiatal hernia containing the stomach. Lungs/Pleura: Atelectasis is noted in the left lower lobe adjacent to the hernia. No effusion or pneumothorax. Few tree-in-bud nodular opacities are noted in the left lower lobe, axial image 74. Upper Abdomen: Fatty infiltration of the liver is noted. No acute abnormality. Musculoskeletal: Degenerative changes are present in the thoracic spine. No acute osseous abnormality. Review of the MIP images confirms the above findings. IMPRESSION: 1. No evidence of pulmonary embolism. 2. A few scattered tree-in-bud nodular opacities in the right lower lobe, likely infectious or inflammatory. 3. Large hiatal hernia containing the stomach with atelectasis in the left lower lobe. 4. Hepatic steatosis. 5. Aortic atherosclerosis and coronary artery calcifications. Electronically  Signed   By: Thornell Sartorius M.D.   On: 03/19/2023 23:25   DG Chest Port 1 View  Result Date: 03/19/2023 CLINICAL DATA:  141880 SOB (shortness of breath) [id number] EXAM: PORTABLE CHEST 1 VIEW COMPARISON:  12/09/2022. FINDINGS: There are atelectatic changes at the left lung base. Bilateral lungs are otherwise clear. Redemonstration of retrocardiac moderate-to-large hiatal hernia. Bilateral  costophrenic angles are clear. Stable cardio-mediastinal silhouette. No acute osseous abnormalities. The soft tissues are within normal limits. IMPRESSION: 1. Left basilar atelectasis. 2. Moderate-to-large hiatal hernia. Electronically Signed   By: Jules Schick M.D.   On: 03/19/2023 16:38     Assessment & Plan:   Severe sleep apnea - Patient has symptoms of snoring and daytime fatigue. HST 03/07/23 showed severe OSA, AHI 34.6/hour with SpO2 low 71% (average 87%).  We reviewed sleep study results today, risks of untreated sleep apnea and treatment options. She initally wanted to try oral appliance. Recommending patient be started on CPAP due to severity of her OSA and hypoxemia. After showing her several different CPAP masks and machine size she is open to trying. We will place DME order for auto CPAP 5-15cm h20 with mask of choice.  Flashy into her CPAP nightly for 4 to 6 hours or longer.  Encouraged weight loss efforts.  Follow-up 6 to 8 weeks for CPAP compliance check. Would recommend getting ONO at follow-up on CPAP.    Glenford Bayley, NP 04/15/2023

## 2023-04-15 NOTE — Patient Instructions (Addendum)
Stay very hydrated Follow BRAT diet for 3 days (banana, rice, apple sauce, toast-  very bland food)  If needed you can take imodium for diarrhea   Aim to wear CPAP nightly 4-6 hours or more  Continue to work on weight loss, speak with primary care about your options Do not drive if tired  If you have a hard time tolerating mask you initally choose, contact medical supply store where you get your CPAP and try different mask   Orders: Auto CPAP 5-15cm h20 with mask of choice   Follow-up: 8 weeks with Beth NP for CPAP check    CPAP and BIPAP Information CPAP and BIPAP are methods that use air pressure to keep your airways open and to help you breathe well. CPAP and BIPAP use different amounts of pressure. Your health care provider will tell you whether CPAP or BIPAP would be more helpful for you. CPAP stands for "continuous positive airway pressure." With CPAP, the amount of pressure stays the same while you breathe in (inhale) and out (exhale). BIPAP stands for "bi-level positive airway pressure." With BIPAP, the amount of pressure will be higher when you inhale and lower when you exhale. This allows you to take larger breaths. CPAP or BIPAP may be used in the hospital, or your health care provider may want you to use it at home. You may need to have a sleep study before your health care provider can order a machine for you to use at home. What are the advantages? CPAP or BIPAP can be helpful if you have: Sleep apnea. Chronic obstructive pulmonary disease (COPD). Heart failure. Medical conditions that cause muscle weakness, including muscular dystrophy or amyotrophic lateral sclerosis (ALS). Other problems that cause breathing to be shallow, weak, abnormal, or difficult. CPAP and BIPAP are most commonly used for obstructive sleep apnea (OSA) to keep the airways from collapsing when the muscles relax during sleep. What are the risks? Generally, this is a safe treatment. However, problems  may occur, including: Irritated skin or skin sores if the mask does not fit properly. Dry or stuffy nose or nosebleeds. Dry mouth. Feeling gassy or bloated. Sinus or lung infection if the equipment is not cleaned properly. When should CPAP or BIPAP be used? In most cases, the mask only needs to be worn during sleep. Generally, the mask needs to be worn throughout the night and during any daytime naps. People with certain medical conditions may also need to wear the mask at other times, such as when they are awake. Follow instructions from your health care provider about when to use the machine. What happens during CPAP or BIPAP?  Both CPAP and BIPAP are provided by a small machine with a flexible plastic tube that attaches to a plastic mask that you wear. Air is blown through the mask into your nose or mouth. The amount of pressure that is used to blow the air can be adjusted on the machine. Your health care provider will set the pressure setting and help you find the best mask for you. Tips for using the mask Because the mask needs to be snug, some people feel trapped or closed-in (claustrophobic) when first using the mask. If you feel this way, you may need to get used to the mask. One way to do this is to hold the mask loosely over your nose or mouth and then gradually apply the mask more snugly. You can also gradually increase the amount of time that you use the mask.  Masks are available in various types and sizes. If your mask does not fit well, talk with your health care provider about getting a different one. Some common types of masks include: Full face masks, which fit over the mouth and nose. Nasal masks, which fit over the nose. Nasal pillow or prong masks, which fit into the nostrils. If you are using a mask that fits over your nose and you tend to breathe through your mouth, a chin strap may be applied to help keep your mouth closed. Use a skin barrier to protect your skin as told by  your health care provider. Some CPAP and BIPAP machines have alarms that may sound if the mask comes off or develops a leak. If you have trouble with the mask, it is very important that you talk with your health care provider about finding a way to make the mask easier to tolerate. Do not stop using the mask. There could be a negative impact on your health if you stop using the mask. Tips for using the machine Place your CPAP or BIPAP machine on a secure table or stand near an electrical outlet. Know where the on/off switch is on the machine. Follow instructions from your health care provider about how to set the pressure on your machine and when you should use it. Do not eat or drink while the CPAP or BIPAP machine is on. Food or fluids could get pushed into your lungs by the pressure of the CPAP or BIPAP. For home use, CPAP and BIPAP machines can be rented or purchased through home health care companies. Many different brands of machines are available. Renting a machine before purchasing may help you find out which particular machine works well for you. Your health insurance company may also decide which machine you may get. Keep the CPAP or BIPAP machine and attachments clean. Ask your health care provider for specific instructions. Check the humidifier if you have a dry stuffy nose or nosebleeds. Make sure it is working correctly. Follow these instructions at home: Take over-the-counter and prescription medicines only as told by your health care provider. Ask if you can take sinus medicine if your sinuses are blocked. Do not use any products that contain nicotine or tobacco. These products include cigarettes, chewing tobacco, and vaping devices, such as e-cigarettes. If you need help quitting, ask your health care provider. Keep all follow-up visits. This is important. Contact a health care provider if: You have redness or pressure sores on your head, face, mouth, or nose from the mask or head  gear. You have trouble using the CPAP or BIPAP machine. You cannot tolerate wearing the CPAP or BIPAP mask. Someone tells you that you snore even when wearing your CPAP or BIPAP. Get help right away if: You have trouble breathing. You feel confused. Summary CPAP and BIPAP are methods that use air pressure to keep your airways open and to help you breathe well. If you have trouble with the mask, it is very important that you talk with your health care provider about finding a way to make the mask easier to tolerate. Do not stop using the mask. There could be a negative impact to your health if you stop using the mask. Follow instructions from your health care provider about when to use the machine. This information is not intended to replace advice given to you by your health care provider. Make sure you discuss any questions you have with your health care provider. Document Revised:  03/13/2021 Document Reviewed: 07/13/2020 Elsevier Patient Education  2023 [employer].

## 2023-04-15 NOTE — Progress Notes (Signed)
Reviewed and agree with assessment/plan.   Coralyn Helling, MD [facility] Pulmonary/Critical Care 04/15/2023, 12:38 PM Pager:  [phone]

## 2023-04-17 NOTE — Telephone Encounter (Signed)
Patient aware that we sent the order to [facility]

## 2023-04-27 ENCOUNTER — Ambulatory Visit (INDEPENDENT_AMBULATORY_CARE_PROVIDER_SITE_OTHER): Payer: Medicare Other | Admitting: Family Medicine

## 2023-04-27 ENCOUNTER — Encounter: Payer: Self-pay | Admitting: Family Medicine

## 2023-04-27 VITALS — BP 138/70 | HR 81 | Temp 97.7°F | Ht 63.0 in | Wt 220.6 lb

## 2023-04-27 DIAGNOSIS — E785 Hyperlipidemia, unspecified: Secondary | ICD-10-CM | POA: Diagnosis not present

## 2023-04-27 DIAGNOSIS — Z1211 Encounter for screening for malignant neoplasm of colon: Secondary | ICD-10-CM

## 2023-04-27 DIAGNOSIS — F324 Major depressive disorder, single episode, in partial remission: Secondary | ICD-10-CM

## 2023-04-27 DIAGNOSIS — I10 Essential (primary) hypertension: Secondary | ICD-10-CM | POA: Diagnosis not present

## 2023-04-27 DIAGNOSIS — K760 Fatty (change of) liver, not elsewhere classified: Secondary | ICD-10-CM | POA: Insufficient documentation

## 2023-04-27 NOTE — Patient Instructions (Addendum)
Try probiotic like align   Try Flonase to see if it will help with nasal congestoin- need at least 2 week trial  CPAP may also help with energy issues  Offered labs but you wanted to hold for now   Get your cologuard done-  please let us know if you have not received your Cologuard within 3 weeks-please complete after you receive  Gradually work on weight loss once you feel better   Recommended follow up: cancel visit on 24th if you get to feeling better and see me again in 6 months in that case or sooner if you need [place]

## 2023-04-27 NOTE — Progress Notes (Signed)
Phone [phone] In person visit   Subjective:   Shelly Johnson is a 77 y.o. year old very pleasant female patient who presents for/with See problem oriented charting Chief Complaint  Patient presents with   stomach issues    Pt states she was sick all of August, Tues she states she experienced diarrhea and abd pain with nausea and abd cramps and sweats and fatigue.    Past Medical History-  Patient Active Problem List   Diagnosis Date Noted   Fatty liver 04/27/2023    Priority: Medium    Vitamin D deficiency 09/16/2022    Priority: Medium    Hyperglycemia 01/27/2020    Priority: Medium    Hyperlipidemia, unspecified 09/14/2019    Priority: Medium    Numbness and tingling of both feet 01/21/2018    Priority: Medium    Depression, major, single episode, moderate (HCC) 06/20/2017    Priority: Medium    Essential hypertension 08/23/2016    Priority: Medium    Dyspnea on exertion 08/23/2016    Priority: Medium    ANEMIA-NOS 03/16/2008    Priority: Medium    DDD (degenerative disc disease), lumbar 11/16/2018    Priority: Low   Morbid obesity (HCC) 04/28/2018    Priority: Low   Hiatal hernia with gastroesophageal reflux 03/08/2008    Priority: Low   Severe sleep apnea 04/15/2023   Pneumonia due to COVID-19 virus 03/19/2023   Snoring 12/09/2022    Medications- reviewed and updated Current Outpatient Medications  Medication Sig Dispense Refill   albuterol (VENTOLIN HFA) 108 (90 Base) MCG/ACT inhaler Inhale 2 puffs into the lungs every 6 (six) hours as needed for wheezing or shortness of breath. Use 30 minutes prior to activity. 8 g 2   chlorthalidone (HYGROTON) 25 MG tablet TAKE 1 TABLET BY MOUTH DAILY 90 tablet 2   ferrous sulfate 325 (65 FE) MG tablet Take 1 tablet (325 mg total) by mouth 2 (two) times daily. (Patient taking differently: Take 325 mg by mouth daily.) 60 tablet 3   naproxen sodium (ALEVE) 220 MG tablet Take 220 mg by mouth. As needed     omeprazole  (PRILOSEC) 40 MG capsule TAKE ONE CAPSULE BY MOUTH DAILY 90 capsule 2   vitamin C (ASCORBIC ACID) 500 MG tablet Take 500 mg by mouth daily.       VITAMIN D PO Take 1 tablet by mouth daily.     No current facility-administered medications for this visit.     Objective:  BP 138/70   Pulse 81   Temp 97.7 F (36.5 C)   Ht 5\' 3"  (1.6 m)   Wt 220 lb 9.6 oz (100.1 kg)   SpO2 97%   BMI 39.08 kg/m  Gen: NAD, resting comfortably Nasal turbinates edematous and pale consistent with allergies, some drainage in pharynx. CV: RRR no murmurs rubs or gallops Lungs: CTAB no crackles, wheeze, rhonchi Abdomen: soft/nontender diffusely today/nondistended/normal bowel sounds. No rebound or guarding.  Ext: At least trace edema bilaterally per baseline-remains very tender with palpation so difficult to get full assessment.  No unilateral calf pain reported Skin: warm, dry    Assessment and Plan   # post COVID 19  # Diarrhea/gastroenterology upset  S:patient reports feeling ill most of August- Emergency Department visit for COVID 03/19/23 (CT angiogram with no PULMONARY EMBOLISM and a few scattered tree in bud nodular opacities in RIGHT LOWER LOBE concerning for pneumoni)-  and admission considered but she felt better after over a day in  Emergency Department waiting on transfer. As was feeling better they opted to send her home- Paxlovid/remdesivir given and treated with z pack for possible secondary bacterial pneumonia. Also was diagnosed with pneumonia (mention of sinusitis pattern as well) on 03/31/23 by Alyssa.   From x-ray result note "- s/p COVID-19 lingering symptoms, really presented like sinusitis yesterday - I did go ahead and start her on doxycycline, a burst of prednisone, and albuterol to use prior to activity to help with her breathing. Her SpO2 didn't drop below 93% with exertion yesterday, so didn't feel O2 necessary at home - -meets with GI about hiatal hernia soon she says "  She has started  to feel better but then on Tuesday of last  week September 3 noted diarrhea and abdominal pain with nausea/sweating/fatigue and abdominal cramps. Had not been eating her normal diet due to fatigue- more frozen meals, sandwiches, soup- energy to low for Pre-Exposure Prophylaxis (PrEP) for HIV prevention. Also gets some body aches still rather diffusely.   A/P: Patient with lingering fatigue, body aches, mild cough and runny nose, and mental fog after COVID diagnosis 5 or 6 weeks ago.  Discussed potential long COVID and also the fact that the symptoms can commonly persist after COVID-she was both relieved and bothered by this information-she would like to feel better sooner.  Discussed updating blood work to look at fatigue which she declines. - For mild cough and runny nose mention trial of Flonase and she is agreeable to try this - She also asks about a "cancer blood test" as she feels like something is "off" including inability to recover from COVID quicker-discussed standard cancer screenings including colonoscopy or Cologuard as she at least agrees to Cologuard but discussed there is not a specific cancer blood test other than targeted testing for symptoms -She did have hypokalemia in the hospital but once again declined blood work for repeat recheck -Vitamin D has been low and we can certainly recheck that at follow-up on the 24th  As far as GI symptoms recommended probiotic-they seem to be improving and discussed could be related to prior antibiotic use x 2  Also in regards to pneumonia of the right lower lobe recheck chest x-ray was negative in mid August  #Hiatal hernia- gets intermittent epigastric pain worse at night after dinner and when lays down. Taking her omeprazole. Upcoming visit with Dr. [name]   # fatty liver-discussed diagnosis of fatty liver after this was incidentally noted on her recent CT angiogram-discussed importance of healthy eating/regular exercise/weight loss -Also with  morbid obesity with BMI over 35 and hypertension and hyperlipidemia-recommended weight loss as well  #hypertension S: medication: chlorthalidone 25 mg BP Readings from Last 3 Encounters:  04/27/23 138/70  04/15/23 124/70  03/31/23 128/80  A/P: stable (high acceptable)- continue current medicines   #hyperlipidemia S: Medication: None Lab Results  Component Value Date   CHOL 197 06/25/2022   HDL 62.20 06/25/2022   LDLCALC 110 (H) 06/25/2022   LDLDIRECT 113.0 09/16/2019   TRIG 123.0 06/25/2022   CHOLHDL 3 06/25/2022   A/P: Cholesterol certainly above ideal goal-discussed option of medicine or CT calcium scoring-she declines both  # Depression S: Medication: None    04/27/2023   11:23 AM 03/31/2023   11:34 AM 06/25/2022   11:13 AM  Depression screen PHQ 2/9  Decreased Interest 1 0 0  Down, Depressed, Hopeless 0 0 0  PHQ - 2 Score 1 0 0  Altered sleeping 0  0  Tired, decreased  energy 2  0  Change in appetite 2  0  Feeling bad or failure about yourself  0  0  Trouble concentrating 0  0  Moving slowly or fidgety/restless 0  0  Suicidal thoughts 0  0  PHQ-9 Score 5  0  Difficult doing work/chores Not difficult at all  Not difficult at all   A/P: PHQ-9 minimally elevated-discussed option of therapy particularly with multiple ongoing stressors but she declines again at this time.  Also not interested in medicine.  If has new or worsening symptoms should certainly return to see [place]  Recommended follow up: Has follow-up visit scheduled on the 24th and she wants to keep that unless she begins to feel substantially better Future Appointments  Date Time Provider Department Center  05/06/2023 10:45 AM Felecia Shelling, DPM [facility]  05/12/2023  2:20 PM Shelva Majestic, MD [facility]  05/21/2023  2:00 PM Antony Madura, MD [facility] None  06/03/2023 11:30 AM [facility]  06/10/2023 11:00 AM Hilarie Fredrickson, MD [facility]  06/10/2023  2:00 PM Glenford Bayley, NP [facility] None    Lab/Order associations:   ICD-10-CM   1. Fatty liver  K76.0     2. Screen for colon cancer  Z12.11 Cologuard    3. Essential hypertension  I10     4. Hyperlipidemia, unspecified hyperlipidemia type  E78.5     5. Depression, major, single episode, in partial remission (HCC) Chronic F32.4     6. Morbid obesity (HCC) Chronic E66.01       No orders of the defined types were placed in this encounter.   Return precautions advised.  Tana Conch, MD

## 2023-05-06 ENCOUNTER — Ambulatory Visit (INDEPENDENT_AMBULATORY_CARE_PROVIDER_SITE_OTHER): Payer: Medicare Other | Admitting: Podiatry

## 2023-05-06 DIAGNOSIS — M2041 Other hammer toe(s) (acquired), right foot: Secondary | ICD-10-CM | POA: Diagnosis not present

## 2023-05-06 NOTE — Progress Notes (Signed)
   Chief Complaint  Patient presents with   Foot Pain    Hammer toe right foot     HPI: 77 y.o. female presenting today for follow-up evaluation of pain and tenderness associated to the right second toe ongoing for several months now.  Patient states that most recently the toe has been minimally symptomatic.  Past Medical History:  Diagnosis Date   Anemia    Anxiety and depression    during loss of husband   Arthritis    Diverticulosis    GERD (gastroesophageal reflux disease)    GI bleed    hx of; hgb as low as 5.9   Hemorrhoids    Hiatal hernia    HLD (hyperlipidemia)    Obesity    Osteoarthritis    Pneumonia    Urinary tract bacterial infections       Objective: Physical Exam General: The patient is alert and oriented x3 in no acute distress.  Dermatology: Skin is cool, dry and supple bilateral lower extremities. Negative for open lesions or macerations.  Vascular: Palpable pedal pulses bilaterally. No edema or erythema noted. Capillary refill within normal limits.  Neurological: Grossly intact via light touch  Musculoskeletal Exam: All pedal and ankle joints range of motion within normal limits bilateral. Muscle strength 5/5 in all groups bilateral. Hammertoe contracture deformity noted to digits #2 of the right foot.  No associated tenderness  Radiographic Exam RT foot 01/19/2023: Hammertoe contracture deformity noted to the interphalangeal joints and MPJ of the respective hammertoe digits mentioned on clinical musculoskeletal exam.     Assessment: 1.  Asymptomatic hammertoe second digit right foot   Plan of Care:  -Patient evaluated.  -Currently the patient has no pain or tenderness associated to the toe.  Recommend continued conservative treatment -Recommend good supportive shoes and sneakers.  Advised against going barefoot -Return to clinic as needed  Felecia Shelling, DPM [facility]  Dr. Felecia Shelling, DPM    [address] [place], [place] [address]                Office [phone]  Fax [phone]

## 2023-05-08 NOTE — Progress Notes (Signed)
I saw Shelly Johnson in neurology clinic on 05/21/23 in follow up for numbness and pain in feet and hands.  HPI: Shelly Johnson is a 77 y.o. year old female with a history of severe OSA (on CPAP), HLD, OA, anxiety, and depression who we last saw on 08/28/22.  To briefly review: Patient's symptoms have been present for many years. She feels most symptoms in her right foot, but also some in her left foot. She has numbness. She describes her pain as a dull ache. Her balance is off because she does not feel as well. Her knees hurt now as a result of favoring it and weight gain. She feels like her "bones hurt". Her symptoms are constant. She has swelling at times. She even feels like she hurts at night. She thinks the symptoms have spread from big toe at onset to whole foot. Right is much more significant than the left that is just starting.   She has pain in her back, neck, and hands that she relates to arthritis. She describes soreness. She has occasionally numbness in her hands.   She describes a lot of stress and fatigue. This is mostly due to maintenance problems in her home (water leaks for 2 years). Patient thinks her sleep is okay. She gets 6-7 hours of sleep. She does not feel well rested. She thinks she snores. She has never had a sleep study.   She denies any constitutional symptoms like fever, night sweats, anorexia or unintentional weight loss. She does endorse fluctuating being hot then cold.   EtOH use: glass of wine every few days  Restrictive diet? No, endorses a non-healthy diet Family history of neuropathy/myopathy/neurologic disease? No   She has never had an EMG. She has never been on medication for her symptoms. She does not like medications. She does take Alleve that helps her foot symptoms.  Most recent Assessment and Plan (08/28/22): Shelly Johnson is a 77 y.o. female who presents for evaluation of diffuse pain, most apparent in feet (right > left). She has a  relevant medical history of HLD, OA, anxiety, and depression. Her neurological examination is pertinent for pain to palpation in all limbs, legs more significant than arms and ?diminished sensation to vibration in feet. Available diagnostic data is significant for HbA1c of 6, B12 465. Patient's symptoms are likely multifactorial with contributions from arthritis and lower extremity edema. Her rather benign neurologic examination with intact reflexes, even at the ankles, argues against a significant neuropathy that would cause such pain. She may also have a chronic pain syndrome such as fibromyalgia or central sensitization that is contributing as well. The significant stress she has been under and her poor sleep are also likely exacerbating her symptoms.   PLAN: -Blood work: IFE, ANA, ENA, Vit D (had to reassure patient as she does not like needles) -Discussed EMG, but patient deferred (does not like needles) -Sleep medicine referral for poor sleep and possible sleep study -Discussed Cymbalta for symptomatic treatment. Patient to consider and let me know if she would like to try it. I would start low at 30 mg at night.  Since their last visit: Vit D was low. I recommended Vitamin D 1000 international units (IU) daily. She is taking this.  Patient saw sleep medicine. Her sleep study showed severe sleep apnea. CPAP was recommended. She is doing CPAP. She is going back next week because her mask is leaking.  She recently had COVID and PNA. She is recovering  and having difficulty getting her energy back.   In terms of her feet, she has a lot of numbness and tingling in her feet that she thinks is worse. It is more in her right foot, but also in her left foot. She has a lot of swelling in her legs and has tenderness to touch. She has some imbalance because she can't feel her feet.    MEDICATIONS:  Outpatient Encounter Medications as of 05/21/2023  Medication Sig   albuterol (VENTOLIN HFA) 108 (90  Base) MCG/ACT inhaler Inhale 2 puffs into the lungs every 6 (six) hours as needed for wheezing or shortness of breath. Use 30 minutes prior to activity.   chlorthalidone (HYGROTON) 25 MG tablet TAKE 1 TABLET BY MOUTH DAILY   ferrous sulfate 325 (65 FE) MG tablet Take 1 tablet (325 mg total) by mouth 2 (two) times daily. (Patient taking differently: Take 325 mg by mouth daily.)   naproxen sodium (ALEVE) 220 MG tablet Take 220 mg by mouth. As needed   omeprazole (PRILOSEC) 40 MG capsule TAKE ONE CAPSULE BY MOUTH DAILY   vitamin C (ASCORBIC ACID) 500 MG tablet Take 500 mg by mouth daily.     VITAMIN D PO Take 1 tablet by mouth daily.   No facility-administered encounter medications on file as of 05/21/2023.    PAST MEDICAL HISTORY: Past Medical History:  Diagnosis Date   Anemia    Anxiety and depression    during loss of husband   Arthritis    Diverticulosis    GERD (gastroesophageal reflux disease)    GI bleed    hx of; hgb as low as 5.9   Hemorrhoids    Hiatal hernia    HLD (hyperlipidemia)    Obesity    Osteoarthritis    Pneumonia    Urinary tract bacterial infections     PAST SURGICAL HISTORY: Past Surgical History:  Procedure Laterality Date   ABDOMINAL HYSTERECTOMY     states had a "few cancer cells" but they "got everything". ? ovaries and cervix.    APPENDECTOMY     HIATAL HERNIA REPAIR      ALLERGIES: Allergies  Allergen Reactions   Nsaids     Should avoid-- GI bleed   Hydrocodone Nausea And Vomiting    FAMILY HISTORY: Family History  Problem Relation Age of Onset   Arthritis Mother    Uterine cancer Mother        uterine   Diabetes Brother        unknown cause   Hypertension Brother    Diabetes Brother        complications after amputation   Breast cancer Maternal Grandmother    Colon cancer Maternal Aunt 29   CAD Brother        medical error as reported cause of death   Lung cancer Brother        youngest   Colon polyps Neg Hx    Esophageal  cancer Neg Hx    Stomach cancer Neg Hx     SOCIAL HISTORY: Social History   Tobacco Use   Smoking status: Never   Smokeless tobacco: Never  Vaping Use   Vaping status: Never Used  Substance Use Topics   Alcohol use: Yes    Comment: 1 per every other  month/ wine   Drug use: No   Social History   Social History Narrative   Widowed 03/02/2007. 2 sons. 4 grandkids (2 girls/2 boys). Retired-from interior designingHobbies: design, family/friends time,  decorative books, yardwork   Are you right handed or left handed? right   Are you currently employed ?    What is your current occupation?retired   Do you live at home alone? yes   Who lives with you?    What type of home do you live in: 1 story or 2 story? two        Objective:  Vital Signs:  BP (!) 151/83   Pulse (!) 102   Ht 5\' 3"  (1.6 m)   Wt 221 lb (100.2 kg)   SpO2 96%   BMI 39.15 kg/m   General: General appearance: Awake and alert. No distress. Cooperative with exam.  Skin: No obvious rash or jaundice. HEENT: Atraumatic. Anicteric. Lungs: Non-labored breathing on room air  Extremities: Significant pitting peripheral edema in bilateral lower extremities Musculoskeletal: No obvious joint swelling.  Neurological: Mental Status: Alert. Speech fluent. No pseudobulbar affect Cranial Nerves: CNII: No RAPD. Visual fields intact. CNIII, IV, VI: PERRL. No nystagmus. EOMI. CN V: Facial sensation intact bilaterally to fine touch. CN VII: Facial muscles symmetric and strong. No ptosis at rest. CN VIII: Hears finger rub well bilaterally. CN IX: No hypophonia. CN X: Palate elevates symmetrically. CN XI: Full strength shoulder shrug bilaterally. CN XII: Tongue protrusion full and midline. No atrophy or fasciculations. No significant dysarthria Motor: Tone is normal. Strength is 5/5 in bilateral upper and lower extremities. Reflexes:  Right Left  Bicep 2+ 2+  Tricep 2+ 2+  BrRad 2+ 2+  Knee 2+ 2+  Ankle 2+ 2+    Sensation: Pinprick: Intact in all extremities Vibration: Absent at right great toe, intact otherwise Coordination: Intact finger-to- nose-finger bilaterally. Gait: Narrow-based gait.  Lab and Test Review: New results: 08/28/22: Vit D: 22.91 ANA, ENA: negative IFE: no M protein  Sleep study (03/06/23):  Sleep study showed severe OSA, she had average 30 events an hour.   Previously reviewed results: 06/25/22: Normal or unremarkable: CBC, CMP, TSH B12: 465 HbA1c: 6.0 (6.2 in 2009)   Imaging: CT head wo contrast (03/20/21): FINDINGS: Brain: No acute intracranial abnormality. Specifically, no hemorrhage, hydrocephalus, mass lesion, acute infarction, or significant intracranial injury.   Vascular: No hyperdense vessel or unexpected calcification.   Skull: No acute calvarial abnormality.   Sinuses/Orbits: No acute findings   Other: None   IMPRESSION: Normal study.  ASSESSMENT: This is Shelly Johnson, a 77 y.o. female with diffuse pain, most prominent in bilateral feet (right > left). Patient's symptoms are likely multifactorial with contributions from arthritis and lower extremity edema. Her rather benign neurologic examination with intact reflexes, even at the ankles, argues against a significant neuropathy that would cause such pain. She may also have a chronic pain syndrome such as fibromyalgia or central sensitization that is contributing as well. The significant stress she has been under and her poor sleep and severe OSA are also likely exacerbating her symptoms.   Plan: -Again discussed EMG. Patient is not a fan of needles. Patient will think about it and call if she wants to do it. -Also discussed Cymbalta again, patient would like to think about it. -Discussed fall precautions -Patient to discuss peripheral edema with PCP -Continue Vitamin D 1000 international units (IU) daily   Return to clinic as needed (if she wants further testing or treatment)  Total time  spent reviewing records, interview, history/exam, documentation, and coordination of care on day of encounter:  35 min  Jacquelyne Balint, MD

## 2023-05-12 ENCOUNTER — Ambulatory Visit (INDEPENDENT_AMBULATORY_CARE_PROVIDER_SITE_OTHER): Payer: Medicare Other | Admitting: Family Medicine

## 2023-05-12 ENCOUNTER — Encounter: Payer: Self-pay | Admitting: Family Medicine

## 2023-05-12 VITALS — BP 120/78 | HR 92 | Temp 97.8°F | Ht 63.0 in | Wt 220.2 lb

## 2023-05-12 DIAGNOSIS — I10 Essential (primary) hypertension: Secondary | ICD-10-CM | POA: Diagnosis not present

## 2023-05-12 DIAGNOSIS — F325 Major depressive disorder, single episode, in full remission: Secondary | ICD-10-CM

## 2023-05-12 DIAGNOSIS — E559 Vitamin D deficiency, unspecified: Secondary | ICD-10-CM

## 2023-05-12 DIAGNOSIS — M542 Cervicalgia: Secondary | ICD-10-CM | POA: Diagnosis not present

## 2023-05-12 DIAGNOSIS — M545 Low back pain, unspecified: Secondary | ICD-10-CM | POA: Diagnosis not present

## 2023-05-12 DIAGNOSIS — G8929 Other chronic pain: Secondary | ICD-10-CM | POA: Diagnosis not present

## 2023-05-12 DIAGNOSIS — Z1211 Encounter for screening for malignant neoplasm of colon: Secondary | ICD-10-CM | POA: Diagnosis not present

## 2023-05-12 NOTE — Patient Instructions (Addendum)
Long term issues with both back and neck pain likely underlying arthritis -offered x-rays but wants to hold off for now -she is open to physical therapy -if not making progress within a few sessions would recommend getting sports medicine consult   Schedule physical therapy at the desk  I suggest myfitnesspal or I fyou change your mind could refer to healthy weight to wellness Use 0.5 pounds per week weight loss goal (or up to 1 pounds if needed) Set a reasonable goal such as 5-10 lbs and can reset goal once you reach the goal Do not connect your step counter to this- watch or phone Update me in 2-3 months with how you are doing  Recommended follow up: Return in about 6 months (around 11/09/2023) for followup or sooner if needed.Schedule b4 you leave.

## 2023-05-12 NOTE — Progress Notes (Addendum)
Phone [phone] In person visit   Subjective:   Shelly Johnson is a 77 y.o. year old very pleasant female patient who presents for/with See problem oriented charting Chief Complaint  Patient presents with   Medical Management of Chronic Issues   Hyperlipidemia   Hypertension   Back Pain    Pt c/o back and neck pain and hand pain that's getting worse.   Nutrition Counseling    Pt would like help with starting a diet    Past Medical History-  Patient Active Problem List   Diagnosis Date Noted   Fatty liver 04/27/2023    Priority: Medium    Severe sleep apnea 04/15/2023    Priority: Medium    Vitamin D deficiency 09/16/2022    Priority: Medium    Hyperglycemia 01/27/2020    Priority: Medium    Hyperlipidemia, unspecified 09/14/2019    Priority: Medium    Numbness and tingling of both feet 01/21/2018    Priority: Medium    Depression, major, single episode, moderate (HCC) 06/20/2017    Priority: Medium    Essential hypertension 08/23/2016    Priority: Medium    Dyspnea on exertion 08/23/2016    Priority: Medium    ANEMIA-NOS 03/16/2008    Priority: Medium    DDD (degenerative disc disease), lumbar 11/16/2018    Priority: Low   Morbid obesity (HCC) 04/28/2018    Priority: Low   Hiatal hernia with gastroesophageal reflux 03/08/2008    Priority: Low   Pneumonia due to COVID-19 virus 03/19/2023    Medications- reviewed and updated Current Outpatient Medications  Medication Sig Dispense Refill   albuterol (VENTOLIN HFA) 108 (90 Base) MCG/ACT inhaler Inhale 2 puffs into the lungs every 6 (six) hours as needed for wheezing or shortness of breath. Use 30 minutes prior to activity. 8 g 2   chlorthalidone (HYGROTON) 25 MG tablet TAKE 1 TABLET BY MOUTH DAILY 90 tablet 2   ferrous sulfate 325 (65 FE) MG tablet Take 1 tablet (325 mg total) by mouth 2 (two) times daily. (Patient taking differently: Take 325 mg by mouth daily.) 60 tablet 3   naproxen sodium (ALEVE) 220 MG  tablet Take 220 mg by mouth. As needed     omeprazole (PRILOSEC) 40 MG capsule TAKE ONE CAPSULE BY MOUTH DAILY 90 capsule 2   vitamin C (ASCORBIC ACID) 500 MG tablet Take 500 mg by mouth daily.       VITAMIN D PO Take 1 tablet by mouth daily.     No current facility-administered medications for this visit.     Objective:  BP 120/78   Pulse 92   Temp 97.8 F (36.6 C)   Ht 5\' 3"  (1.6 m)   Wt 220 lb 3.2 oz (99.9 kg)   SpO2 96%   BMI 39.01 kg/m  Gen: NAD, resting comfortably CV: RRR no murmurs rubs or gallops Lungs: CTAB no crackles, wheeze, rhonchi Ext: no edema Skin: warm, dry Neuro: 5/5 strength in bilateral lower extremities. No midline back pain- does have pain in paraspinious muscles- similar in neck- no midline back pain    Assessment and Plan   #social update- a lot of stress with things she has to do- she does not want to pursue therapy or life coaching  # Back pain and neck pain S: Patient reports back and neck pain as well as hand pain that seems to be worsening -At last visit about 2 weeks ago patient complained of lingering fatigue, body aches, mild  cough and runny nose and mental fog after COVID-19 diagnosis 5 to 6 weeks prior but reports had been hurting even prior to that -she also plans to see eye doctor as has had some headache(s) and wonders if that may help -not interested in EMG or Cymbalta -feels weakness due to pain and has numbness/tinging  ROS-No saddle anesthesia, new bladder incontinence, fecal incontinence. History negative for trauma, history of cancer, fever, chills, unintentional weight loss, recent bacterial infection, recent IV drug use, HIV, pain worse at night or while supine.    A/P:Long term issues with both back and neck pain likely underlying arthritis -offered x-rays but wants to hold off for now -she is open to physical therapy -if not making progress within a few sessions would recommend getting sports medicine consult  -massage is not  a good fit for her  # Obesity S: Patient is interested in having help in starting of diet. Shed like to lose weight but finds this challenging with other stressors on her plate.  Wt Readings from Last 3 Encounters:  05/12/23 220 lb 3.2 oz (99.9 kg)  04/27/23 220 lb 9.6 oz (100.1 kg)  04/15/23 217 lb 9.6 oz (98.7 kg)  A/P: she wants to try to make some meal plans at home and trial that and if not improving willing to consider healthy weight to wellness.  Could try myfitnesspal  #hypertension S: medication: chlorthalidone 25 mg BP Readings from Last 3 Encounters:  05/12/23 120/78  04/27/23 138/70  04/15/23 124/70  A/P: stable- continue current medicines   #hyperlipidemia S: Medication:none  Lab Results  Component Value Date   CHOL 197 06/25/2022   HDL 62.20 06/25/2022   LDLCALC 110 (H) 06/25/2022   LDLDIRECT 113.0 09/16/2019   TRIG 123.0 06/25/2022   CHOLHDL 3 06/25/2022   A/P: prefers to stay off medications and declines CT calcium scoring- will at least check TSH- not quite due for full lipid panel  #Vitamin D deficiency S: Medication: at least 1000 units a day Last vitamin D Lab Results  Component Value Date   VD25OH 22.91 (L) 08/28/2022  A/P: hopefully improved- update next visit if she's willing- wanted to hold off today   #OSA- plans to start this- just received. Has been using it. She states hard to use.   # Depression S: Medication:none     04/27/2023   11:23 AM 03/31/2023   11:34 AM 06/25/2022   11:13 AM  Depression screen PHQ 2/9  Decreased Interest 1 0 0  Down, Depressed, Hopeless 0 0 0  PHQ - 2 Score 1 0 0  Altered sleeping 0  0  Tired, decreased energy 2  0  Change in appetite 2  0  Feeling bad or failure about yourself  0  0  Trouble concentrating 0  0  Moving slowly or fidgety/restless 0  0  Suicidal thoughts 0  0  PHQ-9 Score 5  0  Difficult doing work/chores Not difficult at all  Not difficult at all  A/P: full remission- just reports a lot of  stress which can make getting everything done she needs to do challenging. Continue without medicine but did encourage therapist as above  #Health maintenance -submitted cologuard this morning  Recommended follow up: Return in about 6 months (around 11/09/2023) for followup or sooner if needed.Schedule b4 you leave. Future Appointments  Date Time Provider Department Center  05/21/2023  2:00 PM Antony Madura, MD [facility] None  06/03/2023 11:30 AM [facility] 3 [facility]  [facility]  06/10/2023 11:00 AM Hilarie Fredrickson, MD [facility]  06/10/2023  2:00 PM Glenford Bayley, NP [facility] None    Lab/Order associations:   ICD-10-CM   1. Essential hypertension  I10     2. Vitamin D deficiency  E55.9     3. Chronic bilateral low back pain without sciatica  M54.50 Ambulatory referral to Physical Therapy   G89.29     4. Neck pain  M54.2 Ambulatory referral to Physical Therapy    5. Major depressive disorder with single episode, in full remission (HCC) Chronic F32.5      No orders of the defined types were placed in this encounter.   Return precautions advised.  Tana Conch, MD

## 2023-05-13 DIAGNOSIS — H2513 Age-related nuclear cataract, bilateral: Secondary | ICD-10-CM | POA: Diagnosis not present

## 2023-05-13 DIAGNOSIS — H5203 Hypermetropia, bilateral: Secondary | ICD-10-CM | POA: Diagnosis not present

## 2023-05-13 DIAGNOSIS — H1789 Other corneal scars and opacities: Secondary | ICD-10-CM | POA: Diagnosis not present

## 2023-05-16 LAB — COLOGUARD: COLOGUARD: NEGATIVE

## 2023-05-20 ENCOUNTER — Ambulatory Visit: Payer: Medicare Other | Admitting: Physical Therapy

## 2023-05-20 ENCOUNTER — Encounter: Payer: Self-pay | Admitting: Physical Therapy

## 2023-05-20 DIAGNOSIS — M6281 Muscle weakness (generalized): Secondary | ICD-10-CM

## 2023-05-20 DIAGNOSIS — M542 Cervicalgia: Secondary | ICD-10-CM | POA: Diagnosis not present

## 2023-05-20 DIAGNOSIS — M5459 Other low back pain: Secondary | ICD-10-CM | POA: Diagnosis not present

## 2023-05-20 NOTE — Therapy (Unsigned)
OUTPATIENT PHYSICAL THERAPY LOWER EXTREMITY EVALUATION   Patient Name: Shelly Johnson MRN: [id number] DOB:[birth year], 77 y.o., female Today's Date: 05/20/2023  END OF SESSION:  PT End of Session - 05/20/23 1218     Visit Number 1    Number of Visits 16    Date for PT Re-Evaluation 07/15/23    Authorization Type Medicare    PT Start Time 1106    PT Stop Time 1152    PT Time Calculation (min) 46 min    Activity Tolerance Patient tolerated treatment well    Behavior During Therapy WFL for tasks assessed/performed             Past Medical History:  Diagnosis Date   Anemia    Anxiety and depression    during loss of husband   Arthritis    Diverticulosis    GERD (gastroesophageal reflux disease)    GI bleed    hx of; hgb as low as 5.9   Hemorrhoids    Hiatal hernia    HLD (hyperlipidemia)    Obesity    Osteoarthritis    Pneumonia    Urinary tract bacterial infections    Past Surgical History:  Procedure Laterality Date   ABDOMINAL HYSTERECTOMY     states had a "few cancer cells" but they "got everything". ? ovaries and cervix.    APPENDECTOMY     HIATAL HERNIA REPAIR     Patient Active Problem List   Diagnosis Date Noted   Fatty liver 04/27/2023   Severe sleep apnea 04/15/2023   Pneumonia due to COVID-19 virus 03/19/2023   Vitamin D deficiency 09/16/2022   Hyperglycemia 01/27/2020   Hyperlipidemia, unspecified 09/14/2019   DDD (degenerative disc disease), lumbar 11/16/2018   Morbid obesity (HCC) 04/28/2018   Numbness and tingling of both feet 01/21/2018   Depression, major, single episode, moderate (HCC) 06/20/2017   Essential hypertension 08/23/2016   Dyspnea on exertion 08/23/2016   ANEMIA-NOS 03/16/2008   Hiatal hernia with gastroesophageal reflux 03/08/2008    PCP: Tana Conch  REFERRING PROVIDER: Tana Conch  REFERRING DIAG: Low back pain, neck pain   THERAPY DIAG:  Other low back pain  Cervicalgia  Muscle weakness  (generalized)  Rationale for Evaluation and Treatment: Rehabilitation  ONSET DATE:    SUBJECTIVE:   SUBJECTIVE STATEMENT: "I hurt from my neck to my toes" Pt states neck and back and lower leg pain.  Back hurts on both sides, all the way across, did have pain into R leg at one time, but no more pain into LE.  Constant pain in back- can't get comfortable, sit too long or stand too long causes pain.  Neck: States whole neck hurts, achey, hears it cracking. No numb/tingling into UEs.  States Bil feet numb, swollen, R has been swollen for a couple years, L is now starting. States both lower legs are very tender to touch. Has not tried compression socks, is seeing pulmonology. Just started using CPAP at night. Notes that she feels very out of breath when walking to mail box more recently. She likes to walk, used to walk in the past, but doesn't anymore because of feet and legs. She also states significant amount of stress in the last couple years. She  Tries to stretch in back and legs, but realizes she has been less active.  Had covid at end of August with pneumonia.    PERTINENT HISTORY: HTN, sleep apnea, hysterectomy, anxiety,   PAIN:  Are you having pain? Yes:  NPRS scale:  6-7 /10 Pain location: low back  Pain description: achey/constant  Aggravating factors: unable to state, hurts most of the time . Relieving factors: alieve- 0-2 per day.   Are you having pain? Yes: NPRS scale: 5-6/10 Pain location: all over/neck Pain description: achey, sore , cracks  Aggravating factors: none stated  Relieving factors: none stated    PRECAUTIONS: None  WEIGHT BEARING RESTRICTIONS: No  FALLS:  Has patient fallen in last 6 months? No   PLOF: Independent  PATIENT GOALS:  Decreased pain in back and neck.   NEXT MD VISIT:   OBJECTIVE:   DIAGNOSTIC FINDINGS:   PATIENT SURVEYS:  Foto: initial:/back:    COGNITION: Overall cognitive status: Within functional limits for tasks  assessed     SENSATION: WFL  EDEMA: R>L mild/moderate in lower leg, ankle and foot.    POSTURE:   No Significant postural limitations  PALPATION:    lower legs: moderate sensitivity/tenderness to light palpation with Swelling and mild redness in R lower leg.  Neck: tender in bil cervical paraspinals and c-spine , tightness and soreness in bil UT s. Back: tender and tight in bil lumbar region bilaterally, some at T/L junction.   LOWER EXTREMITY ROM: Neck ROM: WFL, mild limitation for extension,  sore with rotation  Shoulders: WFL, limited behind the back IR.  Lumbar: Flexion/ext: mild limitations,  SB: mild/mod limitations with tightness in bil QL Knees/hips: WFL   LOWER EXTREMITY MMT:  MMT Left eval Right  eval  Hip flexion 4 4  Hip extension    Hip abduction 4 4  Hip adduction    Hip internal rotation    Hip external rotation    Knee flexion 4+ 4+  Knee extension 4+ 4+  Ankle dorsiflexion    Ankle plantarflexion    Ankle inversion    Ankle eversion     (Blank rows = not tested)   LOWER EXTREMITY SPECIAL TESTS:  Neg SLR  FUNCTIONAL TESTS:   GAIT:   TODAY'S TREATMENT:                                                                                                                              DATE:   05/20/2023   Therapeutic Exercise: Aerobic: Supine: Seated: Standing: Stretches:  LTR x 10, slow, 5 sec holds.  Neuromuscular Re-education: Manual Therapy: Therapeutic Activity: Self Care: Vitals: BP: 139/84, pulse 85, O2: 96 after sitting up from supine position at end of session.     PATIENT EDUCATION:  Education details: PT POC, Exam findings, HEP, discussed elevation to reduce swelling in lower legs.  Person educated: Patient Education method: Explanation, Demonstration, Tactile cues, Verbal cues, and Handouts Education comprehension: verbalized understanding, returned demonstration, verbal cues required, tactile cues required, and needs further  education   HOME EXERCISE PROGRAM: Access Code: [email or web address] URL: [email or web address] Date: 05/20/2023 Prepared by: Sedalia Muta  Exercises - Supine Lower Trunk Rotation  - 1-2 x daily -  10 reps - 5 hold   ASSESSMENT:  CLINICAL IMPRESSION: Patient presents with primary complaint of ongoing pain in low back, as well as neck.She has decreased ROM for lumbar spine, with increased pain. She also has decreased core and hip strength, and poor movement quality with bending. She has increased pain in neck and will benefit from postural education and training. She also has increased swelling in bil lower legs, and will benefit from eduction on ways to reduce swelling for improving sensation and balance with walking.  Pt with several deficits that will benefit from PT.  Pt with decreased ability for full functional activities. Pt will  benefit from skilled PT to improve deficits and pain and to return to PLOF. Pt with mild nausea after sitting up from laying down position. Vitals checked, see above, stable. Pt symptoms subsided within 2-3 min, and pt was asymptomatic upon leaving clinic.   OBJECTIVE IMPAIRMENTS: decreased activity tolerance, decreased balance, decreased mobility, difficulty walking, decreased ROM, decreased strength, decreased safety awareness, increased edema, increased muscle spasms, impaired flexibility, improper body mechanics, and pain.   ACTIVITY LIMITATIONS: bending, standing, squatting, stairs, transfers, hygiene/grooming, and locomotion level  PARTICIPATION LIMITATIONS: meal prep, cleaning, laundry, driving, shopping, and community activity  PERSONAL FACTORS: Time since onset of injury/illness/exacerbation are also affecting patient's functional outcome.   REHAB POTENTIAL: Good  CLINICAL DECISION MAKING: Evolving/moderate complexity  EVALUATION COMPLEXITY: Moderate   GOALS: Goals reviewed with patient? Yes  SHORT TERM GOALS: Target date:  06/03/2023  Pt to be independent with initial HEP  Goal status: INITIAL   LONG TERM GOALS: Target date: 07/15/2023   Pt to be independent with final HEP  Goal status: INITIAL  2.  Pt to demo improved pain in low back to 0-4/10 with activity and IADLs.   Goal status: INITIAL  3.  Pt to report decreased pain in neck to 0-3/10 with activity and ADLs.   Goal status: INITIAL  4.  Pt to demo improved strength of hips and knees to be at least 4+/5, to improve pain and mobility .   Goal status: INITIAL  5.  Pt to demo ROM for lumbar spine to be [facility] with minimal pain, to improve ability for ADLs and IADLS.   Goal status: INITIAL  6. Pt to demo ability and optimal mechanics for bend, lift, for IADLs, to improve back pain with housework.    PLAN:  PT FREQUENCY: 1-2x/week  PT DURATION: 8 weeks  PLANNED INTERVENTIONS: Therapeutic exercises, Therapeutic activity, Neuromuscular re-education, Patient/Family education, Self Care, Joint mobilization, Joint manipulation, Stair training, Orthotic/Fit training, DME instructions, Aquatic Therapy, Dry Needling, Electrical stimulation, Cryotherapy, Moist heat, Taping, Ultrasound, Ionotophoresis 4mg /ml Dexamethasone, Manual therapy,  Vasopneumatic device, Traction, Spinal manipulation, Spinal mobilization,Balance training, Gait training,   PLAN FOR NEXT SESSION:   Sedalia Muta, PT, DPT 12:56 PM  05/21/23

## 2023-05-21 ENCOUNTER — Ambulatory Visit (INDEPENDENT_AMBULATORY_CARE_PROVIDER_SITE_OTHER): Payer: Medicare Other | Admitting: Neurology

## 2023-05-21 ENCOUNTER — Encounter: Payer: Self-pay | Admitting: Neurology

## 2023-05-21 VITALS — BP 136/80 | HR 102 | Ht 63.0 in | Wt 221.0 lb

## 2023-05-21 DIAGNOSIS — E559 Vitamin D deficiency, unspecified: Secondary | ICD-10-CM | POA: Diagnosis not present

## 2023-05-21 DIAGNOSIS — R209 Unspecified disturbances of skin sensation: Secondary | ICD-10-CM | POA: Diagnosis not present

## 2023-05-21 DIAGNOSIS — R52 Pain, unspecified: Secondary | ICD-10-CM | POA: Diagnosis not present

## 2023-05-21 DIAGNOSIS — G4733 Obstructive sleep apnea (adult) (pediatric): Secondary | ICD-10-CM

## 2023-05-21 DIAGNOSIS — R5383 Other fatigue: Secondary | ICD-10-CM | POA: Diagnosis not present

## 2023-05-21 DIAGNOSIS — R600 Localized edema: Secondary | ICD-10-CM | POA: Diagnosis not present

## 2023-05-21 DIAGNOSIS — Z72820 Sleep deprivation: Secondary | ICD-10-CM

## 2023-05-21 DIAGNOSIS — R200 Anesthesia of skin: Secondary | ICD-10-CM | POA: Diagnosis not present

## 2023-05-21 NOTE — Patient Instructions (Addendum)
I saw you today for the pain in your legs. Your legs are very swollen today. You should discuss this with your primary care doctor.  In terms of the tingling and numbness in your legs, this could be nerve damage or a pinched nerve.  We discussed doing a nerve test called EMG. You would like to think about this more. Below is more information on the test.  We also discussed a pain medication call Cymbalta. You would like to think about this as well.  If you would like the test and/or the medication, give our office a call.  The physicians and staff at [facility] are committed to providing excellent care. You may receive a survey requesting feedback about your experience at our office. We strive to receive "very good" responses to the survey questions. If you feel that your experience would prevent you from giving the office a "very good " response, please contact our office to try to remedy the situation. We may be reached at [phone]. Thank you for taking the time out of your busy day to complete the survey.  Jacquelyne Balint, MD [facility]    ELECTROMYOGRAM AND NERVE CONDUCTION STUDIES (EMG/NCS) INSTRUCTIONS  How to Prepare The neurologist conducting the EMG will need to know if you have certain medical conditions. Tell the neurologist and other EMG lab personnel if you: Have a pacemaker or any other electrical medical device Take blood-thinning medications Have hemophilia, a blood-clotting disorder that causes prolonged bleeding Bathing Take a shower or bath shortly before your exam in order to remove oils from your skin. Don't apply lotions or creams before the exam.  What to Expect You'll likely be asked to change into a hospital gown for the procedure and lie down on an examination table. The following explanations can help you understand what will happen during the exam.  Electrodes. The neurologist or a technician places surface electrodes at various locations on your  skin depending on where you're experiencing symptoms. Or the neurologist may insert needle electrodes at different sites depending on your symptoms.  Sensations. The electrodes will at times transmit a tiny electrical current that you may feel as a twinge or spasm. The needle electrode may cause discomfort or pain that usually ends shortly after the needle is removed. If you are concerned about discomfort or pain, you may want to talk to the neurologist about taking a short break during the exam.  Instructions. During the needle EMG, the neurologist will assess whether there is any spontaneous electrical activity when the muscle is at rest - activity that isn't present in healthy muscle tissue - and the degree of activity when you slightly contract the muscle.  He or she will give you instructions on resting and contracting a muscle at appropriate times. Depending on what muscles and nerves the neurologist is examining, he or she may ask you to change positions during the exam.  After your EMG You may experience some temporary, minor bruising where the needle electrode was inserted into your muscle. This bruising should fade within several days. If it persists, contact your primary care doctor.    Preventing Falls at [facility] are common, often dreaded events in the lives of older people. Aside from the obvious injuries and even death that may result, fall can cause wide-ranging consequences including loss of independence, mental decline, decreased activity and mobility. Younger people are also at risk of falling, especially those with chronic illnesses and fatigue.  Ways to reduce  risk for falling Examine diet and medications. Warm foods and alcohol dilate blood vessels, which can lead to dizziness when standing. Sleep aids, antidepressants and pain medications can also increase the likelihood of a fall.  Get a vision exam. Poor vision, cataracts and glaucoma increase the chances of  falling.  Check foot gear. Shoes should fit snugly and have a sturdy, nonskid sole and a broad, low heel  Participate in a physician-approved exercise program to build and maintain muscle strength and improve balance and coordination. Programs that use ankle weights or stretch bands are excellent for muscle-strengthening. Water aerobics programs and low-impact Tai Chi programs have also been shown to improve balance and coordination.  Increase vitamin D intake. Vitamin D improves muscle strength and increases the amount of calcium the body is able to absorb and deposit in bones.  How to prevent falls from common hazards Floors - Remove all loose wires, cords, and throw rugs. Minimize clutter. Make sure rugs are anchored and smooth. Keep furniture in its usual place.  Chairs -- Use chairs with straight backs, armrests and firm seats. Add firm cushions to existing pieces to add height.  Bathroom - Install grab bars and non-skid tape in the tub or shower. Use a bathtub transfer bench or a shower chair with a back support Use an elevated toilet seat and/or safety rails to assist standing from a low surface. Do not use towel racks or bathroom tissue holders to help you stand.  Lighting - Make sure halls, stairways, and entrances are well-lit. Install a night light in your bathroom or hallway. Make sure there is a light switch at the top and bottom of the staircase. Turn lights on if you get up in the middle of the night. Make sure lamps or light switches are within reach of the bed if you have to get up during the night.  Kitchen - Install non-skid rubber mats near the sink and stove. Clean spills immediately. Store frequently used utensils, pots, pans between waist and eye level. This helps prevent reaching and bending. Sit when getting things out of lower cupboards.  Living room/ Bedrooms - Place furniture with wide spaces in between, giving enough room to move around. Establish a route through the  living room that gives you something to hold onto as you walk.  Stairs - Make sure treads, rails, and rugs are secure. Install a rail on both sides of the stairs. If stairs are a threat, it might be helpful to arrange most of your activities on the lower level to reduce the number of times you must climb the stairs.  Entrances and doorways - Install metal handles on the walls adjacent to the doorknobs of all doors to make it more secure as you travel through the doorway.  Tips for maintaining balance Keep at least one hand free at all times. Try using a backpack or fanny pack to hold things rather than carrying them in your hands. Never carry objects in both hands when walking as this interferes with keeping your balance.  Attempt to swing both arms from front to back while walking. This might require a conscious effort if Parkinson's disease has diminished your movement. It will, however, help you to maintain balance and posture, and reduce fatigue.  Consciously lift your feet off of the ground when walking. Shuffling and dragging of the feet is a common culprit in losing your balance.  When trying to navigate turns, use a "U" technique of facing forward and making  a wide turn, rather than pivoting sharply.  Try to stand with your feet shoulder-length apart. When your feet are close together for any length of time, you increase your risk of losing your balance and falling.  Do one thing at a time. Don't try to walk and accomplish another task, such as reading or looking around. The decrease in your automatic reflexes complicates motor function, so the less distraction, the better.  Do not wear rubber or gripping soled shoes, they might "catch" on the floor and cause tripping.  Move slowly when changing positions. Use deliberate, concentrated movements and, if needed, use a grab bar or walking aid. Count 15 seconds between each movement. For example, when rising from a seated position, wait 15  seconds after standing to begin walking.  If balance is a continuous problem, you might want to consider a walking aid such as a cane, walking stick, or walker. Once you've mastered walking with help, you might be ready to try it on your own again.

## 2023-05-27 ENCOUNTER — Ambulatory Visit (INDEPENDENT_AMBULATORY_CARE_PROVIDER_SITE_OTHER): Payer: Medicare Other | Admitting: Physical Therapy

## 2023-05-27 ENCOUNTER — Encounter: Payer: Self-pay | Admitting: Physical Therapy

## 2023-05-27 DIAGNOSIS — M542 Cervicalgia: Secondary | ICD-10-CM

## 2023-05-27 DIAGNOSIS — M5459 Other low back pain: Secondary | ICD-10-CM | POA: Diagnosis not present

## 2023-05-27 DIAGNOSIS — M6281 Muscle weakness (generalized): Secondary | ICD-10-CM

## 2023-05-27 NOTE — Therapy (Signed)
OUTPATIENT PHYSICAL THERAPY LOWER EXTREMITY EVALUATION   Patient Name: Shelly Johnson MRN: [id number] DOB:1946/06/06, 77 y.o., female Today's Date: 05/27/2023  END OF SESSION:  PT End of Session - 05/27/23 1233     Visit Number 2    Number of Visits 16    Date for PT Re-Evaluation 07/15/23    Authorization Type Medicare    PT Start Time 1105    PT Stop Time 1145    PT Time Calculation (min) 40 min    Activity Tolerance Patient tolerated treatment well    Behavior During Therapy WFL for tasks assessed/performed              Past Medical History:  Diagnosis Date   Anemia    Anxiety and depression    during loss of husband   Arthritis    Diverticulosis    GERD (gastroesophageal reflux disease)    GI bleed    hx of; hgb as low as 5.9   Hemorrhoids    Hiatal hernia    HLD (hyperlipidemia)    Obesity    Osteoarthritis    Pneumonia    Urinary tract bacterial infections    Past Surgical History:  Procedure Laterality Date   ABDOMINAL HYSTERECTOMY     states had a "few cancer cells" but they "got everything". ? ovaries and cervix.    APPENDECTOMY     HIATAL HERNIA REPAIR     Patient Active Problem List   Diagnosis Date Noted   Fatty liver 04/27/2023   Severe sleep apnea 04/15/2023   Pneumonia due to COVID-19 virus 03/19/2023   Vitamin D deficiency 09/16/2022   Hyperglycemia 01/27/2020   Hyperlipidemia, unspecified 09/14/2019   DDD (degenerative disc disease), lumbar 11/16/2018   Morbid obesity (HCC) 04/28/2018   Numbness and tingling of both feet 01/21/2018   Depression, major, single episode, moderate (HCC) 06/20/2017   Essential hypertension 08/23/2016   Dyspnea on exertion 08/23/2016   ANEMIA-NOS 03/16/2008   Hiatal hernia with gastroesophageal reflux 03/08/2008    PCP: Tana Conch  REFERRING PROVIDER: Tana Conch  REFERRING DIAG: Low back pain, neck pain   THERAPY DIAG:  Other low back pain  Cervicalgia  Muscle weakness  (generalized)  Rationale for Evaluation and Treatment: Rehabilitation  ONSET DATE:    SUBJECTIVE:   SUBJECTIVE STATEMENT: 05/27/2023  Saw neurologist, no new information to report. Did get new cpap mask, thinks it is helping. Feels very tight and stiff in legs and back. Feet swollen.   Eval:  "I hurt from my neck to my toes" Pt states neck and back and lower leg pain.  Back hurts on both sides, all the way across, did have pain into R leg at one time, but no more pain into LE.  Constant pain in back- can't get comfortable, sit too long or stand too long causes pain.  Neck: States whole neck hurts, achey, hears it cracking. No numb/tingling into UEs.  States Bil feet numb, swollen, R has been swollen for a couple years, L is now starting. States both lower legs are very tender to touch. Has not tried compression socks, is seeing pulmonology. Just started using CPAP at night. Notes that she feels very out of breath when walking to mail box more recently. She likes to walk, used to walk in the past, but doesn't anymore because of feet and legs. She also states significant amount of stress in the last couple years. She  Tries to stretch in back and legs, but  realizes she has been less active.  Had covid at end of August with pneumonia.    PERTINENT HISTORY: HTN, sleep apnea, hysterectomy, anxiety,   PAIN:  Are you having pain? Yes: NPRS scale:  6-7 /10 Pain location: low back  Pain description: achey/constant  Aggravating factors: unable to state, hurts most of the time . Relieving factors: alieve- 0-2 per day.   Are you having pain? Yes: NPRS scale: 5-6/10 Pain location: all over/neck Pain description: achey, sore , cracks  Aggravating factors: none stated  Relieving factors: none stated    PRECAUTIONS: None  WEIGHT BEARING RESTRICTIONS: No  FALLS:  Has patient fallen in last 6 months? No   PLOF: Independent  PATIENT GOALS:  Decreased pain in back and neck.   NEXT MD  VISIT:   OBJECTIVE:   DIAGNOSTIC FINDINGS:   PATIENT SURVEYS:  Foto: initial:/back:    COGNITION: Overall cognitive status: Within functional limits for tasks assessed     SENSATION: WFL  EDEMA: R>L mild/moderate in lower leg, ankle and foot.    POSTURE:   No Significant postural limitations  PALPATION:    lower legs: moderate sensitivity/tenderness to light palpation with Swelling and mild redness in R lower leg.  Neck: tender in bil cervical paraspinals and c-spine , tightness and soreness in bil UT s. Back: tender and tight in bil lumbar region bilaterally, some at T/L junction.   LOWER EXTREMITY ROM: Neck ROM: WFL, mild limitation for extension,  sore with rotation  Shoulders: WFL, limited behind the back IR.  Lumbar: Flexion/ext: mild limitations,  SB: mild/mod limitations with tightness in bil QL Knees/hips: WFL   LOWER EXTREMITY MMT:  MMT Left eval Right  eval  Hip flexion 4 4  Hip extension    Hip abduction 4 4  Hip adduction    Hip internal rotation    Hip external rotation    Knee flexion 4+ 4+  Knee extension 4+ 4+  Ankle dorsiflexion    Ankle plantarflexion    Ankle inversion    Ankle eversion     (Blank rows = not tested)   LOWER EXTREMITY SPECIAL TESTS:  Neg SLR  FUNCTIONAL TESTS:   GAIT:   TODAY'S TREATMENT:                                                                                                                              DATE:   05/27/2023  Therapeutic Exercise: Aerobic: Supine: Seated: LAQ x 10 bil; ankle pumps x 10 bil;  Standing:  heel raises x 15,   marching x 15,  squat a counter x 10 - education for HEP Stretches:  LTR x 10, slow, 5 sec holds.  SKTC with towel 20 sec x 3 bil;   Seated lumbar flexion ball rolls x 5;   Pelvic tilts x 10;  Neuromuscular Re-education: Manual Therapy: Therapeutic Activity: Self Care:   Pulse: 78,   O2: 97.  After sitting up  from supine.   PATIENT EDUCATION:  Education  details:updated and reviewed HEP Person educated: Patient Education method: Explanation, Demonstration, Tactile cues, Verbal cues, and Handouts Education comprehension: verbalized understanding, returned demonstration, verbal cues required, tactile cues required, and needs further education   HOME EXERCISE PROGRAM: Access Code: [email or web address]    ASSESSMENT:  CLINICAL IMPRESSION: Pt with good ability for ther ex today, educated on initial HEP. Reviewed mechanics for a few exercises she is already doing standing at counter. Plan to progress lumbar mobility and LE strength as able, for low back pain.    Eval: Patient presents with primary complaint of ongoing pain in low back, as well as neck.She has decreased ROM for lumbar spine, with increased pain. She also has decreased core and hip strength, and poor movement quality with bending. She has increased pain in neck and will benefit from postural education and training. She also has increased swelling in bil lower legs, and will benefit from eduction on ways to reduce swelling for improving sensation and balance with walking.  Pt with several deficits that will benefit from PT.  Pt with decreased ability for full functional activities. Pt will  benefit from skilled PT to improve deficits and pain and to return to PLOF. Pt with mild nausea after sitting up from laying down position. Vitals checked, see above, stable. Pt symptoms subsided within 2-3 min, and pt was asymptomatic upon leaving clinic.   OBJECTIVE IMPAIRMENTS: decreased activity tolerance, decreased balance, decreased mobility, difficulty walking, decreased ROM, decreased strength, decreased safety awareness, increased edema, increased muscle spasms, impaired flexibility, improper body mechanics, and pain.   ACTIVITY LIMITATIONS: bending, standing, squatting, stairs, transfers, hygiene/grooming, and locomotion level  PARTICIPATION LIMITATIONS: meal prep, cleaning, laundry, driving,  shopping, and community activity  PERSONAL FACTORS: Time since onset of injury/illness/exacerbation are also affecting patient's functional outcome.   REHAB POTENTIAL: Good  CLINICAL DECISION MAKING: Evolving/moderate complexity  EVALUATION COMPLEXITY: Moderate   GOALS: Goals reviewed with patient? Yes  SHORT TERM GOALS: Target date: 06/03/2023  Pt to be independent with initial HEP  Goal status: INITIAL   LONG TERM GOALS: Target date: 07/15/2023   Pt to be independent with final HEP  Goal status: INITIAL  2.  Pt to demo improved pain in low back to 0-4/10 with activity and IADLs.   Goal status: INITIAL  3.  Pt to report decreased pain in neck to 0-3/10 with activity and ADLs.   Goal status: INITIAL  4.  Pt to demo improved strength of hips and knees to be at least 4+/5, to improve pain and mobility .   Goal status: INITIAL  5.  Pt to demo ROM for lumbar spine to be [facility] with minimal pain, to improve ability for ADLs and IADLS.   Goal status: INITIAL  6. Pt to demo ability and optimal mechanics for bend, lift, for IADLs, to improve back pain with housework.    PLAN:  PT FREQUENCY: 1-2x/week  PT DURATION: 8 weeks  PLANNED INTERVENTIONS: Therapeutic exercises, Therapeutic activity, Neuromuscular re-education, Patient/Family education, Self Care, Joint mobilization, Joint manipulation, Stair training, Orthotic/Fit training, DME instructions, Aquatic Therapy, Dry Needling, Electrical stimulation, Cryotherapy, Moist heat, Taping, Ultrasound, Ionotophoresis 4mg /ml Dexamethasone, Manual therapy,  Vasopneumatic device, Traction, Spinal manipulation, Spinal mobilization,Balance training, Gait training,   PLAN FOR NEXT SESSION:   Sedalia Muta, PT, DPT 12:38 PM  05/27/23

## 2023-06-03 ENCOUNTER — Ambulatory Visit
Admission: RE | Admit: 2023-06-03 | Discharge: 2023-06-03 | Disposition: A | Discharge status: Home / Self Care | Payer: Medicare Other | Source: Ambulatory Visit | Attending: Family Medicine | Admitting: Family Medicine

## 2023-06-03 DIAGNOSIS — Z1231 Encounter for screening mammogram for malignant neoplasm of breast: Secondary | ICD-10-CM | POA: Diagnosis not present

## 2023-06-10 ENCOUNTER — Ambulatory Visit (INDEPENDENT_AMBULATORY_CARE_PROVIDER_SITE_OTHER): Payer: Medicare Other | Admitting: Internal Medicine

## 2023-06-10 ENCOUNTER — Encounter: Payer: Self-pay | Admitting: Internal Medicine

## 2023-06-10 ENCOUNTER — Ambulatory Visit: Payer: Medicare Other

## 2023-06-10 ENCOUNTER — Encounter: Payer: Self-pay | Admitting: Primary Care

## 2023-06-10 ENCOUNTER — Ambulatory Visit (INDEPENDENT_AMBULATORY_CARE_PROVIDER_SITE_OTHER): Payer: Medicare Other | Admitting: Primary Care

## 2023-06-10 VITALS — BP 122/80 | HR 76 | Ht 63.0 in | Wt 222.0 lb

## 2023-06-10 VITALS — BP 130/80 | HR 96 | Ht 63.0 in | Wt 222.0 lb

## 2023-06-10 DIAGNOSIS — R0609 Other forms of dyspnea: Secondary | ICD-10-CM | POA: Diagnosis not present

## 2023-06-10 DIAGNOSIS — G4730 Sleep apnea, unspecified: Secondary | ICD-10-CM

## 2023-06-10 DIAGNOSIS — R0602 Shortness of breath: Secondary | ICD-10-CM | POA: Diagnosis not present

## 2023-06-10 DIAGNOSIS — K449 Diaphragmatic hernia without obstruction or gangrene: Secondary | ICD-10-CM

## 2023-06-10 DIAGNOSIS — R109 Unspecified abdominal pain: Secondary | ICD-10-CM | POA: Diagnosis not present

## 2023-06-10 DIAGNOSIS — G4733 Obstructive sleep apnea (adult) (pediatric): Secondary | ICD-10-CM | POA: Diagnosis not present

## 2023-06-10 DIAGNOSIS — K219 Gastro-esophageal reflux disease without esophagitis: Secondary | ICD-10-CM | POA: Diagnosis not present

## 2023-06-10 NOTE — Progress Notes (Signed)
HISTORY OF PRESENT ILLNESS:  Shelly Johnson is a 77 y.o. female with multiple medical problems as listed below.  GI problems include GERD and a large hiatal hernia with [name] erosions.  She presents today with a myriad of complaints, mostly non-GI.  She was last seen in this office May 25, 2018 regarding a number of GI complaints and GERD.  She had dictation.  A number of recommendations were made, which she declined.  She did however undergo abdominal ultrasound which was unremarkable.  She has not been seen since.  Patient tells me that she developed COVID in early August with associated pneumonia.  Since that time she describes significant dyspnea on exertion.  She is due to see pulmonary this afternoon.  She also talks about having metallic taste since that time.  Problems with sneezing.  Belching.  Issues with lower extremity edema.  Chronic numbness in the lower extremities.  Unsteadiness holding items in her hands.  For all of these issues, I have referred her back to her PCP or pulmonary.  Currently GI complaint is a sensation that her abdomen is "hard as a brick".  She finds this uncomfortable.  She describes it is painful at times.  She takes omeprazole daily for her reflux.  This seems to control symptoms.  Recent CT of the chest confirmed known large hiatal hernia.  She did undergo Cologuard testing in 2020 and again most recently September 2024.  These were negative.  Her last upper endoscopy with Dr. Loreta Ave was performed in 2012.  She was noted to have a large hiatal hernia and Cameron erosions.  REVIEW OF SYSTEMS:  All non-GI ROS negative unless otherwise stated in the HPI, which were multiple   Past Medical History:  Diagnosis Date   Anemia    Anxiety and depression    during loss of husband   Arthritis    Diverticulosis    GERD (gastroesophageal reflux disease)    GI bleed    hx of; hgb as low as 5.9   Hemorrhoids    Hiatal hernia    HLD (hyperlipidemia)    Obesity     Osteoarthritis    Pneumonia    Urinary tract bacterial infections     Past Surgical History:  Procedure Laterality Date   ABDOMINAL HYSTERECTOMY     states had a "few cancer cells" but they "got everything". ? ovaries and cervix.    APPENDECTOMY     HIATAL HERNIA REPAIR      Social History Hart Wrisley  reports that she has never smoked. She has never used smokeless tobacco. She reports current alcohol use. She reports that she does not use drugs.  family history includes Arthritis in her mother; Breast cancer in her maternal grandmother; CAD in her brother; Colon cancer (age of onset: 15) in her maternal aunt; Diabetes in her brother and brother; Hypertension in her brother; Lung cancer in her brother; Uterine cancer in her mother.  Allergies  Allergen Reactions   Nsaids     Should avoid-- GI bleed   Hydrocodone Nausea And Vomiting       PHYSICAL EXAMINATION: Vital signs: BP 122/80   Pulse 76   Ht 5\' 3"  (1.6 m)   Wt 222 lb (100.7 kg)   BMI 39.33 kg/m   Constitutional: Pleasant but unhealthy appearing, no acute distress Psychiatric: alert and oriented x3, cooperative Eyes: extraocular movements intact, anicteric, conjunctiva pink Mouth: oral pharynx moist, no lesions.  No thrush Neck: supple no  lymphadenopathy Cardiovascular: heart regular rate and rhythm, no murmur Lungs: clear to auscultation bilaterally with slight decreased breath sounds at the bases Abdomen: soft, obese, nontender, nondistended, no obvious ascites, no peritoneal signs, normal bowel sounds, no organomegaly Rectal: Omitted Extremities: no clubbing or cyanosis.  Trace to 1+ lower extremity edema bilaterally, more noticeable on the right Skin: no lesions on visible extremities Neuro: No focal deficits.  Cranial nerves intact  ASSESSMENT:  1.  Persistent abdominal discomfort with fullness.  May be related to obesity.  She does complain of increased edema.  Rule out ascites or other process. 2.   GERD with large hiatal hernia and Cameron erosions.  On PPI 3.  Negative recent Cologuard testing 4.  Recent COVID infection which I think explains a number of her more recent complaints (non-GI) 5.  Multiple chronic complaints, non-GI.   PLAN:  1.  Reflux precautions 2.  Weight loss 3.  Continue PPI 4.  Schedule contrast-enhanced CT scan of the abdomen pelvis to evaluate persistent abdominal pain 5.  Keep follow-up with pulmonary regarding dyspnea on exertion 6.  See PCP regarding multiple non-GI complaints 7.  Further recommendations after the above.  If CT negative, no further GI plans at this time A total time of 45 minutes was spent preparing to see the patient, reviewing emerita studies and data, obtaining comprehensive history, performing medically appropriate physical examination, counseling and educating the patient regarding the above listed issues, answering multiple questions, ordering advanced radiology study, and documenting clinical information in the health record

## 2023-06-10 NOTE — Progress Notes (Signed)
@Patient  ID: Shelly Johnson, female    DOB: Oct 11, 1945, 77 y.o.   MRN: [id number]  Chief Complaint  Patient presents with   Follow-up    Referring provider: Shelva Majestic, MD  HPI: 77 year old female, never smoked.  Past medical history significant for hypertension, hiatal hernia, hyperlipidemia, hyperglycemia, vitamin D deficiency,  anemia, morbid obesity.  Previous [facility] pulmonary encounter: 12/09/2022 Patient presents today for sleep consult. She has symptoms of daytime sleepiness/fatigue. Needs to rest her eyes or take a nap some days. She does think that she snores, she can hear herself when she doses off at times. She goes to bed 11pm-12:30am. She does not always go to bed at the same time. It does not take her long to fall asleep. She gets on average 5-8 hours of sleep a night. She wakes up 0-3 times a night to use the restroom. No concern for narcolepsy, cataplexy or sleep walking.   She experiences shortness of breath walking up stairs or walking to the mailbox. She has been under a lot of stress recently. Weight can fluctuate 5 lbs. She was walking every other day up to a mile until recently due to neuropathy. Denies chest tightness or wheezing. No hx childhood asthma. Never smoked. She had CXR in August 2022 showed large hiatal hernia. She has seen GI in the past for dysphagia, taking reflux medication daily and symptoms improved. Echocardiogram in 2018 showed grade 1 diastolic dysfunction. No leg swelling.   Sleep questionnaire Symptoms- snoring, daytime fatigue  Prior sleep study- none  Bedtime- 11pm-12:30am  Time to fall asleep- not long  Nocturnal awakenings- 0-3 times Out of bed/start of day- 6-6:30am to 7-7:30am  Weight changes- not much  Do you operate heavy machinery- no Do you currently wear CPAP- no Do you current wear oxygen- no Epworth- 3    04/15/2023 Patient presents today to review sleep study results.  Seen for sleep consult in April due to snoring  symptoms and daytime fatigue. Epworth score 3. Home sleep study on 03/07/2023 showed severe obstructive sleep apnea, AHI 34.6/h with SpO2 low 71% (average 87%).  We reviewed sleep study results today.  Recommending patient be started on CPAP due to severity of her OSA and hypoxemia.  Patient somewhat reluctant and initially wanted to try oral appliance, however, after encouraging she consider CPAP she is open to trying.  Her husband had CPAP and did not tolerate well, she tells me that the machine was large and bulky.  She was more agreeing to CPAP trial after showing her several different mask types and size of CPAP machine.    06/10/2023 Patient presents today for 8 week follow-up OSA. She has been 100% compliant with CPAP use over the last 30 days greater than 4 hours.  Average usage 7 hours 32 minutes.  Current pressure 5 to 15 cm H2O.  She is still getting used to wearing CPAP mask and is not particularly fond of wearing it. She has no trouble falling or staying asleep.  She reports worsening shortness of breath.  She was noted to have a large hiatal hernia on CTA imaging back in August 2024.  She did have a few scattered tree-in-bud nodularity opacities right lower lobe likely infectious for inflammatory.  She saw GI yesterday and was ordered for CT abdomen.  Shortness of breath is limiting.  She has no associated URI symptoms.  No cough, chest tightness or wheezing.  Oxygen level normal.  Airview download 05/11/2023 - 06/09/2023 Average usage  30/30 days (100%) Average usage 7 hours 32 minutes Pressure 5 to 15 cm H2O (13.9 L/min - 95%) Air leaks 20.9 L/min (95%) AHI 2.8   Allergies  Allergen Reactions   Nsaids     Should avoid-- GI bleed   Hydrocodone Nausea And Vomiting    Immunization History  Administered Date(s) Administered   Fluad Quad(high Dose 65+) 06/04/2020, 05/30/2021, 06/25/2022   Influenza, High Dose Seasonal PF 07/04/2016, 06/19/2017, 04/28/2018   Influenza,inj,Quad PF,6+  Mos 07/18/2013, 04/25/2014   Janssen (J&J) SARS-COV-2 Vaccination 02/09/2020   Pneumococcal Conjugate-13 07/18/2013, 04/03/2015   Pneumococcal Polysaccharide-23 07/04/2016   Tdap 07/21/2013    Past Medical History:  Diagnosis Date   Anemia    Anxiety and depression    during loss of husband   Arthritis    Diverticulosis    GERD (gastroesophageal reflux disease)    GI bleed    hx of; hgb as low as 5.9   Hemorrhoids    Hiatal hernia    HLD (hyperlipidemia)    Obesity    Osteoarthritis    Pneumonia    Urinary tract bacterial infections     Tobacco History: Social History   Tobacco Use  Smoking Status Never  Smokeless Tobacco Never   Counseling given: Not Answered   Outpatient Medications Prior to Visit  Medication Sig Dispense Refill   albuterol (VENTOLIN HFA) 108 (90 Base) MCG/ACT inhaler Inhale 2 puffs into the lungs every 6 (six) hours as needed for wheezing or shortness of breath. Use 30 minutes prior to activity. 8 g 2   chlorthalidone (HYGROTON) 25 MG tablet TAKE 1 TABLET BY MOUTH DAILY 90 tablet 2   ferrous sulfate 325 (65 FE) MG tablet Take 1 tablet (325 mg total) by mouth 2 (two) times daily. (Patient taking differently: Take 325 mg by mouth daily.) 60 tablet 3   naproxen sodium (ALEVE) 220 MG tablet Take 220 mg by mouth. As needed     omeprazole (PRILOSEC) 40 MG capsule TAKE ONE CAPSULE BY MOUTH DAILY 90 capsule 2   vitamin C (ASCORBIC ACID) 500 MG tablet Take 500 mg by mouth daily.       VITAMIN D PO Take 1 tablet by mouth daily.     No facility-administered medications prior to visit.   Review of Systems  Review of Systems  Constitutional: Negative.   HENT: Negative.    Respiratory:  Positive for shortness of breath. Negative for cough and wheezing.   Cardiovascular: Negative.    Physical Exam  BP 130/80 (BP Location: Left Arm, Cuff Size: Large)   Pulse 96   Ht 5\' 3"  (1.6 m)   Wt 222 lb (100.7 kg)   SpO2 95%   BMI 39.33 kg/m  Physical  Exam Constitutional:      General: She is not in acute distress.    Appearance: Normal appearance. She is not ill-appearing.  HENT:     Head: Normocephalic and atraumatic.     Mouth/Throat:     Mouth: Mucous membranes are moist.     Pharynx: Oropharynx is clear.  Cardiovascular:     Rate and Rhythm: Normal rate and regular rhythm.  Pulmonary:     Effort: Pulmonary effort is normal.     Breath sounds: Normal breath sounds. No wheezing or rhonchi.     Comments: Dyspnea with exertion/speaking  Skin:    General: Skin is warm and dry.  Neurological:     General: No focal deficit present.     Mental Status:  She is alert and oriented to person, place, and time. Mental status is at baseline.  Psychiatric:        Mood and Affect: Mood normal.        Behavior: Behavior normal.        Thought Content: Thought content normal.        Judgment: Judgment normal.      Lab Results:  CBC    Component Value Date/Time   WBC 7.3 03/19/2023 1655   RBC 4.97 03/19/2023 1655   HGB 15.4 (H) 03/19/2023 1655   HCT 45.3 03/19/2023 1655   PLT 178 03/19/2023 1655   MCV 91.1 03/19/2023 1655   MCH 31.0 03/19/2023 1655   MCHC 34.0 03/19/2023 1655   RDW 13.1 03/19/2023 1655   LYMPHSABS 1.3 03/19/2023 1655   MONOABS 0.5 03/19/2023 1655   EOSABS 0.0 03/19/2023 1655   BASOSABS 0.0 03/19/2023 1655    BMET    Component Value Date/Time   NA 139 03/19/2023 1655   K 3.3 (L) 03/19/2023 1655   CL 95 (L) 03/19/2023 1655   CO2 34 (H) 03/19/2023 1655   GLUCOSE 102 (H) 03/19/2023 1655   BUN 11 03/19/2023 1655   CREATININE 0.78 03/19/2023 1655   CREATININE 0.86 06/04/2020 1538   CALCIUM 9.3 03/19/2023 1655   GFRNONAA >60 03/19/2023 1655   GFRNONAA 67 06/04/2020 1538   GFRAA 78 06/04/2020 1538    BNP    Component Value Date/Time   BNP 45.4 03/19/2023 1655    ProBNP No results found for: "PROBNP"  Imaging: MM 3D SCREENING MAMMOGRAM BILATERAL BREAST  Result Date: 06/05/2023 CLINICAL DATA:   Screening. EXAM: DIGITAL SCREENING BILATERAL MAMMOGRAM WITH TOMOSYNTHESIS AND CAD TECHNIQUE: Bilateral screening digital craniocaudal and mediolateral oblique mammograms were obtained. Bilateral screening digital breast tomosynthesis was performed. The images were evaluated with computer-aided detection. COMPARISON:  Previous exam(s). ACR Breast Density Category b: There are scattered areas of fibroglandular density. FINDINGS: There are no findings suspicious for malignancy. IMPRESSION: No mammographic evidence of malignancy. A result letter of this screening mammogram will be mailed directly to the patient. RECOMMENDATION: Screening mammogram in one year. (Code:SM-B-01Y) BI-RADS CATEGORY  1: Negative. Electronically Signed   By: Harmon Pier M.D.   On: 06/05/2023 11:01     Assessment & Plan:   Severe sleep apnea - Patient is 100% compliant with PAP use greater than 4 hours over the last 30 days. Average usage 7 hours 30 minutes.  Current pressure 5 to 15 cm H2O; residual AHI 2.8/h. Recommend adjusting pressure 8-18cm h20. Continue to encourage patient wear CPAP nightly 4 to 6 hours or longer.  Dyspnea on exertion - Patient reports worsening shortness of breath without associated URI symptoms.  She was noted to have a large hiatal hernia on CTA imaging back in August 2024 along with scattered tree-in-bud nodularity opacities right lower lobe likely infectious or inflammatory. GI has ordered CT of her abdomen. Needs fu CT of her chest at the same time due to progressive dyspnea.  Recommendations: - Continue to wear CPAP nightly 4-6 hours  - Try using a fan at night while sleeping - Look into getting CPAP liners for mask or head gear cover - Increase Omeprazole twice daily (take 30-60mins before eating)  - Recommend getting CT chest along with abdomen to re-assess  - Follow-up with GI regarding large hiatal hernia, likely sole cause of your shortness of breath   Orders: - Adjust CPAP pressure 8 to  18 cm H2O  Follow-up: - 6 months with Beth NP    Glenford Bayley, NP 06/22/2023

## 2023-06-10 NOTE — Patient Instructions (Addendum)
Recommendations: - Continue to wear CPAP nightly 4-6 hours  - Try using a fan at night while sleeping - Look into getting CPAP liners for mask or head gear cover - Increase Omeprazole twice daily (take 30-32mins before eating)  - Recommend getting CT chest along with abdomen to re-assess  - Follow-up with GI regarding large hiatal hernia, likely sole cause of your shortness of breath   Orders: - Adjust CPAP pressure 8 to 18 cm H2O  Follow-up: - 6 months with Waynetta Sandy NP

## 2023-06-10 NOTE — Patient Instructions (Signed)
You will be contacted by [facility] Scheduling in the next 2 days to arrange a CT  The number on your caller ID will be [phone], please answer when they call.  If you have not heard from them in 2 days please call [phone] to schedule.     _______________________________________________________  If your blood pressure at your visit was 140/90 or greater, please contact your primary care physician to follow up on this.  _______________________________________________________  If you are age 75 or older, your body mass index should be between 23-30. Your Body mass index is 39.33 kg/m. If this is out of the aforementioned range listed, please consider follow up with your Primary Care Provider.  If you are age 70 or younger, your body mass index should be between 19-25. Your Body mass index is 39.33 kg/m. If this is out of the aformentioned range listed, please consider follow up with your Primary Care Provider.   ________________________________________________________  The [facility] providers would like to encourage you to use [facility] to communicate with providers for non-urgent requests or questions.  Due to long hold times on the telephone, sending your provider a message by [facility] may be a faster and more efficient way to get a response.  Please allow 48 business hours for a response.  Please remember that this is for non-urgent requests.  _______________________________________________________

## 2023-06-11 ENCOUNTER — Ambulatory Visit (INDEPENDENT_AMBULATORY_CARE_PROVIDER_SITE_OTHER): Payer: Medicare Other | Admitting: Physical Therapy

## 2023-06-11 DIAGNOSIS — M542 Cervicalgia: Secondary | ICD-10-CM

## 2023-06-11 DIAGNOSIS — M6281 Muscle weakness (generalized): Secondary | ICD-10-CM

## 2023-06-11 DIAGNOSIS — M5459 Other low back pain: Secondary | ICD-10-CM

## 2023-06-11 NOTE — Therapy (Signed)
OUTPATIENT PHYSICAL THERAPY LOWER EXTREMITY Treatment    Patient Name: Shelly Johnson MRN: [id number] DOB:12-22-1945, 77 y.o., female Today's Date: 06/11/2023  END OF SESSION:  PT End of Session - 06/14/23 2054     Visit Number 3    Number of Visits 16    Date for PT Re-Evaluation 07/15/23    Authorization Type Medicare    PT Start Time 1434    PT Stop Time 1502    PT Time Calculation (min) 28 min    Activity Tolerance Patient tolerated treatment well    Behavior During Therapy WFL for tasks assessed/performed               Past Medical History:  Diagnosis Date   Anemia    Anxiety and depression    during loss of husband   Arthritis    Diverticulosis    GERD (gastroesophageal reflux disease)    GI bleed    hx of; hgb as low as 5.9   Hemorrhoids    Hiatal hernia    HLD (hyperlipidemia)    Obesity    Osteoarthritis    Pneumonia    Urinary tract bacterial infections    Past Surgical History:  Procedure Laterality Date   ABDOMINAL HYSTERECTOMY     states had a "few cancer cells" but they "got everything". ? ovaries and cervix.    APPENDECTOMY     HIATAL HERNIA REPAIR     Patient Active Problem List   Diagnosis Date Noted   Fatty liver 04/27/2023   Severe sleep apnea 04/15/2023   Pneumonia due to COVID-19 virus 03/19/2023   Vitamin D deficiency 09/16/2022   Hyperglycemia 01/27/2020   Hyperlipidemia, unspecified 09/14/2019   DDD (degenerative disc disease), lumbar 11/16/2018   Morbid obesity (HCC) 04/28/2018   Numbness and tingling of both feet 01/21/2018   Depression, major, single episode, moderate (HCC) 06/20/2017   Essential hypertension 08/23/2016   Dyspnea on exertion 08/23/2016   ANEMIA-NOS 03/16/2008   Hiatal hernia with gastroesophageal reflux 03/08/2008    PCP: Tana Conch  REFERRING PROVIDER: Tana Conch  REFERRING DIAG: Low back pain, neck pain   THERAPY DIAG:  Other low back pain  Cervicalgia  Muscle weakness  (generalized)  Rationale for Evaluation and Treatment: Rehabilitation  ONSET DATE:    SUBJECTIVE:   SUBJECTIVE STATEMENT: 06/11/2023   Pt has seen neurology, just say pulmonology again, and has also seen GI this week. She is going to have CT for chest and abdomen in next couple weeks. She reports she is still having shortness of breath, as well as pain in her abdomen. She has been doing standing exercises at counter, doing well with that. Having some pain in abdomen when doing supine stretches for her back.    Eval:  "I hurt from my neck to my toes" Pt states neck and back and lower leg pain.  Back hurts on both sides, all the way across, did have pain into R leg at one time, but no more pain into LE.  Constant pain in back- can't get comfortable, sit too long or stand too long causes pain.  Neck: States whole neck hurts, achey, hears it cracking. No numb/tingling into UEs.  States Bil feet numb, swollen, R has been swollen for a couple years, L is now starting. States both lower legs are very tender to touch. Has not tried compression socks, is seeing pulmonology. Just started using CPAP at night. Notes that she feels very out of breath when  walking to mail box more recently. She likes to walk, used to walk in the past, but doesn't anymore because of feet and legs. She also states significant amount of stress in the last couple years. She  Tries to stretch in back and legs, but realizes she has been less active.  Had covid at end of August with pneumonia.    PERTINENT HISTORY: HTN, sleep apnea, hysterectomy, anxiety,   PAIN:  Are you having pain? Yes: NPRS scale:  6-7 /10 Pain location: low back  Pain description: achey/constant  Aggravating factors: unable to state, hurts most of the time . Relieving factors: alieve- 0-2 per day.   Are you having pain? Yes: NPRS scale: 5-6/10 Pain location: all over/neck Pain description: achey, sore , cracks  Aggravating factors: none stated   Relieving factors: none stated    PRECAUTIONS: None  WEIGHT BEARING RESTRICTIONS: No  FALLS:  Has patient fallen in last 6 months? No   PLOF: Independent  PATIENT GOALS:  Decreased pain in back and neck.   NEXT MD VISIT:   OBJECTIVE:   DIAGNOSTIC FINDINGS:   PATIENT SURVEYS:  Foto: initial:/back:    COGNITION: Overall cognitive status: Within functional limits for tasks assessed     SENSATION: WFL  EDEMA: R>L mild/moderate in lower leg, ankle and foot.    POSTURE:   No Significant postural limitations  PALPATION:    lower legs: moderate sensitivity/tenderness to light palpation with Swelling and mild redness in R lower leg.  Neck: tender in bil cervical paraspinals and c-spine , tightness and soreness in bil UT s. Back: tender and tight in bil lumbar region bilaterally, some at T/L junction.   LOWER EXTREMITY ROM: Neck ROM: WFL, mild limitation for extension,  sore with rotation  Shoulders: WFL, limited behind the back IR.  Lumbar: Flexion/ext: mild limitations,  SB: mild/mod limitations with tightness in bil QL Knees/hips: WFL   LOWER EXTREMITY MMT:  MMT Left eval Right  eval  Hip flexion 4 4  Hip extension    Hip abduction 4 4  Hip adduction    Hip internal rotation    Hip external rotation    Knee flexion 4+ 4+  Knee extension 4+ 4+  Ankle dorsiflexion    Ankle plantarflexion    Ankle inversion    Ankle eversion     (Blank rows = not tested)   LOWER EXTREMITY SPECIAL TESTS:  Neg SLR  FUNCTIONAL TESTS:   GAIT:   TODAY'S TREATMENT:                                                                                                                              DATE:   06/11/2023  Therapeutic Exercise: Aerobic: Supine: Seated:  reviewed for HEP: LAQ x 10 bil; ankle pumps x 10 bil;  Standing:   Stretches: Seated Pelvic tilts x 10 (pain in stomach)   Neuromuscular Re-education: Manual Therapy: Therapeutic Activity: Self  Care:  PATIENT EDUCATION:  Education details:updated and reviewed HEP Person educated: Patient Education method: Explanation, Demonstration, Tactile cues, Verbal cues, and Handouts Education comprehension: verbalized understanding, returned demonstration, verbal cues required, tactile cues required, and needs further education   HOME EXERCISE PROGRAM: Access Code: [email or web address]    ASSESSMENT:  CLINICAL IMPRESSION: Pt reporting more consistent pain in abdomen. She states difficulty doing supine ther ex due to pain In this location. We attempted some seated back mobility, pelvic tilts, but still painful with any flexion of back. Her back is still painful most days. Pt already scheduled for CT for abdominal pain. Discussed holding PT at this time, due to discomfort with back mobility, and for her to continue use of heat and HEP as able. She will continue standing ther ex and walking mobility as able. Pt will return after she gets results from her testing, and will require further PT in future for her back pain when other abdominal issues are ruled out. Pt in agreement with plan. Discussed getting appt with PCP next week, if she feels she has other questions before her upcoming tests.   Eval: Patient presents with primary complaint of ongoing pain in low back, as well as neck.She has decreased ROM for lumbar spine, with increased pain. She also has decreased core and hip strength, and poor movement quality with bending. She has increased pain in neck and will benefit from postural education and training. She also has increased swelling in bil lower legs, and will benefit from eduction on ways to reduce swelling for improving sensation and balance with walking.  Pt with several deficits that will benefit from PT.  Pt with decreased ability for full functional activities. Pt will  benefit from skilled PT to improve deficits and pain and to return to PLOF. Pt with mild nausea after sitting up from laying  down position. Vitals checked, see above, stable. Pt symptoms subsided within 2-3 min, and pt was asymptomatic upon leaving clinic.   OBJECTIVE IMPAIRMENTS: decreased activity tolerance, decreased balance, decreased mobility, difficulty walking, decreased ROM, decreased strength, decreased safety awareness, increased edema, increased muscle spasms, impaired flexibility, improper body mechanics, and pain.   ACTIVITY LIMITATIONS: bending, standing, squatting, stairs, transfers, hygiene/grooming, and locomotion level  PARTICIPATION LIMITATIONS: meal prep, cleaning, laundry, driving, shopping, and community activity  PERSONAL FACTORS: Time since onset of injury/illness/exacerbation are also affecting patient's functional outcome.   REHAB POTENTIAL: Good  CLINICAL DECISION MAKING: Evolving/moderate complexity  EVALUATION COMPLEXITY: Moderate   GOALS: Goals reviewed with patient? Yes  SHORT TERM GOALS: Target date: 06/03/2023  Pt to be independent with initial HEP  Goal status: INITIAL   LONG TERM GOALS: Target date: 07/15/2023   Pt to be independent with final HEP  Goal status: INITIAL  2.  Pt to demo improved pain in low back to 0-4/10 with activity and IADLs.   Goal status: INITIAL  3.  Pt to report decreased pain in neck to 0-3/10 with activity and ADLs.   Goal status: INITIAL  4.  Pt to demo improved strength of hips and knees to be at least 4+/5, to improve pain and mobility .   Goal status: INITIAL  5.  Pt to demo ROM for lumbar spine to be [facility] with minimal pain, to improve ability for ADLs and IADLS.   Goal status: INITIAL  6. Pt to demo ability and optimal mechanics for bend, lift, for IADLs, to improve back pain with housework.    PLAN:  PT FREQUENCY: 1-2x/week  PT DURATION:  8 weeks  PLANNED INTERVENTIONS: Therapeutic exercises, Therapeutic activity, Neuromuscular re-education, Patient/Family education, Self Care, Joint mobilization, Joint manipulation,  Stair training, Orthotic/Fit training, DME instructions, Aquatic Therapy, Dry Needling, Electrical stimulation, Cryotherapy, Moist heat, Taping, Ultrasound, Ionotophoresis 4mg /ml Dexamethasone, Manual therapy,  Vasopneumatic device, Traction, Spinal manipulation, Spinal mobilization,Balance training, Gait training,   PLAN FOR NEXT SESSION:   Sedalia Muta, PT, DPT 8:55 PM  06/14/23

## 2023-06-14 ENCOUNTER — Encounter: Payer: Self-pay | Admitting: Physical Therapy

## 2023-06-22 NOTE — Assessment & Plan Note (Addendum)
-   Patient reports worsening shortness of breath without associated URI symptoms.  She was noted to have a large hiatal hernia on CTA imaging back in August 2024 along with scattered tree-in-bud nodularity opacities right lower lobe likely infectious or inflammatory. GI has ordered CT of her abdomen. Needs fu CT of her chest at the same time due to progressive dyspnea.

## 2023-06-22 NOTE — Assessment & Plan Note (Addendum)
-   Patient is 100% compliant with PAP use greater than 4 hours over the last 30 days. Average usage 7 hours 30 minutes.  Current pressure 5 to 15 cm H2O; residual AHI 2.8/h. Recommend adjusting pressure 8-18cm h20. Continue to encourage patient wear CPAP nightly 4 to 6 hours or longer.

## 2023-06-24 ENCOUNTER — Encounter: Payer: Medicare Other | Admitting: Physical Therapy

## 2023-06-25 ENCOUNTER — Other Ambulatory Visit: Payer: Self-pay | Admitting: Internal Medicine

## 2023-06-25 ENCOUNTER — Ambulatory Visit (HOSPITAL_COMMUNITY)
Admission: RE | Admit: 2023-06-25 | Discharge: 2023-06-25 | Disposition: A | Discharge status: Home / Self Care | Payer: Medicare Other | Source: Ambulatory Visit | Attending: Internal Medicine | Admitting: Internal Medicine

## 2023-06-25 ENCOUNTER — Telehealth: Payer: Self-pay

## 2023-06-25 DIAGNOSIS — K449 Diaphragmatic hernia without obstruction or gangrene: Secondary | ICD-10-CM | POA: Diagnosis not present

## 2023-06-25 DIAGNOSIS — R0602 Shortness of breath: Secondary | ICD-10-CM

## 2023-06-25 DIAGNOSIS — R109 Unspecified abdominal pain: Secondary | ICD-10-CM

## 2023-06-25 DIAGNOSIS — K219 Gastro-esophageal reflux disease without esophagitis: Secondary | ICD-10-CM

## 2023-06-25 DIAGNOSIS — J9811 Atelectasis: Secondary | ICD-10-CM | POA: Diagnosis not present

## 2023-06-25 DIAGNOSIS — R918 Other nonspecific abnormal finding of lung field: Secondary | ICD-10-CM | POA: Diagnosis not present

## 2023-06-25 DIAGNOSIS — K5730 Diverticulosis of large intestine without perforation or abscess without bleeding: Secondary | ICD-10-CM | POA: Diagnosis not present

## 2023-06-25 DIAGNOSIS — J929 Pleural plaque without asbestos: Secondary | ICD-10-CM | POA: Diagnosis not present

## 2023-06-25 DIAGNOSIS — R1030 Lower abdominal pain, unspecified: Secondary | ICD-10-CM | POA: Diagnosis not present

## 2023-06-25 MED ORDER — IOHEXOL 300 MG/ML  SOLN: 100.0000 mL | Freq: Once | INTRAMUSCULAR | Status: DC | PRN

## 2023-06-25 NOTE — Telephone Encounter (Signed)
Received call from CT dept and they are wanting to know if they can do the ct without contrast. They have not been able to get IV accessX2 and pt is about to have a panic attack and hyperventilating. Cell phone number for Dr. [name] given to CT dept. To see if they can change the order to without contrast.

## 2023-08-07 ENCOUNTER — Telehealth: Payer: Self-pay | Admitting: Family Medicine

## 2023-08-07 NOTE — Telephone Encounter (Signed)
Copied from CRM [id number]. Topic: Referral - Request for Referral >> Aug 07, 2023  3:56 PM [name] wrote: Did the patient discuss referral with their provider in the last year? No (If No - schedule appointment) (If Yes - send message)  Appointment offered? No  Type of order/referral and detailed reason for visit: Pt called in stated she was previous referred to heart specialist (2-3 years ago) and thinks she may need to be seen again by this specialist. She believes the name was Dr. Excell Seltzer. She wanted to know if she would need another referral? She states she has been to many specialist and has had many tests and no one is able to give a resolution for her. Her breathing is getting heavier and she has lack of energy and legs and feet still hurt to walk. She would like to request referral for heart specialist if possible.   Preference of office, provider, location: Dr. Excell Seltzer  If referral order, have you been seen by this specialty before? Yes (If Yes, this issue or another issue? When? Where?  Can we respond through MyChart? No

## 2023-08-10 ENCOUNTER — Other Ambulatory Visit: Payer: Self-pay

## 2023-08-10 DIAGNOSIS — E785 Hyperlipidemia, unspecified: Secondary | ICD-10-CM

## 2023-08-10 NOTE — Telephone Encounter (Signed)
Referral to Dr. Excell Seltzer has been placed.

## 2023-08-21 ENCOUNTER — Other Ambulatory Visit: Payer: Self-pay | Admitting: Family Medicine

## 2023-11-24 ENCOUNTER — Other Ambulatory Visit (HOSPITAL_COMMUNITY): Payer: Self-pay

## 2023-11-30 ENCOUNTER — Other Ambulatory Visit: Payer: Self-pay | Admitting: Family Medicine

## 2023-12-10 ENCOUNTER — Encounter: Payer: Self-pay | Admitting: Cardiovascular Disease

## 2023-12-10 ENCOUNTER — Ambulatory Visit: Payer: Medicare Other | Attending: Cardiovascular Disease | Admitting: Cardiovascular Disease

## 2023-12-10 VITALS — BP 126/76 | HR 90 | Ht 62.0 in | Wt 222.4 lb

## 2023-12-10 DIAGNOSIS — R0602 Shortness of breath: Secondary | ICD-10-CM | POA: Diagnosis not present

## 2023-12-10 NOTE — Progress Notes (Signed)
 Cardiology Office Note:    Date:  12/10/2023   ID:  Shelly Johnson, Shelly Johnson [birth year], MRN [id number]  PCP:  Almira Jaeger, MD   [facility] Providers Cardiologist:  None     Referring MD: Almira Jaeger, MD   Chief Complaint  Patient presents with   Shortness of Breath    History of Present Illness:    Shelly Johnson is a 78 y.o. female presenting for evaluation of shortness of breath.  I saw the patient back in 2018 for shortness of breath that we felt was likely related to obesity and deconditioning.  At that time, she underwent a Myoview  stress test and an echocardiogram, both of which were unrevealing.  She presents today for evaluation of shortness of breath.  Her symptoms been progressive.  She has been seen by pulmonary medicine and underwent a chest CT.  This showed a large hiatal hernia containing the majority of the stomach with compressive atelectasis in the left lung related to the hiatal hernia.  She denies orthopnea, PND, or chest pressure with exertion.  States she has problems with her balance.  She has problems with fatigue.  She has chronic leg edema that is unchanged.   Current Medications: Current Meds  Medication Sig   albuterol  (VENTOLIN  HFA) 108 (90 Base) MCG/ACT inhaler Inhale 2 puffs into the lungs every 6 (six) hours as needed for wheezing or shortness of breath. Use 30 minutes prior to activity.   chlorthalidone  (HYGROTON ) 25 MG tablet TAKE 1 TABLET BY MOUTH DAILY   ferrous sulfate  325 (65 FE) MG tablet Take 1 tablet (325 mg total) by mouth 2 (two) times daily. (Patient taking differently: Take 325 mg by mouth daily.)   naproxen sodium (ALEVE) 220 MG tablet Take 220 mg by mouth. As needed   omeprazole  (PRILOSEC) 40 MG capsule TAKE 1 CAPSULE BY MOUTH DAILY   vitamin C (ASCORBIC ACID) 500 MG tablet Take 500 mg by mouth daily.     VITAMIN D  PO Take 1 tablet by mouth daily.     Allergies:   Nsaids and Hydrocodone   ROS:   Please see  the history of present illness.    All other systems reviewed and are negative.  EKGs/Labs/Other Studies Reviewed:    The following studies were reviewed today: Cardiac Studies & Procedures   ______________________________________________________________________________________________   STRESS TESTS  MYOCARDIAL PERFUSION IMAGING 12/18/2016  Narrative  Nuclear stress EF: 55%.  There was no ST segment deviation noted during stress.  Normal perfusion  This is a low risk study.   ECHOCARDIOGRAM  ECHOCARDIOGRAM COMPLETE 09/02/2016  Narrative *[facility]* [address] [place], [place] [address] [phone]  ------------------------------------------------------------------- Transthoracic Echocardiography  Patient:    Shelly Johnson, Shelly Johnson MR #:       [id number] Study Date: 09/02/2016 Gender:     F Age:        71 Height:     156.2 cm Weight:     98.6 kg BSA:        2.12 m^2 Pt. Status: Room:  SONOGRAPHER  Velda Gerlach ATTENDING    Dorothye Gathers, M.D. ORDERING     Almira Jaeger REFERRING    Almira Jaeger PERFORMING   [facility] Out[facility]  cc:  ------------------------------------------------------------------- LV EF: 60% -   65%  ------------------------------------------------------------------- Indications:      (R06.09).  ------------------------------------------------------------------- History:   PMH:  Acquired from the patient and from the patient&'s chart.  Dyspnea.  Risk  factors:  Hypertension.  ------------------------------------------------------------------- Study Conclusions  - Left ventricle: The cavity size was normal. Wall thickness was normal. Systolic function was normal. The estimated ejection fraction was in the range of 60% to 65%. Wall motion was normal; there were no regional wall motion abnormalities. Doppler parameters are consistent with abnormal left ventricular relaxation (grade 1 diastolic dysfunction). - Mitral  valve: There was trivial regurgitation. - Tricuspid valve: There was trivial regurgitation. - Pulmonary arteries: Systolic pressure was mildly increased. PA peak pressure: 31 mm Hg (S).  ------------------------------------------------------------------- Study data:  No prior study was available for comparison.  Study status:  Routine.  Procedure:  The patient reported no pain pre or post test. Transthoracic echocardiography for left ventricular function evaluation. Image quality was adequate.  Study completion: There were no complications.          Transthoracic echocardiography.  M-mode, complete 2D, spectral Doppler, and color Doppler.  Birthdate:  Patient birthdate: Aug 28, 1945.  Age:  Patient is 78 yr old.  Sex:  Gender: female.    BMI: 40.4 kg/m^2.  Blood pressure:     144/88  Patient status:  Outpatient.  Study date: Study date: 09/02/2016. Study time: 04:19 PM.  Location:  [facility]  -------------------------------------------------------------------  ------------------------------------------------------------------- Left ventricle:  The cavity size was normal. Wall thickness was normal. Systolic function was normal. The estimated ejection fraction was in the range of 60% to 65%. Wall motion was normal; there were no regional wall motion abnormalities. Doppler parameters are consistent with abnormal left ventricular relaxation (grade 1 diastolic dysfunction).  ------------------------------------------------------------------- Aortic valve:   Trileaflet; normal thickness leaflets. Mobility was not restricted.  Doppler:  Transvalvular velocity was within the normal range. There was no stenosis. There was no regurgitation.  ------------------------------------------------------------------- Aorta:  Aortic root: The aortic root was normal in size.  ------------------------------------------------------------------- Mitral valve:   Structurally normal valve.    Mobility was not restricted.  Doppler:  Transvalvular velocity was within the normal range. There was no evidence for stenosis. There was trivial regurgitation.    Peak gradient (D): 3 mm Hg.  ------------------------------------------------------------------- Left atrium:  The atrium was normal in size.  ------------------------------------------------------------------- Right ventricle:  The cavity size was normal. Wall thickness was normal. Systolic function was normal.  ------------------------------------------------------------------- Pulmonic valve:    Structurally normal valve.   Cusp separation was normal.  Doppler:  Transvalvular velocity was within the normal range. There was no evidence for stenosis. There was no regurgitation.  ------------------------------------------------------------------- Tricuspid valve:   Structurally normal valve.    Doppler: Transvalvular velocity was within the normal range. There was trivial regurgitation.  ------------------------------------------------------------------- Pulmonary artery:   The main pulmonary artery was normal-sized. Systolic pressure was mildly increased.  ------------------------------------------------------------------- Right atrium:  The atrium was normal in size.  ------------------------------------------------------------------- Pericardium:  There was no pericardial effusion.  ------------------------------------------------------------------- Systemic veins: Inferior vena cava: The vessel was normal in size.  ------------------------------------------------------------------- Measurements  Left ventricle                           Value        Reference LV ID, ED, PLAX chordal          (L)     41.5  mm     43 - 52 LV ID, ES, PLAX chordal                  25    mm     23 -  38 LV fx shortening, PLAX chordal           40    %      >=29 LV PW thickness, ED                      9.68  mm     --------- IVS/LV  PW ratio, ED                      1.15         <=1.3 Stroke volume, 2D                        53    ml     --------- Stroke volume/bsa, 2D                    25    ml/m^2 --------- LV ejection fraction, 1-p A4C            56    %      --------- LV end-diastolic volume, 2-p             58    ml     --------- LV end-systolic volume, 2-p              24    ml     --------- LV ejection fraction, 2-p                59    %      --------- Stroke volume, 2-p                       34    ml     --------- LV end-diastolic volume/bsa, 2-p         27    ml/m^2 --------- LV end-systolic volume/bsa, 2-p          11    ml/m^2 --------- Stroke volume/bsa, 2-p                   16    ml/m^2 --------- LV e&', lateral                           6.14  cm/s   --------- LV E/e&', lateral                         13.83        --------- LV e&', medial                            5.36  cm/s   --------- LV E/e&', medial                          15.84        --------- LV e&', average                           5.75  cm/s   --------- LV E/e&', average                         14.77        ---------  Ventricular septum  Value        Reference IVS thickness, ED                        11.1  mm     ---------  LVOT                                     Value        Reference LVOT ID, S                               18    mm     --------- LVOT area                                2.54  cm^2   --------- LVOT ID                                  18    mm     --------- LVOT peak velocity, S                    111   cm/s   --------- LVOT mean velocity, S                    66.4  cm/s   --------- LVOT VTI, S                              20.8  cm     --------- LVOT peak gradient, S                    5     mm Hg  --------- Stroke volume (SV), LVOT DP              52.9  ml     --------- Stroke index (SV/bsa), LVOT DP           24.9  ml/m^2 ---------  Aorta                                    Value         Reference Aortic root ID, ED                       29    mm     --------- Ascending aorta ID, A-P, S               30    mm     ---------  Left atrium                              Value        Reference LA ID, A-P, ES                           30    mm     --------- LA ID/bsa, A-P  1.41  cm/m^2 <=2.2 LA volume, S                             41    ml     --------- LA volume/bsa, S                         19.3  ml/m^2 --------- LA volume, ES, 1-p A4C                   31    ml     --------- LA volume/bsa, ES, 1-p A4C               14.6  ml/m^2 --------- LA volume, ES, 1-p A2C                   55    ml     --------- LA volume/bsa, ES, 1-p A2C               25.9  ml/m^2 ---------  Mitral valve                             Value        Reference Mitral E-wave peak velocity              84.9  cm/s   --------- Mitral A-wave peak velocity              99.7  cm/s   --------- Mitral deceleration time         (H)     299   ms     150 - 230 Mitral peak gradient, D                  3     mm Hg  --------- Mitral E/A ratio, peak                   0.9          ---------  Pulmonary arteries                       Value        Reference PA pressure, S, DP               (H)     31    mm Hg  <=30  Tricuspid valve                          Value        Reference Tricuspid regurg peak velocity           263   cm/s   --------- Tricuspid peak RV-RA gradient            28    mm Hg  ---------  Systemic veins                           Value        Reference Estimated CVP                            3     mm Hg  ---------  Right ventricle  Value        Reference RV pressure, S, DP               (H)     31    mm Hg  <=30 RV s&', lateral, S                        20.4  cm/s   ---------  Legend: (L)  and  (H)  mark values outside specified reference range.  ------------------------------------------------------------------- Prepared and Electronically  Authenticated by  Dorothye Gathers, M.D. 2018-01-16T17:11:04          ______________________________________________________________________________________________      EKG:        Recent Labs: 03/19/2023: B Natriuretic Peptide 45.4; BUN 11; Creatinine, Ser 0.78; Hemoglobin 15.4; Platelets 178; Potassium 3.3; Sodium 139  Recent Lipid Panel    Component Value Date/Time   CHOL 197 06/25/2022 1149   TRIG 123.0 06/25/2022 1149   HDL 62.20 06/25/2022 1149   CHOLHDL 3 06/25/2022 1149   VLDL 24.6 06/25/2022 1149   LDLCALC 110 (H) 06/25/2022 1149   LDLDIRECT 113.0 09/16/2019 1405     Risk Assessment/Calculations:                Physical Exam:    VS:  BP 126/76   Pulse 90   Ht 5\' 2"  (1.575 m)   Wt 222 lb 6.4 oz (100.9 kg)   SpO2 97%   BMI 40.68 kg/m     Wt Readings from Last 3 Encounters:  12/10/23 222 lb 6.4 oz (100.9 kg)  06/10/23 222 lb (100.7 kg)  06/10/23 222 lb (100.7 kg)     GEN:  Well nourished, well developed pleasant obese woman in no acute distress HEENT: Normal NECK: No JVD; No carotid bruits LYMPHATICS: No lymphadenopathy CARDIAC: RRR, no murmurs, rubs, gallops RESPIRATORY:  Clear to auscultation without rales, wheezing or rhonchi  ABDOMEN: Soft, non-tender, non-distended MUSCULOSKELETAL: 1+ bilateral ankle edema; No deformity  SKIN: Warm and dry NEUROLOGIC:  Alert and oriented x 3 PSYCHIATRIC:  Normal affect   Assessment & Plan SOB (shortness of breath) Suspect multifactorial.  She has morbid obesity with BMI 40.  I am sure deconditioning plays a role as well.  She has undergone cardiac assessment last in 2018 with echo and Myoview  stress test studies that are both personally reviewed today and showed no significant abnormalities.  I have recommended an updated echocardiogram to assess for LV systolic and diastolic dysfunction.  I do not think she needs further ischemic testing at this time.  I personally reviewed her CT scan of the chest that  demonstrates a large hiatal hernia with her entire stomach above the diaphragm and compressive atelectasis of the left lung.  I really do not know how much this could be playing a role in her dyspnea but will reach out to other providers to communicate that question with them.  She has been seen both by pulmonology and gastroenterology in the past.  Lifestyle modification is discussed with the patient.  She is not interested in pursuing GLP 1 treatment and I am not sure that she would be a candidate with her hiatal hernia.  I suspect she would be at high risk of surgical intervention on her hiatal hernia in the context of her obesity.  Will review her echocardiogram when available.  I will plan to see her back in 1 year for follow-up evaluation.      Medication Adjustments/Labs and Tests  Ordered: Current medicines are reviewed at length with the patient today.  Concerns regarding medicines are outlined above.  Orders Placed This Encounter  Procedures   ECHOCARDIOGRAM COMPLETE   No orders of the defined types were placed in this encounter.   Patient Instructions  Testing/Procedures: ECHO Your physician has requested that you have an echocardiogram. Echocardiography is a painless test that uses sound waves to create images of your heart. It provides your doctor with information about the size and shape of your heart and how well your heart's chambers and valves are working. This procedure takes approximately one hour. There are no restrictions for this procedure. Please do NOT wear cologne, perfume, aftershave, or lotions (deodorant is allowed). Please arrive 15 minutes prior to your appointment time.  Please note: We ask at that you not bring children with you during ultrasound (echo/ vascular) testing. Due to room size and safety concerns, children are not allowed in the ultrasound rooms during exams. Our front office staff cannot provide observation of children in our lobby area while testing is  being conducted. An adult accompanying a patient to their appointment will only be allowed in the ultrasound room at the discretion of the ultrasound technician under special circumstances. We apologize for any inconvenience.  Follow-Up: At [facility], you and your health needs are our priority.  As part of our continuing mission to provide you with exceptional heart care, our providers are all part of one team.  This team includes your primary Cardiologist (physician) and Advanced Practice Providers or APPs (Physician Assistants and Nurse Practitioners) who all work together to provide you with the care you need, when you need it.  Your next appointment:   1 year(s)  Provider:   Arnoldo Lapping, MD   1st Floor: - Lobby - Registration  - Pharmacy  - Lab - Cafe  2nd Floor: - PV Lab - Diagnostic Testing (echo, CT, nuclear med)  3rd Floor: - Vacant  4th Floor: - TCTS (cardiothoracic surgery) - AFib Clinic - Structural Heart Clinic - Vascular Surgery  - Vascular Ultrasound  5th Floor: - HeartCare Cardiology (general and EP) - Clinical Pharmacy for coumadin, hypertension, lipid, weight-loss medications, and med management appointments    Valet parking services will be available as well.     Signed, Arnoldo Lapping, MD  12/10/2023 5:51 PM    [facility]

## 2023-12-10 NOTE — Patient Instructions (Signed)
 Testing/Procedures: ECHO Your physician has requested that you have an echocardiogram. Echocardiography is a painless test that uses sound waves to create images of your heart. It provides your doctor with information about the size and shape of your heart and how well your heart's chambers and valves are working. This procedure takes approximately one hour. There are no restrictions for this procedure. Please do NOT wear cologne, perfume, aftershave, or lotions (deodorant is allowed). Please arrive 15 minutes prior to your appointment time.  Please note: We ask at that you not bring children with you during ultrasound (echo/ vascular) testing. Due to room size and safety concerns, children are not allowed in the ultrasound rooms during exams. Our front office staff cannot provide observation of children in our lobby area while testing is being conducted. An adult accompanying a patient to their appointment will only be allowed in the ultrasound room at the discretion of the ultrasound technician under special circumstances. We apologize for any inconvenience.  Follow-Up: At [facility], you and your health needs are our priority.  As part of our continuing mission to provide you with exceptional heart care, our providers are all part of one team.  This team includes your primary Cardiologist (physician) and Advanced Practice Providers or APPs (Physician Assistants and Nurse Practitioners) who all work together to provide you with the care you need, when you need it.  Your next appointment:   1 year(s)  Provider:   Tonny Bollman, MD           1st Floor: - Lobby - Registration  - Pharmacy  - Lab - Cafe  2nd Floor: - PV Lab - Diagnostic Testing (echo, CT, nuclear med)  3rd Floor: - Vacant  4th Floor: - TCTS (cardiothoracic surgery) - AFib Clinic - Structural Heart Clinic - Vascular Surgery  - Vascular Ultrasound  5th Floor: - HeartCare Cardiology (general and EP) -  Clinical Pharmacy for coumadin, hypertension, lipid, weight-loss medications, and med management appointments    Valet parking services will be available as well.

## 2023-12-23 ENCOUNTER — Ambulatory Visit (INDEPENDENT_AMBULATORY_CARE_PROVIDER_SITE_OTHER): Payer: Medicare Other | Admitting: Family Medicine

## 2023-12-23 ENCOUNTER — Encounter: Payer: Self-pay | Admitting: Family Medicine

## 2023-12-23 ENCOUNTER — Ambulatory Visit

## 2023-12-23 VITALS — BP 120/80 | HR 68 | Temp 97.3°F | Ht 62.0 in | Wt 224.0 lb

## 2023-12-23 DIAGNOSIS — E785 Hyperlipidemia, unspecified: Secondary | ICD-10-CM | POA: Diagnosis not present

## 2023-12-23 DIAGNOSIS — M79604 Pain in right leg: Secondary | ICD-10-CM

## 2023-12-23 DIAGNOSIS — E041 Nontoxic single thyroid nodule: Secondary | ICD-10-CM

## 2023-12-23 DIAGNOSIS — R239 Unspecified skin changes: Secondary | ICD-10-CM

## 2023-12-23 DIAGNOSIS — I10 Essential (primary) hypertension: Secondary | ICD-10-CM | POA: Diagnosis not present

## 2023-12-23 DIAGNOSIS — M79661 Pain in right lower leg: Secondary | ICD-10-CM | POA: Diagnosis not present

## 2023-12-23 DIAGNOSIS — R600 Localized edema: Secondary | ICD-10-CM | POA: Diagnosis not present

## 2023-12-23 NOTE — Progress Notes (Signed)
 Phone [phone] In person visit   Subjective:   Shelly Johnson is a 78 y.o. year old very pleasant female patient who presents for/with See problem oriented charting Chief Complaint  Patient presents with   Medical Management of Chronic Issues   Hypertension   Hyperlipidemia   metal taste in mouth    Pt c/o metal taste in mouth that she noticed 2 weeks ago   Joint Pain    Pt c/o joint pain and triggers   shin pain    Pt c/o tender right shin pain.    Past Medical History-  Patient Active Problem List   Diagnosis Date Noted   Fatty liver 04/27/2023    Priority: Medium    Severe sleep apnea 04/15/2023    Priority: Medium    Vitamin D  deficiency 09/16/2022    Priority: Medium    Hyperglycemia 01/27/2020    Priority: Medium    Hyperlipidemia, unspecified 09/14/2019    Priority: Medium    Numbness and tingling of both feet 01/21/2018    Priority: Medium    Depression, major, single episode, moderate (HCC) 06/20/2017    Priority: Medium    Essential hypertension 08/23/2016    Priority: Medium    Dyspnea on exertion 08/23/2016    Priority: Medium    ANEMIA-NOS 03/16/2008    Priority: Medium    DDD (degenerative disc disease), lumbar 11/16/2018    Priority: Low   Morbid obesity (HCC) 04/28/2018    Priority: Low   Hiatal hernia with gastroesophageal reflux 03/08/2008    Priority: Low   Pneumonia due to COVID-19 virus 03/19/2023    Medications- reviewed and updated Current Outpatient Medications  Medication Sig Dispense Refill   albuterol  (VENTOLIN  HFA) 108 (90 Base) MCG/ACT inhaler Inhale 2 puffs into the lungs every 6 (six) hours as needed for wheezing or shortness of breath. Use 30 minutes prior to activity. 8 g 2   chlorthalidone  (HYGROTON ) 25 MG tablet TAKE 1 TABLET BY MOUTH DAILY 90 tablet 2   ferrous sulfate  325 (65 FE) MG tablet Take 1 tablet (325 mg total) by mouth 2 (two) times daily. (Patient taking differently: Take 325 mg by mouth daily.) 60 tablet 3    naproxen sodium (ALEVE) 220 MG tablet Take 220 mg by mouth. As needed     omeprazole  (PRILOSEC) 40 MG capsule TAKE 1 CAPSULE BY MOUTH DAILY 90 capsule 2   vitamin C (ASCORBIC ACID) 500 MG tablet Take 500 mg by mouth daily.       VITAMIN D  PO Take 1 tablet by mouth daily.     No current facility-administered medications for this visit.     Objective:  BP 120/80   Pulse 68   Temp (!) 97.3 F (36.3 C)   Ht 5\' 2"  (1.575 m)   Wt 224 lb (101.6 kg)   SpO2 98%   BMI 40.97 kg/m  Gen: NAD, resting comfortably CV: RRR no murmurs rubs or gallops Lungs: CTAB no crackles, wheeze, rhonchi Abdomen: tender in upper abdomen Ext: no edema Skin: warm, dry     Assessment and Plan   # Joint pain S: Patient complains of pain in multiple joints including back pain and neck pain and just almost hurts diffusely.  Also has fatigue with these for years-referred to physical therapy in 2024-mildly helpful and still doing exercises.  Massage therapy not a good fit for her  - Also notes tenderness over right shin- redness over right shin and swelling for over a year  but seems to be worsening  A/P: ongoing dieffuse pain could be fibromyalgia  -advised against aleve until we know more recnet kidney function- she reports about 2 a day  #Right shin pain with skin canages and increased swelling- really uncler cause- she wants to look at underlying bone- will get x-ray and refer to dermatology -also offered DVT scan- she declines unless worsening- will reach out -almost looks like a healed venous ulcer honestly  # Metallic taste in mouth S: First noted metallic taste in her mouth about 2 weeks ago -no trauma or recent illness or nasal congestion. No memory change A/P: she wants to monitor this and let me know if worsening or fails to improve  # Shortness of breath S: Has seen cardiology 12/10/2023 Dr. Arlester Ladd an echocardiogram ordered but suspected to be multifactorial with morbid obesity and deconditioning.   Prior echocardiogram and Myoview  stress test in 2018. -Also has a very large hiatal hernia and some compressive atelectasis of the left lung which could be contributing-not interested in surgery  -Also saw pulmonology 06/09/2023  She reports not walking as much with joint pain and likely contributes- also gets some upper abdominal pain possible from hiatal hernia A/P: ongoing issues but stable and getting cardiac workup- wants to hold off on seeing surgeon    #hypertension S: medication: Chlorthalidone  25 mg A/P: stable- continue current medicines    #hyperlipidemia S: Medication:Has declined medication. -feels diet has a lot of room for improvement Lab Results  Component Value Date   CHOL 197 06/25/2022   HDL 62.20 06/25/2022   LDLCALC 110 (H) 06/25/2022   LDLDIRECT 113.0 09/16/2019   TRIG 123.0 06/25/2022   CHOLHDL 3 06/25/2022  A/P: lipids mildly high in past- declines repeat today  # GERD-has seen Dr. Elvin Hammer who is aware of hiatal hernia S:Medication: Omeprazole  40 mg A/P: overall stable- continue current medications    # Severe sleep apnea-compliant with CPAP in 2024 and into 2025   # Thyroid  nodule S: From show CT in 2024 "4. Enlarged left lobe of the thyroid  with potential 2.6 cm nodule.Recommend thyroid  ultrasound" A/P: thyroid  nodule- agrees to follow up ultrasound ordered today    # Concern for neuropathy with bilateral foot pain and has seen Dr. Genita Keys but declined EMG testing-did mention possible fibromyalgia  # Obesity- morbid with BMI over 40 S:weight largely stable recently  Wt Readings from Last 3 Encounters:  12/23/23 224 lb (101.6 kg)  12/10/23 222 lb 6.4 oz (100.9 kg)  06/10/23 222 lb (100.7 kg)  A/P: difficult for her but Encouraged need for healthy eating, regular exercise, weight loss.   #social update- some prolonged grieving and feeling overwhelmed with all she has to keep up with- she states she is not depressed  Recommended follow up: Return in  about 6 months (around 06/24/2024) for followup or sooner if needed.Schedule b4 you leave. Future Appointments  Date Time Provider Department Center  01/27/2024 11:30 AM [facility]    Lab/Order associations:   ICD-10-CM   1. Skin change  R23.9 Ambulatory referral to Dermatology    DG Tibia/Fibula Right    2. Right leg pain  M79.604 Ambulatory referral to Dermatology    DG Tibia/Fibula Right    3. Thyroid  nodule  E04.1 US  THYROID     4. Essential hypertension  I10     5. Hyperlipidemia, unspecified hyperlipidemia type  E78.5     6. Morbid obesity (HCC) Chronic E66.01       No orders  of the defined types were placed in this encounter.   Return precautions advised.  Clarisa Crooked, MD

## 2023-12-23 NOTE — Patient Instructions (Signed)
 Consider testanus and shingrix at pharmacy  You declined bloodwork today  We have placed a referral for you today to Dr. Del Favia dermatology- please call their # if you do not hear within a week (may be listed below or you may see mychart message within a few days with #).   If calf pain or worsening swelling- we need to reconsider the scanning for clot but seems like most issues related to your shin  We will call you within two weeks about your referral to thyroid  ultrasound  through [facility].  Their phone number is [phone].  Please call them if you have not heard in 1-2 weeks  Recommended follow up: Return in about 6 months (around 06/24/2024) for followup or sooner if needed.Schedule b4 you leave.

## 2023-12-30 ENCOUNTER — Ambulatory Visit
Admission: RE | Admit: 2023-12-30 | Discharge: 2023-12-30 | Disposition: A | Discharge status: Home / Self Care | Source: Ambulatory Visit | Attending: Family Medicine | Admitting: Family Medicine

## 2023-12-30 DIAGNOSIS — E041 Nontoxic single thyroid nodule: Secondary | ICD-10-CM | POA: Diagnosis not present

## 2023-12-31 ENCOUNTER — Ambulatory Visit: Payer: Self-pay | Admitting: Family Medicine

## 2024-01-05 ENCOUNTER — Telehealth: Payer: Self-pay

## 2024-01-05 NOTE — Telephone Encounter (Signed)
 Please schedule ov for this.  Copied from CRM [id number]. Topic: Clinical - Lab/Test Results >> Jan 04, 2024  4:48 PM Luane Rumps D wrote: Reason for CRM: Patient calling in regards to the thyroid  nodule seen by Dr. Arlene Ben, she wants more information before agreeing to a biopsy, where it was seen etc. and a picture if possible. She would like to speak on the phone with someone if possible who can show her + explain it in terminology that she can understand. She is also wondering what a biopsy consists of.

## 2024-01-08 ENCOUNTER — Encounter: Payer: Self-pay | Admitting: Family Medicine

## 2024-01-08 ENCOUNTER — Ambulatory Visit (INDEPENDENT_AMBULATORY_CARE_PROVIDER_SITE_OTHER): Admitting: Family Medicine

## 2024-01-08 VITALS — BP 138/86 | HR 79 | Temp 97.7°F | Wt 225.0 lb

## 2024-01-08 DIAGNOSIS — E041 Nontoxic single thyroid nodule: Secondary | ICD-10-CM | POA: Diagnosis not present

## 2024-01-08 DIAGNOSIS — I10 Essential (primary) hypertension: Secondary | ICD-10-CM

## 2024-01-08 NOTE — Progress Notes (Signed)
 Phone [phone] In person visit   Subjective:   Shelly Johnson is a 78 y.o. year old very pleasant female patient who presents for/with See problem oriented charting Chief Complaint  Patient presents with   Medical Management of Chronic Issues    Want to discuss the thyroid  nodule ultrasound and biopsy   Past Medical History-  Patient Active Problem List   Diagnosis Date Noted   Fatty liver 04/27/2023    Priority: Medium    Severe sleep apnea 04/15/2023    Priority: Medium    Vitamin D  deficiency 09/16/2022    Priority: Medium    Hyperglycemia 01/27/2020    Priority: Medium    Hyperlipidemia, unspecified 09/14/2019    Priority: Medium    Numbness and tingling of both feet 01/21/2018    Priority: Medium    Depression, major, single episode, moderate (HCC) 06/20/2017    Priority: Medium    Essential hypertension 08/23/2016    Priority: Medium    Dyspnea on exertion 08/23/2016    Priority: Medium    ANEMIA-NOS 03/16/2008    Priority: Medium    DDD (degenerative disc disease), lumbar 11/16/2018    Priority: Low   Morbid obesity (HCC) 04/28/2018    Priority: Low   Hiatal hernia with gastroesophageal reflux 03/08/2008    Priority: Low   Pneumonia due to COVID-19 virus 03/19/2023    Medications- reviewed and updated Current Outpatient Medications  Medication Sig Dispense Refill   albuterol  (VENTOLIN  HFA) 108 (90 Base) MCG/ACT inhaler Inhale 2 puffs into the lungs every 6 (six) hours as needed for wheezing or shortness of breath. Use 30 minutes prior to activity. 8 g 2   chlorthalidone  (HYGROTON ) 25 MG tablet TAKE 1 TABLET BY MOUTH DAILY 90 tablet 2   ferrous sulfate  325 (65 FE) MG tablet Take 1 tablet (325 mg total) by mouth 2 (two) times daily. (Patient taking differently: Take 325 mg by mouth daily.) 60 tablet 3   naproxen sodium (ALEVE) 220 MG tablet Take 220 mg by mouth. As needed     omeprazole  (PRILOSEC) 40 MG capsule TAKE 1 CAPSULE BY MOUTH DAILY 90 capsule 2    vitamin C (ASCORBIC ACID) 500 MG tablet Take 500 mg by mouth daily.       VITAMIN D  PO Take 1 tablet by mouth daily.     No current facility-administered medications for this visit.     Objective:  BP 138/86   Pulse 79   Temp 97.7 F (36.5 C)   Wt 225 lb (102.1 kg)   SpO2 96%   BMI 41.15 kg/m  Gen: NAD, resting comfortably     Assessment and Plan   # Thyroid  nodule S: Patient was seen on 12/23/2023 and we discussed enlarged left lobe of the thyroid  noted on prior CT from 2024 and they recommended ultrasound-we ordered that and nodule had increased from 2.6 cm to 3.4 cm in the left thyroid  and they recommended fine-needle aspiration/biopsy  She presents today to discuss this further A/P: We reviewed the CT scan ultrasound together and I showed her the nodular area and discussed what a biopsy may be like-she feels like she has a lot going on right now including some hand pain and trigger finger, being too sedentary, balance being appropriate and some financial stressors such as needing some work done on a pool but she understands the importance and the risk of cancer.  She is not fully committed to following through with this but agrees to at  least let me order interventional  radiology ultrasound guided biopsy-we ordered this today  #hypertension S: medication: Reports taking chlorthalidone  25 mg Home readings #s: Most recent home systolic reading was 112 A/P: High acceptable reading today but patient states she is anxious talking about the thyroid  nodule and was better controlled at home recently-she would like to continue current medicine   Recommended follow up: Return for next already scheduled visit or sooner if needed. Future Appointments  Date Time Provider Department Center  01/27/2024 11:30 AM [facility]  06/30/2024 11:00 AM Almira Jaeger, MD [facility]    Lab/Order associations:   ICD-10-CM   1. Thyroid  nodule  E04.1 US  FNA BX THYROID  1ST LESION  AFIRMA    2. Essential hypertension  I10       Time Spent: 30 minutes of total time (4:20 PM-4:50 PM) was spent on the date of the encounter performing the following actions: chart review prior to seeing the patient, obtaining history, reviewing scan together and discussing what a biopsy may be like, counseling on the treatment plan, placing orders, and documenting in our EHR.   -In addition to the above time another 3 minutes was spent completing charting from 7:19 PM to 7:22 PM  Return precautions advised.  Clarisa Crooked, MD

## 2024-01-08 NOTE — Patient Instructions (Addendum)
 Let us  know if you don't hear within a week about the thyroid  biopsy Thank you for being willing to move forward wit this to rule out cancer  I think compression stockings may help  Recommended follow up: Return for next already scheduled visit or sooner if needed.

## 2024-01-20 DIAGNOSIS — M79661 Pain in right lower leg: Secondary | ICD-10-CM | POA: Diagnosis not present

## 2024-01-20 DIAGNOSIS — M79605 Pain in left leg: Secondary | ICD-10-CM | POA: Diagnosis not present

## 2024-01-20 DIAGNOSIS — M79662 Pain in left lower leg: Secondary | ICD-10-CM | POA: Diagnosis not present

## 2024-01-20 DIAGNOSIS — M79604 Pain in right leg: Secondary | ICD-10-CM | POA: Diagnosis not present

## 2024-01-20 DIAGNOSIS — I87393 Chronic venous hypertension (idiopathic) with other complications of bilateral lower extremity: Secondary | ICD-10-CM | POA: Diagnosis not present

## 2024-01-26 DIAGNOSIS — D224 Melanocytic nevi of scalp and neck: Secondary | ICD-10-CM | POA: Diagnosis not present

## 2024-01-26 DIAGNOSIS — L900 Lichen sclerosus et atrophicus: Secondary | ICD-10-CM | POA: Diagnosis not present

## 2024-01-26 DIAGNOSIS — I872 Venous insufficiency (chronic) (peripheral): Secondary | ICD-10-CM | POA: Diagnosis not present

## 2024-01-26 DIAGNOSIS — D230 Other benign neoplasm of skin of lip: Secondary | ICD-10-CM | POA: Diagnosis not present

## 2024-01-27 ENCOUNTER — Ambulatory Visit (HOSPITAL_COMMUNITY)
Admission: RE | Admit: 2024-01-27 | Discharge: 2024-01-27 | Disposition: A | Discharge status: Home / Self Care | Source: Ambulatory Visit | Attending: Cardiology | Admitting: Cardiology

## 2024-01-27 DIAGNOSIS — R0602 Shortness of breath: Secondary | ICD-10-CM

## 2024-01-27 LAB — ECHOCARDIOGRAM COMPLETE
Area-P 1/2: 3.66 cm2
S' Lateral: 2.5 cm

## 2024-01-31 ENCOUNTER — Ambulatory Visit: Payer: Self-pay | Admitting: Cardiovascular Disease

## 2024-02-03 ENCOUNTER — Ambulatory Visit
Admission: RE | Admit: 2024-02-03 | Discharge: 2024-02-03 | Disposition: A | Discharge status: Home / Self Care | Source: Ambulatory Visit | Attending: Family Medicine | Admitting: Family Medicine

## 2024-02-03 ENCOUNTER — Other Ambulatory Visit (HOSPITAL_COMMUNITY)
Admission: RE | Admit: 2024-02-03 | Discharge: 2024-02-03 | Disposition: A | Discharge status: Home / Self Care | Source: Ambulatory Visit | Attending: Family Medicine | Admitting: Family Medicine

## 2024-02-03 DIAGNOSIS — E041 Nontoxic single thyroid nodule: Secondary | ICD-10-CM | POA: Diagnosis not present

## 2024-02-03 DIAGNOSIS — E079 Disorder of thyroid, unspecified: Secondary | ICD-10-CM | POA: Diagnosis not present

## 2024-02-05 ENCOUNTER — Ambulatory Visit: Payer: Self-pay | Admitting: Family Medicine

## 2024-02-05 LAB — CYTOLOGY - NON PAP

## 2024-02-16 ENCOUNTER — Telehealth: Payer: Self-pay

## 2024-02-16 NOTE — Telephone Encounter (Signed)
 Do you want pt to come in office to discuss this?  Copied from CRM [id number]. Topic: Clinical - Lab/Test Results >> Feb 11, 2024  3:09 PM Shelly Johnson wrote: Reason for CRM: Patient called to go over her cytology report. Patient is requesting a call back and can be reached at [phone].

## 2024-02-16 NOTE — Telephone Encounter (Signed)
 We do not have enough information quite yet-it is awaiting affirm confirmatory testing-I would have her reach back out in 2 weeks if she does not hear from us 

## 2024-02-16 NOTE — Telephone Encounter (Signed)
Called and spoke with pt and below message given.

## 2024-02-18 ENCOUNTER — Ambulatory Visit: Payer: Self-pay | Admitting: Family Medicine

## 2024-02-18 ENCOUNTER — Encounter (HOSPITAL_COMMUNITY): Payer: Self-pay

## 2024-02-18 DIAGNOSIS — E041 Nontoxic single thyroid nodule: Secondary | ICD-10-CM

## 2024-03-02 ENCOUNTER — Ambulatory Visit: Payer: Self-pay

## 2024-03-02 NOTE — Telephone Encounter (Signed)
 FYI Only or Action Required?: FYI only for provider.  Patient was last seen in primary care on 01/08/2024 by Katrinka Garnette KIDD, MD.  Called Nurse Triage reporting Headache.  Symptoms began a week ago.  Interventions attempted: OTC medications: Coricidan HBP, cough drops, Aleve and Rest, hydration, or home remedies.  Symptoms are: gradually improving.  Triage Disposition: Home Care  Patient/caregiver understands and will follow disposition?: Yes  Patient has been fighting illness for a week now, it started with a sore throat and made it's way up into her sinuses with a headache. She has been taking medications over the counter: coricidan hbp, lozenges + cough drops, aleve which has been somewhat helpful but now her tongue is sore. She has never had this happen before. She is wondering if this is normal, if there is an illness going around.  Reason for Disposition  Headache  Answer Assessment - Initial Assessment Questions 1. LOCATION: Where does it hurt?      Frontal headache 2. ONSET: When did the headache start? (e.g., minutes, hours, days)      Last Tuesday 3. PATTERN: Does the pain come and go, or has it been constant since it started?     constant 4. SEVERITY: How bad is the pain? and What does it keep you from doing?  (e.g., Scale 1-10; mild, moderate, or severe)     Mild-moderate 5. RECURRENT SYMPTOM: Have you ever had headaches before? If Yes, ask: When was the last time? and What happened that time?      no 6. CAUSE: What do you think is causing the headache?     Possible cold 7. MIGRAINE: Have you been diagnosed with migraine headaches? If Yes, ask: Is this headache similar?      no 8. HEAD INJURY: Has there been any recent injury to your head?      no 9. OTHER SYMPTOMS: Do you have any other symptoms? (e.g., fever, stiff neck, eye pain, sore throat, cold symptoms)     Sore throat, cold symptoms  Protocols used: Headache-A-AH

## 2024-03-03 NOTE — Telephone Encounter (Signed)
Please schedule ov with available provider to be evaluated.

## 2024-03-03 NOTE — Telephone Encounter (Signed)
 Spoke w/ pt who declined ov , states symptoms have improved .

## 2024-03-11 ENCOUNTER — Ambulatory Visit: Payer: Self-pay

## 2024-03-11 NOTE — Telephone Encounter (Signed)
 Can they use a hosp follow up/same day slot for this for when I get back from vacation or perhaps have her see a colleague next week if she agrees?

## 2024-03-11 NOTE — Telephone Encounter (Signed)
  FYI Only or Action Required?: Action required by provider: request for appointment.  Patient was last seen in primary care on 01/08/2024 by Katrinka Garnette KIDD, MD.  Called Nurse Triage reporting Back Pain, Facial Pain, and Mouth Lesions.  Symptoms began several weeks ago.  Interventions attempted: OTC medications: Aleve.  Symptoms are: stable.  Triage Disposition: See PCP Within 2 Weeks  Patient/caregiver understands and will follow disposition?: Yes, but will wait                             Copied from CRM [id number]. Topic: Clinical - Red Word Triage >> Mar 11, 2024 10:45 AM Adelita E wrote: [facility] that prompted transfer to Nurse Triage: Back and buttock pain, patient stated she might have pulled something. Patient also has stress ulcers on tongue from root canal back in June. Also having some sinus issues and headaches. Symptoms going on for 3 weeks. Reason for Disposition  Back pain present > 2 weeks  Answer Assessment - Initial Assessment Questions 1. ONSET: When did the pain begin? (e.g., minutes, hours, days)     3-4 weeks, states pain has been staying the same 2. LOCATION: Where does it hurt? (upper, mid or lower back)     Center of lower back and right buttock  3. SEVERITY: How bad is the pain?  (e.g., Scale 1-10; mild, moderate, or severe)     When I move a certain way, it's a 9, states pain is mostly 3-4 4. PATTERN: Is the pain constant? (e.g., yes, no; constant, intermittent)      States pain is constant, intensifies when moving a certain way 5. RADIATION: Does the pain shoot into your legs or somewhere else?     Right buttocks  6. CAUSE:  What do you think is causing the back pain?      Pulled a muscle  7. BACK OVERUSE:  Any recent lifting of heavy objects, strenuous work or exercise?     Moving boxes 8. MEDICINES: What have you taken so far for the pain? (e.g., nothing, acetaminophen , NSAIDS)     Aleve 9.  NEUROLOGIC SYMPTOMS: Do you have any weakness, numbness, or problems with bowel/bladder control?     Numbness in hands and feet at baseline, states pain has affect balance 10. OTHER SYMPTOMS: Do you have any other symptoms? (e.g., fever, abdomen pain, burning with urination, blood in urine)     Mouth ulcers, sinus/facial pain related to recent root canal, sinus headaches  Sinus congestion that comes and goes. States this has been ongoing since root canal 5 weeks ago. States she followed up with dentist yesterday and everything looked normal.   No availability with PCP until 04/19/24. Patient only wanted to see PCP and declined seeing other providers sooner. Scheduled patient for first available with PCP. Please advise on scheduling sooner if possible.  Protocols used: Back Pain-A-AH

## 2024-03-11 NOTE — Telephone Encounter (Signed)
 Please see triage note. Patient is scheduled for an acute visit 04/19/2024.

## 2024-03-15 ENCOUNTER — Ambulatory Visit: Payer: Self-pay | Admitting: Surgery

## 2024-03-15 DIAGNOSIS — E041 Nontoxic single thyroid nodule: Secondary | ICD-10-CM | POA: Diagnosis not present

## 2024-03-15 DIAGNOSIS — D440 Neoplasm of uncertain behavior of thyroid gland: Secondary | ICD-10-CM | POA: Diagnosis not present

## 2024-03-22 ENCOUNTER — Ambulatory Visit (INDEPENDENT_AMBULATORY_CARE_PROVIDER_SITE_OTHER): Admitting: Family Medicine

## 2024-03-22 ENCOUNTER — Encounter: Payer: Self-pay | Admitting: Family Medicine

## 2024-03-22 VITALS — BP 120/74 | HR 82 | Temp 97.2°F | Ht 62.0 in | Wt 221.0 lb

## 2024-03-22 DIAGNOSIS — E041 Nontoxic single thyroid nodule: Secondary | ICD-10-CM

## 2024-03-22 DIAGNOSIS — I10 Essential (primary) hypertension: Secondary | ICD-10-CM

## 2024-03-22 NOTE — Patient Instructions (Addendum)
 I wish you the best in your decision making. I would recommend the surgery to be safe side  I am here for you if you change your mind on labs or have other questoins  Recommended follow up: Return for next already scheduled visit or sooner if needed.

## 2024-03-22 NOTE — Progress Notes (Signed)
 Phone [phone] In person visit   Subjective:   Shelly Johnson is a 78 y.o. year old very pleasant female patient who presents for/with See problem oriented charting Chief Complaint  Patient presents with   Back Pain   Mouth Lesions    Pt c/o mouth lesions all over tongue that she noticed after having root canal last month.   sinus pressure    Pt c/o sinus pressure after root canal   Past Medical History-  Patient Active Problem List   Diagnosis Date Noted   Fatty liver 04/27/2023    Priority: Medium    Severe sleep apnea 04/15/2023    Priority: Medium    Vitamin D  deficiency 09/16/2022    Priority: Medium    Hyperglycemia 01/27/2020    Priority: Medium    Hyperlipidemia, unspecified 09/14/2019    Priority: Medium    Numbness and tingling of both feet 01/21/2018    Priority: Medium    Depression, major, single episode, moderate (HCC) 06/20/2017    Priority: Medium    Essential hypertension 08/23/2016    Priority: Medium    Dyspnea on exertion 08/23/2016    Priority: Medium    ANEMIA-NOS 03/16/2008    Priority: Medium    DDD (degenerative disc disease), lumbar 11/16/2018    Priority: Low   Morbid obesity (HCC) 04/28/2018    Priority: Low   Hiatal hernia with gastroesophageal reflux 03/08/2008    Priority: Low   Pneumonia due to COVID-19 virus 03/19/2023    Medications- reviewed and updated Current Outpatient Medications  Medication Sig Dispense Refill   albuterol  (VENTOLIN  HFA) 108 (90 Base) MCG/ACT inhaler Inhale 2 puffs into the lungs every 6 (six) hours as needed for wheezing or shortness of breath. Use 30 minutes prior to activity. 8 g 2   chlorthalidone  (HYGROTON ) 25 MG tablet TAKE 1 TABLET BY MOUTH DAILY 90 tablet 2   ferrous sulfate  325 (65 FE) MG tablet Take 1 tablet (325 mg total) by mouth 2 (two) times daily. 60 tablet 3   naproxen sodium (ALEVE) 220 MG tablet Take 220 mg by mouth. As needed     omeprazole  (PRILOSEC) 40 MG capsule TAKE 1 CAPSULE  BY MOUTH DAILY 90 capsule 2   vitamin C (ASCORBIC ACID) 500 MG tablet Take 500 mg by mouth daily.       VITAMIN D  PO Take 1 tablet by mouth daily.     No current facility-administered medications for this visit.     Objective:  BP 120/74   Pulse 82   Temp (!) 97.2 F (36.2 C)   Ht 5' 2 (1.575 m)   Wt 221 lb (100.2 kg)   SpO2 96%   BMI 40.42 kg/m  Gen: NAD, resting comfortably CV: RRR no murmurs rubs or gallops Lungs: CTAB no crackles, wheeze, rhonchi Ext: no edema Skin: warm, dry MSK: No midline back pain.  Some paraspinous muscle tenderness    Assessment and Plan   #firmly declines bloodwork today despite recommendation given diffuse aches and something not being right per patient- she understands this could lead to missed or delayed diagnosis  #thyroid  nodule with 50% chance of malignancy- planning on surgery with Dr. Eletha but not 100% decided. Has not shared with family. With qualify of life with pain issues is even considering avoiding this.   # Mouth lesions and sinus pressure S: About a month ago patient had a root canal.  Since that time she has noted multiple lesions on her tongue-they appear  to be prominent 10 spots but apparently her dentist thought they could be ulcerations.  She also has had some sinus pressure after the root canal ongoing for the last month- maxillary sinus pain. Voice change. She did have azithromycin  early June  She also reports long-term issues with back pain and right hip pain.  She feels like she has been more achy in other areas as well such as her hands and she reports all over.  No midline pain.  Perhaps worse after procedure A/P: As far as mouth lesions I do not see any active ulcerations-taste buds do appear prominent-could be lie bumps but seem rather persistent.  Perhaps irritant related after her procedure-she wants to monitor only for now.  Dentist thought could be stress related which I think is certainly possible especially with  the thyroid  nodule issues listed above  As far as her sinus pressure I offered Augmentin  or doxycycline  for potentially persistent bacterial sinusitis-she is worried about her stomach sensitivity and wants to hold off for now.   As far as diffuse pain-offered blood work evaluation but she declines.  Also offered sports medicine orthopedic referral but she declines.  She mainly wanted to express how difficult these things have been for her and have a listening ear-I told her I would love to help her as well if she changes her mind for all these issues.  Has declined therapy in the past  #hypertension S: medication: Chlorthalidone  25 mg daily  A/P: Well-controlled-continue current medication   Recommended follow up: Return for next already scheduled visit or sooner if needed. Future Appointments  Date Time Provider Department Center  06/30/2024 10:00 AM [facility] ANNUAL WELLNESS VISIT 1 [facility]  06/30/2024 11:00 AM Katrinka Garnette KIDD, MD [facility]    Lab/Order associations:   ICD-10-CM   1. Thyroid  nodule  E04.1     2. Essential hypertension  I10      I personally spent a total of 35 minutes in the care of the patient today including preparing to see the patient, getting/reviewing separately obtained history, performing a medically appropriate exam/evaluation, counseling and educating, documenting clinical information in the EHR, and communicating results.   Garnette Katrinka, MD

## 2024-03-22 NOTE — Addendum Note (Signed)
 Addended by: KATRINKA GARNETTE KIDD on: 03/22/2024 12:30 PM   Modules accepted: Level of Service

## 2024-04-19 ENCOUNTER — Ambulatory Visit: Admitting: Family Medicine

## 2024-05-03 ENCOUNTER — Telehealth: Payer: Self-pay

## 2024-05-03 NOTE — Telephone Encounter (Signed)
 Copied from CRM [id number]. Topic: Clinical - Medical Advice >> May 03, 2024 10:58 AM Essie A wrote: Reason for CRM: Patient said she received a bill for CPAP machine and supplies for March. [facility] called the office and was told that patient needed to schedule an appointment before it can be approved.  Please return her call at [phone] for clarity.  Thanks.   Spoke w/ Patient scheduled OV for over due / osa   -NFN

## 2024-05-05 ENCOUNTER — Telehealth: Payer: Self-pay | Admitting: Family Medicine

## 2024-05-05 ENCOUNTER — Other Ambulatory Visit: Payer: Self-pay | Admitting: Family Medicine

## 2024-05-05 DIAGNOSIS — Z1231 Encounter for screening mammogram for malignant neoplasm of breast: Secondary | ICD-10-CM

## 2024-05-05 NOTE — Patient Instructions (Signed)
 SURGICAL WAITING ROOM VISITATION  Patients having surgery or a procedure may have no more than 2 support people in the waiting area - these visitors may rotate.    Children under the age of 79 must have an adult with them who is not the patient.  Visitors with respiratory illnesses are discouraged from visiting and should remain at home.  If the patient needs to stay at the hospital during part of their recovery, the visitor guidelines for inpatient rooms apply. Pre-op nurse will coordinate an appropriate time for 1 support person to accompany patient in pre-op.  This support person may not rotate.    Please refer to the [facility] website for the visitor guidelines for Inpatients (after your surgery is over and you are in a regular room).       Your procedure is scheduled on: 05/09/2024    Report to [facility] Main Entrance    Report to admitting at  1030 AM   Call this number if you have problems the morning of surgery [phone]   Do not eat food :After Midnight.   After Midnight you may have the following liquids until _ 0930_____ AM DAY OF SURGERY  Water Non-Citrus Juices (without pulp, NO RED-Apple, White grape, White cranberry) Black Coffee (NO MILK/CREAM OR CREAMERS, sugar ok)  Clear Tea (NO MILK/CREAM OR CREAMERS, sugar ok) regular and decaf                             Plain Jell-O (NO RED)                                           Fruit ices (not with fruit pulp, NO RED)                                     Popsicles (NO RED)                                                               Sports drinks like Gatorade (NO RED)                   The day of surgery:  Drink ONE (1) Pre-Surgery Clear Ensure or G2 at  0930AM the morning of surgery. Drink in one sitting. Do not sip.  This drink was given to you during your hospital  pre-op appointment visit. Nothing else to drink after completing the  Pre-Surgery Clear Ensure or G2.          If you have  questions, please contact your surgeon's office.       Oral Hygiene is also important to reduce your risk of infection.                                    Remember - BRUSH YOUR TEETH THE MORNING OF SURGERY WITH YOUR REGULAR TOOTHPASTE  DENTURES WILL BE REMOVED PRIOR TO SURGERY PLEASE DO NOT APPLY Poly grip OR ADHESIVES!!!  Do NOT smoke after Midnight   Stop all vitamins and herbal supplements 7 days before surgery.   Take these medicines the morning of surgery with A SIP OF WATER:  inhalers as usual and bring, omeprazole    DO NOT TAKE ANY ORAL DIABETIC MEDICATIONS DAY OF YOUR SURGERY  Bring CPAP mask and tubing day of surgery.                              You may not have any metal on your body including hair pins, jewelry, and body piercing             Do not wear make-up, lotions, powders, perfumes/cologne, or deodorant  Do not wear nail polish including gel and S&S, artificial/acrylic nails, or any other type of covering on natural nails including finger and toenails. If you have artificial nails, gel coating, etc. that needs to be removed by a nail salon please have this removed prior to surgery or surgery may need to be canceled/ delayed if the surgeon/ anesthesia feels like they are unable to be safely monitored.   Do not shave  48 hours prior to surgery.               Men may shave face and neck.   Do not bring valuables to the hospital. [facility] IS NOT             RESPONSIBLE   FOR VALUABLES.   Contacts, glasses, dentures or bridgework may not be worn into surgery.   Bring small overnight bag day of surgery.   DO NOT BRING YOUR HOME MEDICATIONS TO THE HOSPITAL. PHARMACY WILL DISPENSE MEDICATIONS LISTED ON YOUR MEDICATION LIST TO YOU DURING YOUR ADMISSION IN THE HOSPITAL!    Patients discharged on the day of surgery will not be allowed to drive home.  Someone NEEDS to stay with you for the first 24 hours after anesthesia.   Special Instructions: Bring a copy of  your healthcare power of attorney and living will documents the day of surgery if you haven't scanned them before.              Please read over the following fact sheets you were given: IF YOU HAVE QUESTIONS ABOUT YOUR PRE-OP INSTRUCTIONS PLEASE CALL [phone].   If you received a COVID test during your pre-op visit  it is requested that you wear a mask when out in public, stay away from anyone that may not be feeling well and notify your surgeon if you develop symptoms. If you test positive for Covid or have been in contact with anyone that has tested positive in the last 10 days please notify you surgeon.    [facility] - Preparing for Surgery Before surgery, you can play an important role.  Because skin is not sterile, your skin needs to be as free of germs as possible.  You can reduce the number of germs on your skin by washing with CHG (chlorahexidine gluconate) soap before surgery.  CHG is an antiseptic cleaner which kills germs and bonds with the skin to continue killing germs even after washing. Please DO NOT use if you have an allergy to CHG or antibacterial soaps.  If your skin becomes reddened/irritated stop using the CHG and inform your nurse when you arrive at Short Stay. Do not shave (including legs and underarms) for at least 48 hours prior to the first CHG shower.  You may shave  your face/neck. Please follow these instructions carefully:  1.  Shower with CHG Soap the night before surgery and the  morning of Surgery.  2.  If you choose to wash your hair, wash your hair first as usual with your  normal  shampoo.  3.  After you shampoo, rinse your hair and body thoroughly to remove the  shampoo.                           4.  Use CHG as you would any other liquid soap.  You can apply chg directly  to the skin and wash                       Gently with a scrungie or clean washcloth.  5.  Apply the CHG Soap to your body ONLY FROM THE NECK DOWN.   Do not use on face/ open                            Wound or open sores. Avoid contact with eyes, ears mouth and genitals (private parts).                       Wash face,  Genitals (private parts) with your normal soap.             6.  Wash thoroughly, paying special attention to the area where your surgery  will be performed.  7.  Thoroughly rinse your body with warm water from the neck down.  8.  DO NOT shower/wash with your normal soap after using and rinsing off  the CHG Soap.                9.  Pat yourself dry with a clean towel.            10.  Wear clean pajamas.            11.  Place clean sheets on your bed the night of your first shower and do not  sleep with pets. Day of Surgery : Do not apply any lotions/deodorants the morning of surgery.  Please wear clean clothes to the hospital/surgery center.  FAILURE TO FOLLOW THESE INSTRUCTIONS MAY RESULT IN THE CANCELLATION OF YOUR SURGERY PATIENT SIGNATURE_________________________________  NURSE SIGNATURE__________________________________  ________________________________________________________________________

## 2024-05-05 NOTE — Telephone Encounter (Signed)
 Noted. Will have Dr. Katrinka add any labs he may want if he feels it is necessary.    Copied from CRM #[id number]. Topic: Appointments - Appointment Info/Confirmation >> May 05, 2024  3:21 PM Emylou G wrote: Patient called.. wanted adv having 10:00 AM - Pre-Admission Testing 60 and surgery on Monday - if you need bloodwork she is doing it tomorrow.. if you need anything more send it to them.

## 2024-05-06 ENCOUNTER — Telehealth: Payer: Self-pay | Admitting: Cardiovascular Disease

## 2024-05-06 ENCOUNTER — Other Ambulatory Visit: Payer: Self-pay

## 2024-05-06 ENCOUNTER — Encounter (HOSPITAL_COMMUNITY): Payer: Self-pay

## 2024-05-06 ENCOUNTER — Encounter (HOSPITAL_COMMUNITY)
Admission: RE | Admit: 2024-05-06 | Discharge: 2024-05-06 | Disposition: A | Discharge status: Home / Self Care | Source: Ambulatory Visit | Attending: Surgery | Admitting: Surgery

## 2024-05-06 ENCOUNTER — Telehealth: Payer: Self-pay

## 2024-05-06 DIAGNOSIS — D440 Neoplasm of uncertain behavior of thyroid gland: Secondary | ICD-10-CM | POA: Diagnosis not present

## 2024-05-06 DIAGNOSIS — Z01818 Encounter for other preprocedural examination: Secondary | ICD-10-CM | POA: Diagnosis not present

## 2024-05-06 DIAGNOSIS — G4730 Sleep apnea, unspecified: Secondary | ICD-10-CM | POA: Diagnosis not present

## 2024-05-06 DIAGNOSIS — I10 Essential (primary) hypertension: Secondary | ICD-10-CM | POA: Insufficient documentation

## 2024-05-06 HISTORY — DX: Sleep apnea, unspecified: G47.30

## 2024-05-06 HISTORY — DX: Essential (primary) hypertension: I10

## 2024-05-06 HISTORY — DX: Malignant (primary) neoplasm, unspecified: C80.1

## 2024-05-06 HISTORY — DX: Dyspnea, unspecified: R06.00

## 2024-05-06 LAB — CBC
HCT: 48.5 % — ABNORMAL HIGH (ref 36.0–46.0)
Hemoglobin: 15 g/dL (ref 12.0–15.0)
MCH: 28.7 pg (ref 26.0–34.0)
MCHC: 30.9 g/dL (ref 30.0–36.0)
MCV: 92.9 fL (ref 80.0–100.0)
Platelets: 194 K/uL (ref 150–400)
RBC: 5.22 MIL/uL — ABNORMAL HIGH (ref 3.87–5.11)
RDW: 13.7 % (ref 11.5–15.5)
WBC: 6.2 K/uL (ref 4.0–10.5)
nRBC: 0 % (ref 0.0–0.2)

## 2024-05-06 LAB — BASIC METABOLIC PANEL WITH GFR
Anion gap: 14 (ref 5–15)
BUN: 17 mg/dL (ref 8–23)
CO2: 28 mmol/L (ref 22–32)
Calcium: 9.5 mg/dL (ref 8.9–10.3)
Chloride: 101 mmol/L (ref 98–111)
Creatinine, Ser: 0.82 mg/dL (ref 0.44–1.00)
GFR, Estimated: >60 mL/min (ref >60)
Glucose, Bld: 107 mg/dL — ABNORMAL HIGH (ref 70–99)
Potassium: 3.6 mmol/L (ref 3.5–5.1)
Sodium: 143 mmol/L (ref 135–145)

## 2024-05-06 NOTE — Telephone Encounter (Signed)
 I saw her in April and she had no ischemic symptoms. An echo was normal. She is at low risk of cardiac complications and can proceed with surgery from my perspective.

## 2024-05-06 NOTE — Telephone Encounter (Signed)
 Copied from CRM #[id number]. Topic: General - Other >> May 06, 2024  9:25 AM Leila C wrote: Reason for CRM: Patient is having thyroid  surgey by Dr. Eletha on Monday 05/09/24 and was advised in the pre-instructions to contact pulmonologist/cardiologist that patient will have surgery, not medical clearance needed. Patient does have an upcoming appointment with NP, Hope 05/16/24 at 1:30 pm for cpap refills. Patient does not know why this information needs to be told, and just doing as you're told. If needed, please call patient at [phone].  FYI  Called and spoke with the patient and advised patient to see if she needed surgical clearance/ any forms signed from Pulmonology. Pt will contact office ASAP to let us  know.  Pts surgery is scheduled for 9/22 and has not been seen since 06/10/2023, so if patient does need surgical clearance she will have to have office visit.

## 2024-05-06 NOTE — Telephone Encounter (Signed)
 FYI - pt called in to inform Dr. Wonda she is having surgery on Monday

## 2024-05-06 NOTE — Progress Notes (Signed)
 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 never smoker with h/o HTN, sleep apnea, left thyroid  nodule scheduled for above procedure 05/09/24 with Dr. Boas Eletha.   Per cardiology, I saw her in April and she had no ischemic symptoms. An echo was normal. She is at low risk of cardiac complications and can proceed with surgery from my perspective.  Pt reports she can climb a flight of stairs without chest pain or shortness of breath.  VS: BP (!) 163/85   Pulse 79   Temp 36.9 C (Oral)   Resp 16   Ht 5' 2 (1.575 m)   Wt 97.1 kg   SpO2 99%   BMI 39.14 kg/m   PROVIDERS: Katrinka Garnette KIDD, MD is PCP   Wonda Sharper, MD is Cardiologist  LABS: Labs reviewed: Acceptable for surgery. (all labs ordered are listed, but only abnormal results are displayed)  Labs Reviewed  BASIC METABOLIC PANEL WITH GFR - Abnormal; Notable for the following components:      Result Value   Glucose, Bld 107 (*)    All other components within normal limits  CBC - Abnormal; Notable for the following components:   RBC 5.22 (*)    HCT 48.5 (*)    All other components within normal limits     IMAGES:   EKG:   CV: Echo 01/27/24 1. Left ventricular ejection fraction, by estimation, is 55 to 60%. Left  ventricular ejection fraction by 3D volume is 55 %. The left ventricle has  normal function. The left ventricle has no regional wall motion  abnormalities. Left ventricular diastolic   parameters were normal. The average left ventricular global longitudinal  strain is -19.8 %. The global  longitudinal strain is normal.   2. Right ventricular systolic function is normal. The right ventricular  size is normal. There is normal pulmonary artery systolic pressure.   3. The mitral valve is normal in structure. Trivial mitral valve  regurgitation. No evidence of mitral stenosis.   4. The aortic valve is tricuspid. There is mild thickening of the aortic  valve. Aortic valve regurgitation is not visualized. No aortic stenosis is  present.   5. The inferior vena cava is normal in size with greater than 50%  respiratory variability, suggesting right atrial pressure of 3 mmHg.  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 ? ovaries and cervix.    APPENDECTOMY     HIATAL HERNIA REPAIR      MEDICATIONS:  albuterol  (VENTOLIN  HFA) 108 (90 Base) MCG/ACT inhaler   aspirin EC 81 MG tablet   carboxymethylcellulose (REFRESH PLUS) 0.5 % SOLN   chlorthalidone  (HYGROTON ) 25 MG tablet   diclofenac Sodium (VOLTAREN ARTHRITIS PAIN) 1 % GEL   ferrous sulfate  325 (65 FE) MG tablet   naproxen sodium (ALEVE) 220 MG tablet   NON FORMULARY   omeprazole  (PRILOSEC) 40 MG capsule   vitamin C (ASCORBIC ACID) 500 MG tablet   VITAMIN D  PO   No current facility-administered medications for this encounter.    Harlene Hoots Ward, PA-C [facility] Pre-Surgical Testing [phone]

## 2024-05-06 NOTE — Progress Notes (Addendum)
 Anesthesia Review:  PCP: Garnette Lukes  Cardiologist : Wonda  clearance in telephone note dated 05/06/2024   [facility] [employer] [facility] and spoke with Nat in Triage because pt came in stating she was called by someone from hospital stating she needed clearances for surgery from MDs.  PT could not tell me who called her.  PT was informed that Nat from [facility] would call her if any issues with clearances per Nat.   PPM/ ICD: Device Orders: Rep Notified:  Chest x-ray : EKG :  05/06/24  Echo : Stress test: Cardiac Cath :   Activity level: can do a flight of stairs but is winded when gets to top of stairs per pt  Sleep Study/ CPAP : has cpap  Fasting Blood Sugar :      / Checks Blood Sugar -- times a day:    Blood Thinner/ Instructions /Last Dose: ASA / Instructions/ Last Dose :    PT is very nervoux and anxious at preop appt.  PT was 25 minutes late for preop appt.  PT is petrified over needles.  At time of preop appt pt was hyperventilating at time of blood draw.   PT has not told any family , friends or neighbors she is having surgery.   Instructed pt at preop she needs to notify her sons , friends and family she is having surgery for support.  PT voiced understanding.  PT has had 3 family members to not come home after surgeries per pt.     PT has questions about surgery.  To sign consent day of surgery after talking with DR Eletha.

## 2024-05-06 NOTE — Telephone Encounter (Signed)
 Spoke with pt regarding her upcoming procedure. Pt is scheduled to have a thyroid  lobectomy on 9/22. Pt stated she was given instructions that say to let her cardiologist know that she is having surgery. Pt was not aware that she needed cardiac clearance. Pt was on her way to [facility] for a preop appointment and plans to ask them to send us  paperwork. Pt was told that our preop team would be notified. Pt verbalized understanding. All questions if any were answered.

## 2024-05-08 ENCOUNTER — Encounter (HOSPITAL_COMMUNITY): Payer: Self-pay | Admitting: Surgery

## 2024-05-08 DIAGNOSIS — E041 Nontoxic single thyroid nodule: Secondary | ICD-10-CM | POA: Diagnosis present

## 2024-05-08 DIAGNOSIS — D440 Neoplasm of uncertain behavior of thyroid gland: Secondary | ICD-10-CM | POA: Diagnosis present

## 2024-05-08 NOTE — H&P (Signed)
 REFERRING PHYSICIAN: Katrinka Garnette KIDD, MD  PROVIDER: Emorie Mcfate OZELL SPINNER, MD   Chief Complaint: New Consultation (Thyroid  neoplasm of uncertain behavior)  History of Present Illness:  Patient is referred by Dr. Garnette Katrinka for surgical evaluation and management of a newly diagnosed thyroid  neoplasm of uncertain behavior. Patient had undergone a chest CT in November 2024. Incidental finding was made of a left thyroid  nodule. Patient underwent an ultrasound examination of the neck in May 2025. This demonstrated a solitary nodule in the mid left thyroid  this was felt to be mildly suspicious and fine-needle aspiration biopsy was recommended. Biopsy was performed on February 03, 2024. This demonstrated atypia of undetermined significance, Bethesda category III. The sample was submitted for molecular genetic testing with AFIRMA. This returned with a result of suspicious, rendering a risk of malignancy of 50%. The patient is now referred to surgery for consideration for resection for definitive diagnosis and management. Patient has no prior history of thyroid  disease. She has never been on thyroid  medication. She has had no prior head or neck surgery. There is no family history of thyroid  disease and specifically no history of thyroid  cancer.  Review of Systems: A complete review of systems was obtained from the patient. I have reviewed this information and discussed as appropriate with the patient. See HPI as well for other ROS.  Review of Systems  Constitutional: Negative.  HENT: Negative.  Eyes: Negative.  Respiratory: Negative.  Cardiovascular: Negative.  Gastrointestinal: Negative.  Genitourinary: Negative.  Musculoskeletal: Negative.  Skin: Negative.  Neurological: Negative.  Endo/Heme/Allergies: Negative.  Psychiatric/Behavioral: Negative.    Medical History: Past Medical History:  Diagnosis Date  Anemia  Arthritis  GERD (gastroesophageal reflux disease)  History of cancer   Sleep apnea   Patient Active Problem List  Diagnosis  Left thyroid  nodule  Neoplasm of uncertain behavior of thyroid  gland   Past Surgical History:  Procedure Laterality Date  HYSTERECTOMY    Allergies  Allergen Reactions  Nsaids (Non-Steroidal Anti-Inflammatory Drug) Other (See Comments)  Should avoid-- GI bleed  Hydrocodone Nausea And Vomiting and Other (See Comments)   Current Outpatient Medications on File Prior to Visit  Medication Sig Dispense Refill  ascorbic acid, vitamin C, (VITAMIN C) 500 MG tablet Take 500 mg by mouth once daily  chlorthalidone  25 MG tablet  cholecalciferol (VITAMIN D3) 2,000 unit tablet Take 2,000 Units by mouth once daily  ferrous sulfate  325 (65 FE) MG tablet Take 325 mg by mouth daily with breakfast  naproxen sodium (ALEVE) 220 MG tablet Take 220 mg by mouth  omeprazole  (PRILOSEC) 40 MG DR capsule   No current facility-administered medications on file prior to visit.   History reviewed. No pertinent family history.   Social History   Tobacco Use  Smoking Status Never  Smokeless Tobacco Never    Social History   Socioeconomic History  Marital status: Widowed  Tobacco Use  Smoking status: Never  Smokeless tobacco: Never  Vaping Use  Vaping status: Unknown  Substance and Sexual Activity  Alcohol use: Yes  Alcohol/week: 0.0 - 1.0 standard drinks of alcohol  Drug use: Never   Social Drivers of Health   Food Insecurity: No Food Insecurity (06/11/2020)  Received from [facility]  Hunger Vital Sign  Within the past 12 months, you worried that your food would run out before you got the money to buy more.: Never true  Within the past 12 months, the food you bought just didn't last and you didn't have money  to get more.: Never true  Housing Stability: Unknown (03/15/2024)  Housing Stability Vital Sign  Homeless in the Last Year: No   Objective:   Vitals:  BP: (!) 157/84  Pulse: 93  Temp: 36.7 C (98 F)  SpO2: 98%  Weight:  100.7 kg (222 lb)  Height: 157.5 cm (5' 2)  PainSc: 0-No pain   Body mass index is 40.6 kg/m.  Physical Exam   GENERAL APPEARANCE Comfortable, no acute issues Development: normal Gross deformities: none  SKIN Rash, lesions, ulcers: none Induration, erythema: none Nodules: none palpable  EYES Conjunctiva and lids: normal Pupils: equal  EARS, NOSE, MOUTH, THROAT External ears: no lesion or deformity External nose: no lesion or deformity Hearing: grossly normal  NECK Symmetric: yes Trachea: midline Thyroid : Right thyroid  lobe is without palpable abnormality. Left thyroid  lobe has a rounded somewhat firm mobile nodule, evident with swallowing, that extends beneath the left clavicle. There is no associated lymphadenopathy. There is no tenderness.  CHEST/CV Not assessed  ABDOMEN Not assessed  GENITOURINARY/RECTAL Not assessed  MUSCULOSKELETAL Station and gait: normal Digits and nails: no clubbing or cyanosis Muscle strength: grossly normal all extremities Deformity: none  LYMPHATIC Cervical: none palpable Supraclavicular: none palpable  PSYCHIATRIC Oriented to person, place, and time: yes Mood and affect: normal for situation Judgment and insight: appropriate for situation   Assessment and Plan:   Left thyroid  nodule Neoplasm of uncertain behavior of thyroid  gland  Patient is referred by Dr. Garnette Lukes for surgical evaluation and management of a newly diagnosed thyroid  neoplasm of uncertain behavior.  Patient provided with a copy of The Thyroid  Book: Medical and Surgical Treatment of Thyroid  Problems, published by [employer], 16 pages. Book reviewed and explained to patient during visit today.  Today we reviewed her clinical history. We reviewed her recent ultrasound study. We reviewed her cytopathology report as well as her molecular genetic testing. Patient has a 50% risk of malignancy and a solitary 3.4 cm nodule in the left thyroid  lobe. I have  recommended proceeding with left thyroid  lobectomy for definitive diagnosis and management. We discussed the risk and benefits of this procedure. We discussed the risk of recurrent laryngeal nerve injury. We discussed the risk of injury to parathyroid glands. We discussed the hospital stay to be anticipated. We discussed the size and location of the surgical incision. We discussed her postoperative recovery and return to activities. We discussed the potential need for additional surgery. We discussed the potential need for radioactive iodine treatment.  Patient would like to consider surgery in the near future. She does have a family vacation scheduled for August. I told her it would be fine to wait until after her vacation to have her surgical procedure. She will require 1 night in the hospital. She will then require approximately 1 week of time at home to recover from surgery. I will enter orders and asked my schedulers to contact the patient to work on a date for her procedure.   Krystal Spinner, MD [facility] practice Office: [phone]

## 2024-05-09 ENCOUNTER — Ambulatory Visit (HOSPITAL_COMMUNITY)
Admission: RE | Admit: 2024-05-09 | Discharge: 2024-05-10 | Disposition: A | Discharge status: Home / Self Care | Attending: Surgery | Admitting: Surgery

## 2024-05-09 ENCOUNTER — Ambulatory Visit (HOSPITAL_COMMUNITY): Payer: Self-pay | Admitting: Physician Assistant

## 2024-05-09 ENCOUNTER — Other Ambulatory Visit: Payer: Self-pay

## 2024-05-09 ENCOUNTER — Encounter (HOSPITAL_COMMUNITY): Payer: Self-pay | Admitting: Surgery

## 2024-05-09 ENCOUNTER — Encounter (HOSPITAL_COMMUNITY)
Admission: RE | Disposition: A | Discharge status: Home / Self Care | Payer: Self-pay | Source: Home / Self Care | Attending: Surgery

## 2024-05-09 ENCOUNTER — Ambulatory Visit (HOSPITAL_BASED_OUTPATIENT_CLINIC_OR_DEPARTMENT_OTHER): Admitting: Anesthesiology

## 2024-05-09 DIAGNOSIS — D440 Neoplasm of uncertain behavior of thyroid gland: Secondary | ICD-10-CM | POA: Diagnosis present

## 2024-05-09 DIAGNOSIS — K449 Diaphragmatic hernia without obstruction or gangrene: Secondary | ICD-10-CM | POA: Insufficient documentation

## 2024-05-09 DIAGNOSIS — M1990 Unspecified osteoarthritis, unspecified site: Secondary | ICD-10-CM | POA: Diagnosis not present

## 2024-05-09 DIAGNOSIS — I10 Essential (primary) hypertension: Secondary | ICD-10-CM | POA: Insufficient documentation

## 2024-05-09 DIAGNOSIS — E041 Nontoxic single thyroid nodule: Secondary | ICD-10-CM | POA: Diagnosis present

## 2024-05-09 DIAGNOSIS — F419 Anxiety disorder, unspecified: Secondary | ICD-10-CM | POA: Diagnosis not present

## 2024-05-09 DIAGNOSIS — Z01818 Encounter for other preprocedural examination: Secondary | ICD-10-CM

## 2024-05-09 DIAGNOSIS — R0602 Shortness of breath: Secondary | ICD-10-CM | POA: Diagnosis not present

## 2024-05-09 DIAGNOSIS — F32A Depression, unspecified: Secondary | ICD-10-CM | POA: Diagnosis not present

## 2024-05-09 DIAGNOSIS — Z79899 Other long term (current) drug therapy: Secondary | ICD-10-CM | POA: Diagnosis not present

## 2024-05-09 DIAGNOSIS — K219 Gastro-esophageal reflux disease without esophagitis: Secondary | ICD-10-CM | POA: Diagnosis not present

## 2024-05-09 DIAGNOSIS — D649 Anemia, unspecified: Secondary | ICD-10-CM | POA: Insufficient documentation

## 2024-05-09 DIAGNOSIS — D093 Carcinoma in situ of thyroid and other endocrine glands: Secondary | ICD-10-CM | POA: Diagnosis not present

## 2024-05-09 HISTORY — PX: THYROID LOBECTOMY: SHX420

## 2024-05-09 LAB — BASIC METABOLIC PANEL WITH GFR
Anion gap: 15 (ref 5–15)
BUN: 13 mg/dL (ref 8–23)
CO2: 24 mmol/L (ref 22–32)
Calcium: 9 mg/dL (ref 8.9–10.3)
Chloride: 103 mmol/L (ref 98–111)
Creatinine, Ser: 0.82 mg/dL (ref 0.44–1.00)
GFR, Estimated: >60 mL/min (ref >60)
Glucose, Bld: 137 mg/dL — ABNORMAL HIGH (ref 70–99)
Potassium: 3 mmol/L — ABNORMAL LOW (ref 3.5–5.1)
Sodium: 142 mmol/L (ref 135–145)

## 2024-05-09 LAB — GLUCOSE, CAPILLARY: Glucose-Capillary: 127 mg/dL — ABNORMAL HIGH (ref 70–99)

## 2024-05-09 SURGERY — LOBECTOMY, THYROID
Anesthesia: General | Laterality: Left

## 2024-05-09 MED ORDER — ONDANSETRON HCL 4 MG/2ML IJ SOLN
4.0000 mg | Freq: Four times a day (QID) | INTRAMUSCULAR | Status: DC | PRN
  Administered 2024-05-10: 4 mg via INTRAVENOUS
  Filled 2024-05-09: qty 2

## 2024-05-09 MED ORDER — DEXAMETHASONE SODIUM PHOSPHATE 10 MG/ML IJ SOLN
INTRAMUSCULAR | Status: AC
  Filled 2024-05-09: qty 1

## 2024-05-09 MED ORDER — CEFAZOLIN SODIUM-DEXTROSE 2-4 GM/100ML-% IV SOLN
2.0000 g | INTRAVENOUS | Status: AC
  Administered 2024-05-09: 2 g via INTRAVENOUS
  Filled 2024-05-09: qty 100

## 2024-05-09 MED ORDER — ORAL CARE MOUTH RINSE: 15.0000 mL | Freq: Once | OROMUCOSAL | Status: AC

## 2024-05-09 MED ORDER — OXYCODONE HCL 5 MG/5ML PO SOLN: 5.0000 mg | Freq: Once | ORAL | Status: DC | PRN

## 2024-05-09 MED ORDER — METOCLOPRAMIDE HCL 5 MG/ML IJ SOLN
10.0000 mg | Freq: Once | INTRAMUSCULAR | Status: AC
  Administered 2024-05-09: 10 mg via INTRAVENOUS

## 2024-05-09 MED ORDER — PHENYLEPHRINE HCL (PRESSORS) 10 MG/ML IV SOLN
INTRAVENOUS | Status: DC | PRN
  Administered 2024-05-09: 80 ug via INTRAVENOUS

## 2024-05-09 MED ORDER — PROPOFOL 10 MG/ML IV BOLUS
INTRAVENOUS | Status: AC
Start: 2024-05-09 — End: 2024-05-09
  Filled 2024-05-09: qty 20

## 2024-05-09 MED ORDER — EPHEDRINE 5 MG/ML INJ
INTRAVENOUS | Status: AC
  Filled 2024-05-09: qty 5

## 2024-05-09 MED ORDER — HEMOSTATIC AGENTS (NO CHARGE) OPTIME
TOPICAL | Status: DC | PRN
  Administered 2024-05-09: 1 via TOPICAL

## 2024-05-09 MED ORDER — EPHEDRINE SULFATE-NACL 50-0.9 MG/10ML-% IV SOSY
PREFILLED_SYRINGE | INTRAVENOUS | Status: DC | PRN
  Administered 2024-05-09: 10 mg via INTRAVENOUS
  Administered 2024-05-09: 15 mg via INTRAVENOUS

## 2024-05-09 MED ORDER — TRAMADOL HCL 50 MG PO TABS: 50.0000 mg | ORAL_TABLET | Freq: Four times a day (QID) | ORAL | Status: DC | PRN

## 2024-05-09 MED ORDER — PANTOPRAZOLE SODIUM 40 MG PO TBEC
40.0000 mg | DELAYED_RELEASE_TABLET | Freq: Every day | ORAL | Status: DC
Start: 2024-05-10 — End: 2024-05-10

## 2024-05-09 MED ORDER — SODIUM CHLORIDE 0.45 % IV SOLN: INTRAVENOUS | Status: DC

## 2024-05-09 MED ORDER — ACETAMINOPHEN 10 MG/ML IV SOLN: 1000.0000 mg | Freq: Once | INTRAVENOUS | Status: DC | PRN

## 2024-05-09 MED ORDER — ROCURONIUM BROMIDE 10 MG/ML (PF) SYRINGE
PREFILLED_SYRINGE | INTRAVENOUS | Status: DC | PRN
  Administered 2024-05-09: 70 mg via INTRAVENOUS

## 2024-05-09 MED ORDER — ACETAMINOPHEN 325 MG PO TABS: 650.0000 mg | ORAL_TABLET | Freq: Four times a day (QID) | ORAL | Status: DC | PRN

## 2024-05-09 MED ORDER — SUGAMMADEX SODIUM 200 MG/2ML IV SOLN
INTRAVENOUS | Status: DC | PRN
  Administered 2024-05-09: 200 mg via INTRAVENOUS

## 2024-05-09 MED ORDER — PROPOFOL 10 MG/ML IV BOLUS
INTRAVENOUS | Status: DC | PRN
  Administered 2024-05-09: 150 mg via INTRAVENOUS

## 2024-05-09 MED ORDER — ONDANSETRON HCL 4 MG/2ML IJ SOLN
4.0000 mg | Freq: Once | INTRAMUSCULAR | Status: AC | PRN
  Administered 2024-05-09: 4 mg via INTRAVENOUS

## 2024-05-09 MED ORDER — DEXAMETHASONE SODIUM PHOSPHATE 10 MG/ML IJ SOLN
INTRAMUSCULAR | Status: DC | PRN
  Administered 2024-05-09: 10 mg via INTRAVENOUS

## 2024-05-09 MED ORDER — ACETAMINOPHEN 650 MG RE SUPP: 650.0000 mg | Freq: Four times a day (QID) | RECTAL | Status: DC | PRN

## 2024-05-09 MED ORDER — FENTANYL CITRATE (PF) 100 MCG/2ML IJ SOLN
INTRAMUSCULAR | Status: AC
  Filled 2024-05-09: qty 2

## 2024-05-09 MED ORDER — CHLORHEXIDINE GLUCONATE 0.12 % MT SOLN
15.0000 mL | Freq: Once | OROMUCOSAL | Status: AC
  Administered 2024-05-09: 15 mL via OROMUCOSAL

## 2024-05-09 MED ORDER — OXYCODONE HCL 5 MG PO TABS
5.0000 mg | ORAL_TABLET | ORAL | Status: DC | PRN
  Administered 2024-05-09: 10 mg via ORAL
  Filled 2024-05-09: qty 2

## 2024-05-09 MED ORDER — LIDOCAINE HCL (PF) 2 % IJ SOLN
INTRAMUSCULAR | Status: AC
Start: 2024-05-09 — End: 2024-05-09
  Filled 2024-05-09: qty 5

## 2024-05-09 MED ORDER — LACTATED RINGERS IV SOLN: INTRAVENOUS | Status: DC

## 2024-05-09 MED ORDER — FENTANYL CITRATE (PF) 100 MCG/2ML IJ SOLN
INTRAMUSCULAR | Status: DC | PRN
  Administered 2024-05-09: 50 ug via INTRAVENOUS
  Administered 2024-05-09: 100 ug via INTRAVENOUS

## 2024-05-09 MED ORDER — HYDROMORPHONE HCL 1 MG/ML IJ SOLN: 1.0000 mg | INTRAMUSCULAR | Status: DC | PRN

## 2024-05-09 MED ORDER — ONDANSETRON 4 MG PO TBDP: 4.0000 mg | ORAL_TABLET | Freq: Four times a day (QID) | ORAL | Status: DC | PRN

## 2024-05-09 MED ORDER — CHLORTHALIDONE 25 MG PO TABS
25.0000 mg | ORAL_TABLET | Freq: Every day | ORAL | Status: DC
  Administered 2024-05-09: 25 mg via ORAL
  Filled 2024-05-09: qty 1

## 2024-05-09 MED ORDER — ONDANSETRON HCL 4 MG/2ML IJ SOLN
INTRAMUSCULAR | Status: AC
  Filled 2024-05-09: qty 2

## 2024-05-09 MED ORDER — PHENYLEPHRINE 80 MCG/ML (10ML) SYRINGE FOR IV PUSH (FOR BLOOD PRESSURE SUPPORT)
PREFILLED_SYRINGE | INTRAVENOUS | Status: DC | PRN
  Administered 2024-05-09 (×2): 80 ug via INTRAVENOUS
  Administered 2024-05-09: 160 ug via INTRAVENOUS

## 2024-05-09 MED ORDER — 0.9 % SODIUM CHLORIDE (POUR BTL) OPTIME
TOPICAL | Status: DC | PRN
  Administered 2024-05-09: 1000 mL

## 2024-05-09 MED ORDER — FENTANYL CITRATE PF 50 MCG/ML IJ SOSY: 25.0000 ug | PREFILLED_SYRINGE | INTRAMUSCULAR | Status: DC | PRN

## 2024-05-09 MED ORDER — ROCURONIUM BROMIDE 10 MG/ML (PF) SYRINGE
PREFILLED_SYRINGE | INTRAVENOUS | Status: AC
  Filled 2024-05-09: qty 10

## 2024-05-09 MED ORDER — METOCLOPRAMIDE HCL 5 MG/ML IJ SOLN
INTRAMUSCULAR | Status: AC
  Filled 2024-05-09: qty 2

## 2024-05-09 MED ORDER — OXYCODONE HCL 5 MG PO TABS: 5.0000 mg | ORAL_TABLET | Freq: Once | ORAL | Status: DC | PRN

## 2024-05-09 MED ORDER — LIDOCAINE HCL (PF) 2 % IJ SOLN
INTRAMUSCULAR | Status: DC | PRN
  Administered 2024-05-09: 100 mg via INTRADERMAL

## 2024-05-09 MED ORDER — CHLORHEXIDINE GLUCONATE CLOTH 2 % EX PADS: 6.0000 | MEDICATED_PAD | Freq: Once | CUTANEOUS | Status: DC

## 2024-05-09 MED ORDER — ONDANSETRON HCL 4 MG/2ML IJ SOLN
INTRAMUSCULAR | Status: DC | PRN
  Administered 2024-05-09: 4 mg via INTRAVENOUS

## 2024-05-09 SURGICAL SUPPLY — 27 items
BAG COUNTER SPONGE SURGICOUNT (BAG) ×2 IMPLANT
BLADE SURG 15 STRL LF DISP TIS (BLADE) ×2 IMPLANT
CHLORAPREP W/TINT 26 (MISCELLANEOUS) ×2 IMPLANT
CLIP TI MEDIUM 6 (CLIP) ×4 IMPLANT
CLIP TI WIDE RED SMALL 6 (CLIP) ×4 IMPLANT
COVER SURGICAL LIGHT HANDLE (MISCELLANEOUS) ×2 IMPLANT
DERMABOND ADVANCED .7 DNX12 (GAUZE/BANDAGES/DRESSINGS) ×2 IMPLANT
DRAPE LAPAROTOMY T 98X78 PEDS (DRAPES) ×2 IMPLANT
DRAPE UTILITY XL STRL (DRAPES) ×2 IMPLANT
ELECT PENCIL ROCKER SW 15FT (MISCELLANEOUS) ×2 IMPLANT
ELECT REM PT RETURN 15FT ADLT (MISCELLANEOUS) ×2 IMPLANT
GAUZE 4X4 16PLY ~~LOC~~+RFID DBL (SPONGE) ×2 IMPLANT
GLOVE SURG ORTHO 8.0 STRL STRW (GLOVE) ×2 IMPLANT
GOWN STRL REUS W/ TWL XL LVL3 (GOWN DISPOSABLE) ×4 IMPLANT
HEMOSTAT SURGICEL 2X4 FIBR (HEMOSTASIS) ×2 IMPLANT
ILLUMINATOR WAVEGUIDE N/F (MISCELLANEOUS) ×2 IMPLANT
KIT BASIN OR (CUSTOM PROCEDURE TRAY) ×2 IMPLANT
KIT TURNOVER KIT A (KITS) ×2 IMPLANT
PACK BASIC VI WITH GOWN DISP (CUSTOM PROCEDURE TRAY) ×2 IMPLANT
PAD MAGNETIC INSTR ST 16X20 (MISCELLANEOUS) ×2 IMPLANT
SHEARS HARMONIC 9CM CVD (BLADE) ×2 IMPLANT
SUT MNCRL AB 4-0 PS2 18 (SUTURE) ×2 IMPLANT
SUT SILK 3 0 SH 30 (SUTURE) ×2 IMPLANT
SUT VIC AB 3-0 SH 18 (SUTURE) ×4 IMPLANT
SYR BULB IRRIG 60ML STRL (SYRINGE) ×2 IMPLANT
TOWEL OR 17X26 10 PK STRL BLUE (TOWEL DISPOSABLE) ×2 IMPLANT
TUBING CONNECTING 10 (TUBING) ×2 IMPLANT

## 2024-05-09 NOTE — Plan of Care (Signed)
  Problem: Activity: Goal: Risk for activity intolerance will decrease Outcome: Progressing   Problem: Nutrition: Goal: Adequate nutrition will be maintained Outcome: Adequate for Discharge   Problem: Elimination: Goal: Will not experience complications related to urinary retention Outcome: Adequate for Discharge

## 2024-05-09 NOTE — Telephone Encounter (Signed)
 Patient had pre-admission testing at [facility] on 05/06/2024 and based on that note, they saw Dr. Margurite below note about her being at acceptable risk for surgery. Therefore, we were not sent a formal pre-op clearance. She is scheduled for surgery this afternoon. Will remove phone note from pre-op pool.  Shelly Sandhu E Braylynn Lewing, PA-C 05/09/2024 8:06 AM

## 2024-05-09 NOTE — Anesthesia Procedure Notes (Signed)
 Procedure Name: Intubation Date/Time: 05/09/2024 12:40 PM  Performed by: Carleton Garnette SAUNDERS, CRNAPre-anesthesia Checklist: Patient identified, Emergency Drugs available, Suction available, Patient being monitored and Timeout performed Patient Re-evaluated:Patient Re-evaluated prior to induction Oxygen Delivery Method: Circle system utilized Preoxygenation: Pre-oxygenation with 100% oxygen Induction Type: IV induction Ventilation: Mask ventilation without difficulty Laryngoscope Size: Mac and 4 Grade View: Grade I Tube type: Oral Tube size: 7.0 mm Number of attempts: 1 Airway Equipment and Method: Stylet Placement Confirmation: ETT inserted through vocal cords under direct vision, positive ETCO2 and breath sounds checked- equal and bilateral Secured at: 22 cm Tube secured with: Tape Dental Injury: Teeth and Oropharynx as per pre-operative assessment

## 2024-05-09 NOTE — Anesthesia Preprocedure Evaluation (Addendum)
 Anesthesia Evaluation  Patient identified by MRN, date of birth, ID band Patient awake    Reviewed: Allergy & Precautions, NPO status , Patient's Chart, lab work & pertinent test results, reviewed documented beta blocker date and time   History of Anesthesia Complications Negative for: history of anesthetic complications  Airway Mallampati: II  TM Distance: >3 FB     Dental no notable dental hx.    Pulmonary shortness of breath, sleep apnea and Continuous Positive Airway Pressure Ventilation , pneumonia, Not current smoker, neg PE   breath sounds clear to auscultation       Cardiovascular hypertension, (-) CAD, (-) Past MI, (-) Cardiac Stents and (-) CABG  Rhythm:Regular Rate:Normal     Neuro/Psych neg Seizures PSYCHIATRIC DISORDERS Anxiety Depression     Neuromuscular disease    GI/Hepatic hiatal hernia,GERD  ,,(+) neg Cirrhosis        Endo/Other    Renal/GU Renal disease     Musculoskeletal  (+) Arthritis ,    Abdominal   Peds  Hematology  (+) Blood dyscrasia, anemia   Anesthesia Other Findings   Reproductive/Obstetrics                              Anesthesia Physical Anesthesia Plan  ASA: 2  Anesthesia Plan: General   Post-op Pain Management:    Induction: Intravenous  PONV Risk Score and Plan: 2 and Ondansetron  and Dexamethasone   Airway Management Planned: Oral ETT  Additional Equipment:   Intra-op Plan:   Post-operative Plan: Extubation in OR  Informed Consent: I have reviewed the patients History and Physical, chart, labs and discussed the procedure including the risks, benefits and alternatives for the proposed anesthesia with the patient or authorized representative who has indicated his/her understanding and acceptance.     Dental advisory given  Plan Discussed with: CRNA  Anesthesia Plan Comments:          Anesthesia Quick Evaluation

## 2024-05-09 NOTE — Interval H&P Note (Signed)
 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  The various methods of treatment have been discussed with the patient and family. After consideration of risks, benefits and other options for treatment, the patient has consented to    Procedure(s) with comments: LOBECTOMY, THYROID  (Left) - LEFT THYROID  LOBECTOMY as a surgical intervention.    The patient's history has been reviewed, patient examined, no change in status, stable for surgery.  I have reviewed the patient's chart and labs.  Questions were answered to the patient's satisfaction.    Krystal Spinner, MD [facility] practice Office: [phone]   Krystal Spinner

## 2024-05-09 NOTE — Transfer of Care (Signed)
 Immediate Anesthesia Transfer of Care Note  Patient: Shelly Johnson  Procedure(s) Performed: LOBECTOMY, THYROID  (Left)  Patient Location: PACU  Anesthesia Type:General  Level of Consciousness: sedated  Airway & Oxygen Therapy: Patient Spontanous Breathing and Patient connected to face mask oxygen  Post-op Assessment: Report given to RN and Post -op Vital signs reviewed and stable  Post vital signs: Reviewed and stable  Last Vitals:  Vitals Value Taken Time  BP 166/96 05/09/24 14:00  Temp    Pulse 96 05/09/24 14:01  Resp 18 05/09/24 14:01  SpO2 92 % 05/09/24 14:01  Vitals shown include unfiled device data.  Last Pain:  Vitals:   05/09/24 1052  TempSrc: Oral  PainSc:          Complications: No notable events documented.

## 2024-05-09 NOTE — Op Note (Signed)
 Procedure Note  Pre-operative Diagnosis:  Left thyroid  nodule, thyroid  neoplasm of uncertain behavior  Post-operative Diagnosis:  same  Surgeon:  Krystal Spinner, MD  Assistant:  none   Procedure:  Left thyroid  lobectomy and isthmusectomy  Anesthesia:  General  Estimated Blood Loss:  20 cc  Drains: none         Specimen: thyroid  lobe to pathology  Indications:  Patient is referred by Dr. Garnette Lukes for surgical evaluation and management of a newly diagnosed thyroid  neoplasm of uncertain behavior. Patient had undergone a chest CT in November 2024. Incidental finding was made of a left thyroid  nodule. Patient underwent an ultrasound examination of the neck in May 2025. This demonstrated a solitary nodule in the mid left thyroid  this was felt to be mildly suspicious and fine-needle aspiration biopsy was recommended. Biopsy was performed on February 03, 2024. This demonstrated atypia of undetermined significance, Bethesda category III. The sample was submitted for molecular genetic testing with AFIRMA. This returned with a result of suspicious, rendering a risk of malignancy of 50%. The patient is now referred to surgery for consideration for resection for definitive diagnosis and management.   Procedure Details: Procedure was done in OR #1 at the [facility]. The patient was brought to the operating room and placed in a supine position on the operating room table. Following administration of general anesthesia, the patient was positioned and then prepped and draped in the usual aseptic fashion. After ascertaining that an adequate level of anesthesia had been achieved, a small Kocher incision was made with #15 blade. Dissection was carried through subcutaneous tissues and platysma. Hemostasis was achieved with the electrocautery. Skin flaps were elevated cephalad and caudad from the thyroid  notch to the sternal notch. A self-retaining retractor was placed for exposure. Strap muscles were  incised in the midline and dissection was begun on the left side. Strap muscles were reflected laterally. The left thyroid  lobe was mildly enlarged with a dominant nodule occupying most of the lobe. The lobe was gently mobilized with blunt dissection. Superior pole vessels were dissected out and divided individually between small and medium ligaclips with the harmonic scalpel. The thyroid  lobe was rolled anteriorly. Branches of the inferior thyroid  artery were divided between small ligaclips with the harmonic scalpel. Inferior venous tributaries were divided between ligaclips. Both the superior and inferior parathyroid glands were identified and preserved on their vascular pedicles. The recurrent laryngeal nerve was identified and preserved along its course. The ligament of Court was released with the electrocautery and the gland was mobilized onto the anterior trachea. Isthmus was mobilized across the midline. There was a small pyramidal lobe present which was resected with the isthmus. The thyroid  parenchyma was transected at the junction of the isthmus and contralateral thyroid  lobe with the harmonic scalpel. A suture was used to mark the isthmus margin. The thyroid  lobe and isthmus were submitted to pathology for review.  The entire field was palpated for evidence of lymphadenopathy or extra-thyroidal disease.  No worrisome findings were noted.  No enlarged lymph nodes were identified.  The neck was irrigated with warm saline. Fibrillar was placed throughout the operative field. Strap muscles were approximated in the midline with interrupted 3-0 Vicryl sutures. Platysma was closed with interrupted 3-0 Vicryl sutures. Skin was closed with a running 4-0 Monocryl subcuticular suture.  Wound was washed and dried and Dermabond was applied. The patient was awakened from anesthesia and brought to the recovery room. The patient tolerated the procedure well.  Krystal Spinner, MD [facility] Office:  [phone]

## 2024-05-09 NOTE — Anesthesia Postprocedure Evaluation (Signed)
 Anesthesia Post Note  Patient: Shelly Johnson  Procedure(s) Performed: LOBECTOMY, THYROID  (Left)     Patient location during evaluation: PACU Anesthesia Type: General Level of consciousness: awake and alert Pain management: pain level controlled Vital Signs Assessment: post-procedure vital signs reviewed and stable Respiratory status: spontaneous breathing, nonlabored ventilation, respiratory function stable and patient connected to nasal cannula oxygen Cardiovascular status: blood pressure returned to baseline and stable Postop Assessment: no apparent nausea or vomiting Anesthetic complications: yes (prolonged emergence) Comments: Prolonged emergence from general anesthesia noted. Glucose, BMP obtained and reviewed, no significant abnormalities. Initially minimally responsive but with time progressed to following simple commands and then eventually to oriented to person, place, year. Hemodynamically stable throughout PACU stay.    No notable events documented.  Last Vitals:  Vitals:   05/09/24 1545 05/09/24 1552  BP: (!) 162/82   Pulse: 81 79  Resp: 14 14  Temp:    SpO2: 98% 97%    Last Pain:  Vitals:   05/09/24 1545  TempSrc:   PainSc: 0-No pain                 Lynwood MARLA Cornea

## 2024-05-10 ENCOUNTER — Encounter (HOSPITAL_COMMUNITY): Payer: Self-pay | Admitting: Surgery

## 2024-05-10 DIAGNOSIS — K449 Diaphragmatic hernia without obstruction or gangrene: Secondary | ICD-10-CM | POA: Diagnosis not present

## 2024-05-10 DIAGNOSIS — K219 Gastro-esophageal reflux disease without esophagitis: Secondary | ICD-10-CM | POA: Diagnosis not present

## 2024-05-10 DIAGNOSIS — D440 Neoplasm of uncertain behavior of thyroid gland: Secondary | ICD-10-CM | POA: Diagnosis not present

## 2024-05-10 DIAGNOSIS — F419 Anxiety disorder, unspecified: Secondary | ICD-10-CM | POA: Diagnosis not present

## 2024-05-10 DIAGNOSIS — I10 Essential (primary) hypertension: Secondary | ICD-10-CM | POA: Diagnosis not present

## 2024-05-10 DIAGNOSIS — R0602 Shortness of breath: Secondary | ICD-10-CM | POA: Diagnosis not present

## 2024-05-10 NOTE — Discharge Instructions (Signed)

## 2024-05-10 NOTE — Discharge Summary (Signed)
    Physician Discharge Summary   Patient ID: Shelly Johnson MRN: [id number] DOB/AGE: Nov 02, 1945 78 y.o.  Admit date: 05/09/2024  Discharge date: 05/10/2024  Discharge Diagnoses:  Principal Problem:   Neoplasm of uncertain behavior of thyroid  gland Active Problems:   Left thyroid  nodule   Discharged Condition: good  Hospital Course: Patient was admitted for observation following left thyroid  lobectomy.  Post op course was uncomplicated.  Pain was well controlled.  Tolerated diet.  Patient was prepared for discharge home on POD#1.  Consults: None  Treatments: surgery: left thyroid  lobectomy  Discharge Exam: Blood pressure 116/73, pulse 82, temperature 98 F (36.7 C), resp. rate 18, height 5' 2 (1.575 m), weight 97.1 kg, SpO2 94%. HEENT - clear Neck - wound dry and intact; mild STS; voice normal  Disposition: Home  Discharge Instructions     Diet - low sodium heart healthy   Complete by: As directed    Increase activity slowly   Complete by: As directed    No dressing needed   Complete by: As directed       Allergies as of 05/10/2024       Reactions   Nsaids    Should avoid-- GI bleed   Hydrocodone Nausea And Vomiting        Medication List     TAKE these medications    albuterol  108 (90 Base) MCG/ACT inhaler Commonly known as: VENTOLIN  HFA Inhale 2 puffs into the lungs every 6 (six) hours as needed for wheezing or shortness of breath. Use 30 minutes prior to activity.   ascorbic acid 500 MG tablet Commonly known as: VITAMIN C Take 500 mg by mouth daily.   aspirin EC 81 MG tablet Take 81 mg by mouth 2 (two) times a week. Swallow whole.   carboxymethylcellulose 0.5 % Soln Commonly known as: REFRESH PLUS Place 1 drop into both eyes 2 (two) times daily as needed (eye irritation).   chlorthalidone  25 MG tablet Commonly known as: HYGROTON  TAKE 1 TABLET BY MOUTH DAILY   ferrous sulfate  325 (65 FE) MG tablet Take 1 tablet (325 mg total) by mouth 2  (two) times daily. What changed: when to take this   naproxen sodium 220 MG tablet Commonly known as: ALEVE Take 220 mg by mouth 2 (two) times daily as needed (pain).   NON FORMULARY Pt uses a cpap nightly   omeprazole  40 MG capsule Commonly known as: PRILOSEC TAKE 1 CAPSULE BY MOUTH DAILY   VITAMIN D  PO Take 1 tablet by mouth daily.   Voltaren Arthritis Pain 1 % Gel Generic drug: diclofenac Sodium Apply 2 g topically daily as needed (knee pain).               Discharge Care Instructions  (From admission, onward)           Start     Ordered   05/10/24 0000  No dressing needed        05/10/24 9095            Follow-up Information     Eletha Boas, MD. Schedule an appointment as soon as possible for a visit in 3 week(s).   Specialty: General Surgery Why: For wound re-check Contact information: [address] [place] [address] [phone]                 Boas Eletha, MD [facility] Office: [phone]   Signed: Boas Eletha 05/10/2024, 9:05 AM

## 2024-05-12 ENCOUNTER — Ambulatory Visit: Payer: Self-pay | Admitting: Surgery

## 2024-05-12 LAB — SURGICAL PATHOLOGY

## 2024-05-12 NOTE — Progress Notes (Signed)
 Good news!  This is a tumor that behaves in a benign fashion, and only requires complete resection.  Yours is completely resected.  No further surgery is necessary and no further treatment is necessary.  We will go over the written path report at your post op visit.  Krystal Spinner, MD [facility] practice Office: [phone]

## 2024-05-13 DIAGNOSIS — H52203 Unspecified astigmatism, bilateral: Secondary | ICD-10-CM | POA: Diagnosis not present

## 2024-05-13 DIAGNOSIS — H2513 Age-related nuclear cataract, bilateral: Secondary | ICD-10-CM | POA: Diagnosis not present

## 2024-05-13 DIAGNOSIS — H1789 Other corneal scars and opacities: Secondary | ICD-10-CM | POA: Diagnosis not present

## 2024-05-16 ENCOUNTER — Ambulatory Visit (INDEPENDENT_AMBULATORY_CARE_PROVIDER_SITE_OTHER): Admitting: Primary Care

## 2024-05-16 ENCOUNTER — Encounter: Payer: Self-pay | Admitting: Primary Care

## 2024-05-16 VITALS — BP 132/78 | HR 95 | Temp 97.7°F | Ht 62.0 in | Wt 222.8 lb

## 2024-05-16 DIAGNOSIS — G4733 Obstructive sleep apnea (adult) (pediatric): Secondary | ICD-10-CM | POA: Diagnosis not present

## 2024-05-16 DIAGNOSIS — R0602 Shortness of breath: Secondary | ICD-10-CM

## 2024-05-16 NOTE — Progress Notes (Signed)
 @Patient  ID: Shelly Johnson, female    DOB: August 02, 1946, 78 y.o.   MRN: [id number]  Chief Complaint  Patient presents with   Obstructive Sleep Apnea    CPAP f/u.     Referring provider: Katrinka Garnette KIDD, MD  HPI: 78 year old female, never smoked.  Past medical history significant for hypertension, hiatal hernia, hyperlipidemia, hyperglycemia, vitamin D  deficiency,  anemia, morbid obesity.  Previous [facility] pulmonary encounter: 12/09/2022 Patient presents today for sleep consult. She has symptoms of daytime sleepiness/fatigue. Needs to rest her eyes or take a nap some days. She does think that she snores, she can hear herself when she doses off at times. She goes to bed 11pm-12:30am. She does not always go to bed at the same time. It does not take her long to fall asleep. She gets on average 5-8 hours of sleep a night. She wakes up 0-3 times a night to use the restroom. No concern for narcolepsy, cataplexy or sleep walking.   She experiences shortness of breath walking up stairs or walking to the mailbox. She has been under a lot of stress recently. Weight can fluctuate 5 lbs. She was walking every other day up to a mile until recently due to neuropathy. Denies chest tightness or wheezing. No hx childhood asthma. Never smoked. She had CXR in August 2022 showed large hiatal hernia. She has seen GI in the past for dysphagia, taking reflux medication daily and symptoms improved. Echocardiogram in 2018 showed grade 1 diastolic dysfunction. No leg swelling.   Sleep questionnaire Symptoms- snoring, daytime fatigue  Prior sleep study- none  Bedtime- 11pm-12:30am  Time to fall asleep- not long  Nocturnal awakenings- 0-3 times Out of bed/start of day- 6-6:30am to 7-7:30am  Weight changes- not much  Do you operate heavy machinery- no Do you currently wear CPAP- no Do you current wear oxygen- no Epworth- 3    04/15/2023 Patient presents today to review sleep study results.  Seen for sleep  consult in April due to snoring symptoms and daytime fatigue. Epworth score 3. Home sleep study on 03/07/2023 showed severe obstructive sleep apnea, AHI 34.6/h with SpO2 low 71% (average 87%).  We reviewed sleep study results today.  Recommending patient be started on CPAP due to severity of her OSA and hypoxemia.  Patient somewhat reluctant and initially wanted to try oral appliance, however, after encouraging she consider CPAP she is open to trying.  Her husband had CPAP and did not tolerate well, she tells me that the machine was large and bulky.  She was more agreeing to CPAP trial after showing her several different mask types and size of CPAP machine.    06/10/2023 Patient presents today for 8 week follow-up OSA. She has been 100% compliant with CPAP use over the last 30 days greater than 4 hours.  Average usage 7 hours 32 minutes.  Current pressure 5 to 15 cm H2O.  She is still getting used to wearing CPAP mask and is not particularly fond of wearing it. She has no trouble falling or staying asleep.  She reports worsening shortness of breath.  She was noted to have a large hiatal hernia on CTA imaging back in August 2024.  She did have a few scattered tree-in-bud nodularity opacities right lower lobe likely infectious for inflammatory.  She saw GI yesterday and was ordered for CT abdomen.  Shortness of breath is limiting.  She has no associated URI symptoms.  No cough, chest tightness or wheezing.  Oxygen level normal.  Airview download 05/11/2023 - 06/09/2023 Average usage 30/30 days (100%) Average usage 7 hours 32 minutes Pressure 5 to 15 cm H2O (13.9 L/min - 95%) Air leaks 20.9 L/min (95%) AHI 2.8   05/16/2024- Interim hx  Discussed the use of AI scribe software for clinical note transcription with the patient, who gave verbal consent to proceed. History of Present Illness Shelly Johnson is a 78 year old female who presents with concerns about CPAP usage and post-thyroid  surgery  voice changes.  She underwent thyroid  surgery last Monday and has since experienced difficulty speaking. She describes her voice as sounding 'horrible' and is concerned about the duration of these changes. Additionally, she has stomach discomfort and pressure from her jaw through her head, which she attributes to the surgery. She has contacted her surgeon's office for further guidance.  She has a history of severe obstructive sleep apnea, diagnosed in July 2024, with a sleep study showing an average of 34.6 apneic events per hour and a lowest oxygen saturation of 71%. She has been using a CPAP machine since the summer of 2024, although she finds it uncomfortable and dislikes the marks it leaves on her face. She did not use the CPAP during a two-week visit to her children and did not notice a significant difference in her symptoms during that time.  She is frustrated with insurance coverage issues related to her CPAP supplies and is unsure about the need for follow-up appointments to maintain coverage. She is not currently on any weight loss medications and expresses a dislike for injections. Her BMI is 40, and she acknowledges the potential benefits of weight loss for her sleep apnea.  She reports shortness of breath but has not needed to use her albuterol  inhaler, which was prescribed for emergency use. She is also scheduled for cataract surgery and has concerns about her overall health, feeling that her body is 'falling apart'.      Allergies  Allergen Reactions   Nsaids     Should avoid-- GI bleed   Hydrocodone Nausea And Vomiting    Immunization History  Administered Date(s) Administered   Fluad Quad(high Dose 65+) 06/04/2020, 05/30/2021, 06/25/2022   INFLUENZA, HIGH DOSE SEASONAL PF 07/04/2016, 06/19/2017, 04/28/2018   Influenza,inj,Quad PF,6+ Mos 07/18/2013, 04/25/2014   Janssen (J&J) SARS-COV-2 Vaccination 02/09/2020   Pneumococcal Conjugate-13 07/18/2013, 04/03/2015   Pneumococcal  Polysaccharide-23 07/04/2016   Tdap 07/21/2013    Past Medical History:  Diagnosis Date   Anemia    Anxiety and depression    during loss of husband   Arthritis    Cancer (HCC)    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 Swallow whole.     carboxymethylcellulose (REFRESH PLUS) 0.5 % SOLN Place 1 drop into both eyes 2 (two) times daily as needed (eye  irritation).     chlorthalidone  (HYGROTON ) 25 MG tablet TAKE 1 TABLET BY MOUTH DAILY 90 tablet 2   diclofenac Sodium (VOLTAREN ARTHRITIS PAIN) 1 % GEL Apply 2 g topically daily as needed (knee pain).     ferrous sulfate  325 (65 FE) MG tablet Take 1 tablet (325 mg total) by mouth 2 (two) times daily. (Patient taking differently: Take 325 mg by mouth daily.) 60 tablet 3   naproxen sodium (ALEVE) 220 MG tablet Take 220 mg by mouth 2 (two) times daily as needed (pain).     NON FORMULARY Pt uses a cpap nightly     omeprazole  (PRILOSEC) 40 MG capsule TAKE 1 CAPSULE BY MOUTH DAILY 90 capsule 2   vitamin C (ASCORBIC ACID) 500 MG tablet Take 500 mg by mouth daily.       VITAMIN D  PO Take 1 tablet by mouth daily.     albuterol  (VENTOLIN  HFA) 108 (90 Base) MCG/ACT inhaler Inhale 2 puffs into the lungs every 6 (six) hours as needed for wheezing or shortness of breath. Use 30 minutes prior to activity. (Patient not taking: Reported on 05/16/2024) 8 g 2   No facility-administered medications prior to visit.      Review  of Systems  Review of Systems  Constitutional:  Negative for fever.  HENT:  Positive for sinus pressure and voice change. Negative for trouble swallowing.   Respiratory: Negative.  Negative for shortness of breath.    Physical Exam  BP 132/78   Pulse 95   Temp 97.7 F (36.5 C)   Ht 5' 2 (1.575 m)   Wt 222 lb 12.8 oz (101.1 kg)   SpO2 95%   BMI 40.75 kg/m  Physical Exam Constitutional:      Appearance: Normal appearance. She is well-developed.  HENT:     Head: Normocephalic and atraumatic.     Mouth/Throat:     Mouth: Mucous membranes are moist.     Pharynx: Oropharynx is clear.  Eyes:     Pupils: Pupils are equal, round, and reactive to light.  Neck:     Comments: Neck inc with dermabond CDI without signs of infection  Cardiovascular:     Rate and Rhythm: Normal rate and regular rhythm.     Heart sounds: Normal heart sounds. No murmur heard. Pulmonary:     Effort: Pulmonary effort is normal. No respiratory distress.     Breath sounds: Normal breath sounds. No wheezing or rhonchi.  Abdominal:     General: Bowel sounds are normal.     Palpations: Abdomen is soft.     Tenderness: There is no abdominal tenderness.  Musculoskeletal:        General: Normal range of motion.     Cervical back: Normal range of motion and neck supple.  Skin:    General: Skin is warm and dry.     Findings: No erythema or rash.  Neurological:     General: No focal deficit present.     Mental Status: She is alert and oriented to person, place, and time. Mental status is at baseline.  Psychiatric:        Mood and Affect: Mood normal.        Behavior: Behavior normal.        Thought Content: Thought content normal.        Judgment: Judgment normal.      Lab Results:  CBC    Component Value Date/Time   WBC 6.2 05/06/2024 1105   RBC  5.22 (H) 05/06/2024 1105   HGB 15.0 05/06/2024 1105   HCT 48.5 (H) 05/06/2024 1105   PLT 194 05/06/2024 1105   MCV 92.9 05/06/2024 1105   MCH 28.7  05/06/2024 1105   MCHC 30.9 05/06/2024 1105   RDW 13.7 05/06/2024 1105   LYMPHSABS 1.3 03/19/2023 1655   MONOABS 0.5 03/19/2023 1655   EOSABS 0.0 03/19/2023 1655   BASOSABS 0.0 03/19/2023 1655    BMET    Component Value Date/Time   NA 142 05/09/2024 1509   K 3.0 (L) 05/09/2024 1509   CL 103 05/09/2024 1509   CO2 24 05/09/2024 1509   GLUCOSE 137 (H) 05/09/2024 1509   BUN 13 05/09/2024 1509   CREATININE 0.82 05/09/2024 1509   CREATININE 0.86 06/04/2020 1538   CALCIUM 9.0 05/09/2024 1509   GFRNONAA >60 05/09/2024 1509   GFRNONAA 67 06/04/2020 1538   GFRAA 78 06/04/2020 1538    BNP    Component Value Date/Time   BNP 45.4 03/19/2023 1655    ProBNP No results found for: PROBNP  Imaging: No results found.   Assessment & Plan:   1. OSA (obstructive sleep apnea) (Primary) - Ambulatory Referral for DME  Assessment and Plan Assessment & Plan Severe obstructive sleep apnea Severe obstructive sleep apnea diagnosed in July 2024 with an average of 34.6 apneic events per hour and lowest oxygen saturation of 71%. Currently using CPAP since summer 2024, which controls the sleep apnea when used consistently. CPAP is the gold standard treatment for severe sleep apnea, reducing risks of cardiac arrhythmias, pulmonary hypertension, diabetes, stroke, and potentially Alzheimer's. She finds CPAP uncomfortable and aesthetically displeasing, impacting her morning routine. Discussed alternative treatment options including Inspire and weight loss could potentially reduce severity of sleep apnea. She would like to hold off on surgical treatment options at this time and will discuss weight loss options in the future with her primary care provider.  - Continue CPAP therapy - Renew CPAP supplies prescription - Consider Inspire implant if CPAP becomes intolerable - Discuss weight loss options with primary care - Consider referral to sleep lab for mask fitting if current mask is  uncomfortable  Obesity with BMI 40 Obesity with BMI 40, contributing to severe obstructive sleep apnea. Discussed potential benefits of weight loss in reducing sleep apnea severity. Introduced Zepbound, an FDA-approved weight loss medication for obesity and sleep apnea, as a potential option. Discussed insurance coverage variability for Zepbound and alternative weight loss strategies including phentermine and lifestyle modifications. She would like to hold off on starting any weight loss medication at this time and plans to speak with her primary care about her options in the future  - Discuss weight loss strategies with primary care - Consider phentermine as an alternative if GLP medication not covered    Almarie LELON Ferrari, NP 05/16/2024

## 2024-05-16 NOTE — Patient Instructions (Signed)
  VISIT SUMMARY: Today, we discussed your concerns about CPAP usage and post-thyroid  surgery voice changes. You mentioned difficulty speaking and discomfort since your thyroid  surgery last week. We also reviewed your history of severe obstructive sleep apnea and your experiences with CPAP therapy. Additionally, we talked about your shortness of breath and overall health concerns.  YOUR PLAN: -SEVERE OBSTRUCTIVE SLEEP APNEA: Severe obstructive sleep apnea is a condition where your breathing repeatedly stops and starts during sleep. It can lead to serious health issues if untreated. You should continue using your CPAP machine as it helps control your sleep apnea. We renewed your CPAP supplies prescription. If CPAP becomes intolerable, we can consider the [facility] implant, though it is less effective. Weight loss could also help reduce the severity of your sleep apnea. We may refer you to a sleep lab for a more comfortable mask fitting if needed.  -OBESITY WITH BMI 40: Obesity is a condition where you have an excessive amount of body fat, which can contribute to health problems like sleep apnea. We discussed the potential benefits of weight loss for your sleep apnea. We introduced Zepbound, a weight loss medication, as a potential option and discussed its insurance coverage. We also talked about alternative weight loss strategies, including phentermine and lifestyle changes. You should discuss these options with your primary care doctor.  -SHORTNESS OF BREATH: Shortness of breath can be related to untreated sleep apnea and obesity. Your lungs sounded clear during the examination. You should use your albuterol  inhaler as needed for emergencies.  INSTRUCTIONS: Please continue using your CPAP machine and follow up with your primary care doctor to discuss weight loss options and the potential use of Zepbound or phentermine. If you find your current CPAP mask uncomfortable, consider a referral to a sleep lab for a  fitting. Use your albuterol  inhaler as needed for shortness of breath emergencies.   Follow-up 1 year with Beth NP for CPAP renewal

## 2024-05-21 ENCOUNTER — Other Ambulatory Visit: Payer: Self-pay | Admitting: Family Medicine

## 2024-05-31 ENCOUNTER — Telehealth: Payer: Self-pay | Admitting: Family Medicine

## 2024-05-31 NOTE — Telephone Encounter (Signed)
 The best way to tell would be an office visit so we can evaluate her ears

## 2024-05-31 NOTE — Telephone Encounter (Signed)
 Please see patient message and advise.    Copied from CRM [id number]. Topic: Clinical - Medical Advice >> May 31, 2024 10:44 AM Shelly Johnson wrote: Reason for CRM: Patient wants to know how would she know if she has a ear infection. Patient recently had some dental work, and recently went back to the dentist which said she may have one. Patient is requesting a call back and can be reached at [phone].

## 2024-06-03 ENCOUNTER — Encounter: Payer: Self-pay | Admitting: Family Medicine

## 2024-06-03 ENCOUNTER — Ambulatory Visit: Admitting: Family Medicine

## 2024-06-03 VITALS — BP 112/78 | HR 88 | Temp 97.6°F | Ht 62.0 in | Wt 221.8 lb

## 2024-06-03 DIAGNOSIS — G4733 Obstructive sleep apnea (adult) (pediatric): Secondary | ICD-10-CM | POA: Diagnosis not present

## 2024-06-03 DIAGNOSIS — J320 Chronic maxillary sinusitis: Secondary | ICD-10-CM | POA: Diagnosis not present

## 2024-06-03 DIAGNOSIS — I10 Essential (primary) hypertension: Secondary | ICD-10-CM | POA: Diagnosis not present

## 2024-06-03 MED ORDER — AMOXICILLIN-POT CLAVULANATE 875-125 MG PO TABS: 1.0000 | ORAL_TABLET | Freq: Two times a day (BID) | ORAL | 0 refills | Status: AC

## 2024-06-03 NOTE — Progress Notes (Signed)
 Phone [phone] In person visit   Subjective:   Shelly Johnson is a 78 y.o. year old very pleasant female patient who presents for/with See problem oriented charting Chief Complaint  Patient presents with   Sinus Problem    Pt had a root canal 02/02/2024 and ever since she has had head, sinus, jar, and ear pressure; dentist states she may have a sinus infection; no fevers, no coughing, no mucus production; pt declined another round of abx from dentist;     Past Medical History-  Patient Active Problem List   Diagnosis Date Noted   Fatty liver 04/27/2023    Priority: Medium    Severe sleep apnea 04/15/2023    Priority: Medium    Vitamin D  deficiency 09/16/2022    Priority: Medium    Hyperglycemia 01/27/2020    Priority: Medium    Hyperlipidemia, unspecified 09/14/2019    Priority: Medium    Numbness and tingling of both feet 01/21/2018    Priority: Medium    History of depression 06/20/2017    Priority: Medium    Essential hypertension 08/23/2016    Priority: Medium    Dyspnea on exertion 08/23/2016    Priority: Medium    ANEMIA-NOS 03/16/2008    Priority: Medium    DDD (degenerative disc disease), lumbar 11/16/2018    Priority: Low   Morbid obesity (HCC) 04/28/2018    Priority: Low   Hiatal hernia with gastroesophageal reflux 03/08/2008    Priority: Low   Left thyroid  nodule 05/08/2024   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 20 tablet 0   aspirin EC 81 MG tablet Take 81 mg by mouth 2 (two) times a week. Swallow whole.     carboxymethylcellulose (REFRESH PLUS) 0.5 % SOLN Place 1 drop into both eyes 2 (two) times daily as needed (eye irritation).     chlorthalidone  (HYGROTON ) 25 MG  tablet TAKE 1 TABLET BY MOUTH DAILY 90 tablet 2   diclofenac Sodium (VOLTAREN ARTHRITIS PAIN) 1 % GEL Apply 2 g topically daily as needed (knee pain).     ferrous sulfate  325 (65 FE) MG tablet Take 1 tablet (325 mg total) by mouth 2 (two) times daily. (Patient taking differently: Take 325 mg by mouth daily.) 60 tablet 3   naproxen sodium (ALEVE) 220 MG tablet Take 220 mg by mouth 2 (two) times daily as needed (pain).     NON FORMULARY Pt uses a cpap nightly     omeprazole  (PRILOSEC) 40 MG capsule TAKE 1 CAPSULE BY MOUTH DAILY 90 capsule 2   vitamin C (ASCORBIC ACID) 500 MG tablet Take 500 mg by mouth daily.       VITAMIN D  PO Take 1 tablet by mouth daily.     albuterol  (VENTOLIN  HFA) 108 (90 Base) MCG/ACT inhaler Inhale 2 puffs into the lungs every 6 (six) hours as needed for wheezing or shortness of breath. Use 30 minutes prior to activity. (Patient not taking: Reported on 06/03/2024) 8 g 2   No current facility-administered medications for this visit.     Objective:  BP 112/78 (BP Location: Left Arm, Patient Position: Sitting, Cuff Size: Normal)   Pulse 88   Temp 97.6 F (36.4 C) (Temporal)   Ht 5' 2 (1.575  m)   Wt 221 lb 12.8 oz (100.6 kg)   SpO2 100%   BMI 40.57 kg/m  Gen: NAD, resting comfortably Patient reports dental sensitivity along upper left throat.  Has bilateral maxillary sinus tenderness interestingly enough worse on the right.  Tympanogram is normal bilaterally, oropharynx largely normal.  Nasal turbinates largely normal CV: RRR no murmurs rubs or gallops Lungs: CTAB no crackles, wheeze, rhonchi Ext: no edema Skin: warm, dry, scar healing well after partial thyroidectomy    Assessment and Plan   # Chronic sinus pressure S:root canal 02/02/2024 and had ongoing head, sinus, ear, jaw pressure. Dentist concerned for sinus infection. Does have ongoing dental issue with Community Acquired Pneumonia (CAP) on crown- tooth is sensitive . Adjustment last week and still  bothersome. Dentist had mentioned another round of antibiotics- thinks plain amoxicillin   No fevers, coughing. Mild sinus drainage. Gets maxillary and frontal sinus pressure  She has also had surgery of thyroid  nodule- felt run down after the surgery- didn't feel like recovered well from anesthesia. Balance more off since surgery. Got her voice back yesterday A/P: Patient with chronic sinus pressure for at least a month but has at least had intermittent issues since June.  Some nasal discharge.  Shelly Johnson is also wanting her to be on antibiotic amoxicillin -I think the best choice here is to use Augmentin  to cover sinusitis as well as for dental coverage-encourage close follow-up with dentist if fails to improve or with me and we could reconsider sinus CT you enjoy good  We were also thrilled that she did well with her surgery with left thyroid  lobectomy and is recovering her voice.  Also thankful for no cancer.  Offered blood work today but she is planning to do this with Dr. Eletha soon and wants to do this at her 15-month follow-up  #hypertension S: medication: Chlorthalidone  25 mg A/P: Blood pressure well-controlled continue current medication   # Severe sleep apnea--patient reports compliance-continue current treatment.  She has had some difficulty recovering from surgery fatigue and wanted to make sure she remained compliant  Recommended follow up: Return in about 6 months (around 12/02/2024) for followup or sooner if needed.Schedule b4 you leave. Future Appointments  Date Time Provider Department Center  06/09/2024 11:20 AM [facility] MM 2 [facility]  06/30/2024 10:00 AM [facility] ANNUAL WELLNESS VISIT 1 [facility] Shelly Johnson  06/30/2024 11:00 AM Shelly Garnette KIDD, MD [facility] Shelly Johnson  12/08/2024 11:00 AM Shelly Garnette KIDD, MD [facility] Shelly Johnson    Lab/Order associations:   ICD-10-CM   1. Chronic maxillary sinusitis  J32.0     2. Essential hypertension  I10     3. OSA on  CPAP  G47.33       Meds ordered this encounter  Medications   amoxicillin -clavulanate (AUGMENTIN ) 875-125 MG tablet    Sig: Take 1 tablet by mouth 2 (two) times daily for 10 days.    Dispense:  20 tablet    Refill:  0    Return precautions advised.  Garnette Katrinka, MD

## 2024-06-03 NOTE — Patient Instructions (Addendum)
 I am concerned about your tooth but also with over a month of sinus pressure reasonable to treat with antibiotic for that purpose as well. Follow up with dentist after course to reevaluate the tooth or with me if sinuses are continuing to hurt and we could scan then  Recommended follow up: Return in about 6 months (around 12/02/2024) for followup or sooner if needed.Schedule b4 you leave. -cancel November visit if feeling better

## 2024-06-07 DIAGNOSIS — I872 Venous insufficiency (chronic) (peripheral): Secondary | ICD-10-CM | POA: Diagnosis not present

## 2024-06-07 DIAGNOSIS — L814 Other melanin hyperpigmentation: Secondary | ICD-10-CM | POA: Diagnosis not present

## 2024-06-07 DIAGNOSIS — D485 Neoplasm of uncertain behavior of skin: Secondary | ICD-10-CM | POA: Diagnosis not present

## 2024-06-09 ENCOUNTER — Ambulatory Visit
Admission: RE | Admit: 2024-06-09 | Discharge: 2024-06-09 | Disposition: A | Source: Ambulatory Visit | Attending: Family Medicine | Admitting: Family Medicine

## 2024-06-09 DIAGNOSIS — Z1231 Encounter for screening mammogram for malignant neoplasm of breast: Secondary | ICD-10-CM

## 2024-06-17 DIAGNOSIS — H18452 Nodular corneal degeneration, left eye: Secondary | ICD-10-CM | POA: Diagnosis not present

## 2024-06-30 ENCOUNTER — Ambulatory Visit

## 2024-06-30 ENCOUNTER — Encounter: Payer: Self-pay | Admitting: Family Medicine

## 2024-06-30 ENCOUNTER — Ambulatory Visit: Payer: Self-pay | Admitting: Family Medicine

## 2024-06-30 ENCOUNTER — Ambulatory Visit: Admitting: Family Medicine

## 2024-06-30 VITALS — BP 128/72 | HR 78 | Temp 97.4°F | Ht 62.0 in | Wt 220.8 lb

## 2024-06-30 DIAGNOSIS — E890 Postprocedural hypothyroidism: Secondary | ICD-10-CM | POA: Diagnosis not present

## 2024-06-30 DIAGNOSIS — R739 Hyperglycemia, unspecified: Secondary | ICD-10-CM | POA: Diagnosis not present

## 2024-06-30 DIAGNOSIS — E559 Vitamin D deficiency, unspecified: Secondary | ICD-10-CM

## 2024-06-30 DIAGNOSIS — E785 Hyperlipidemia, unspecified: Secondary | ICD-10-CM

## 2024-06-30 DIAGNOSIS — I10 Essential (primary) hypertension: Secondary | ICD-10-CM | POA: Diagnosis not present

## 2024-06-30 DIAGNOSIS — Z131 Encounter for screening for diabetes mellitus: Secondary | ICD-10-CM

## 2024-06-30 LAB — URINALYSIS, ROUTINE W REFLEX MICROSCOPIC
Bilirubin Urine: NEGATIVE
Hgb urine dipstick: NEGATIVE
Ketones, ur: NEGATIVE
Leukocytes,Ua: NEGATIVE
Nitrite: NEGATIVE
RBC / HPF: NONE SEEN (ref 0–?)
Specific Gravity, Urine: <=1.005 — AB (ref 1.000–1.030)
Total Protein, Urine: NEGATIVE
Urine Glucose: NEGATIVE
Urobilinogen, UA: 0.2 (ref 0.0–1.0)
WBC, UA: NONE SEEN (ref 0–?)
pH: 7 (ref 5.0–8.0)

## 2024-06-30 LAB — CBC WITH DIFFERENTIAL/PLATELET
Basophils Absolute: 0 K/uL (ref 0.0–0.1)
Basophils Relative: 0.8 % (ref 0.0–3.0)
Eosinophils Absolute: 0.1 K/uL (ref 0.0–0.7)
Eosinophils Relative: 1.8 % (ref 0.0–5.0)
HCT: 44.4 % (ref 36.0–46.0)
Hemoglobin: 15.1 g/dL — ABNORMAL HIGH (ref 12.0–15.0)
Lymphocytes Relative: 38.9 % (ref 12.0–46.0)
Lymphs Abs: 2.2 K/uL (ref 0.7–4.0)
MCHC: 33.9 g/dL (ref 30.0–36.0)
MCV: 91.3 fl (ref 78.0–100.0)
Monocytes Absolute: 0.4 K/uL (ref 0.1–1.0)
Monocytes Relative: 6.4 % (ref 3.0–12.0)
Neutro Abs: 2.9 K/uL (ref 1.4–7.7)
Neutrophils Relative %: 52.1 % (ref 43.0–77.0)
Platelets: 184 K/uL (ref 150.0–400.0)
RBC: 4.86 Mil/uL (ref 3.87–5.11)
RDW: 13.8 % (ref 11.5–15.5)
WBC: 5.7 K/uL (ref 4.0–10.5)

## 2024-06-30 LAB — VITAMIN D 25 HYDROXY (VIT D DEFICIENCY, FRACTURES): VITD: 32.59 ng/mL (ref 30.00–100.00)

## 2024-06-30 LAB — COMPREHENSIVE METABOLIC PANEL WITH GFR
ALT: 32 U/L (ref 0–35)
AST: 23 U/L (ref 0–37)
Albumin: 4.1 g/dL (ref 3.5–5.2)
Alkaline Phosphatase: 54 U/L (ref 39–117)
BUN: 14 mg/dL (ref 6–23)
CO2: 28 meq/L (ref 19–32)
Calcium: 9.3 mg/dL (ref 8.4–10.5)
Chloride: 101 meq/L (ref 96–112)
Creatinine, Ser: 0.82 mg/dL (ref 0.40–1.20)
GFR: 69.46 mL/min (ref >60.00)
Glucose, Bld: 95 mg/dL (ref 70–99)
Potassium: 3.5 meq/L (ref 3.5–5.1)
Sodium: 141 meq/L (ref 135–145)
Total Bilirubin: 0.5 mg/dL (ref 0.2–1.2)
Total Protein: 6.8 g/dL (ref 6.0–8.3)

## 2024-06-30 LAB — LIPID PANEL
Cholesterol: 182 mg/dL (ref 0–200)
HDL: 54.8 mg/dL (ref >39.00)
LDL Cholesterol: 93 mg/dL (ref 0–99)
NonHDL: 126.85
Total CHOL/HDL Ratio: 3
Triglycerides: 167 mg/dL — ABNORMAL HIGH (ref 0.0–149.0)
VLDL: 33.4 mg/dL (ref 0.0–40.0)

## 2024-06-30 LAB — HEMOGLOBIN A1C: Hgb A1c MFr Bld: 5.9 % (ref 4.6–6.5)

## 2024-06-30 LAB — T4, FREE: Free T4: 0.85 ng/dL (ref 0.60–1.60)

## 2024-06-30 LAB — TSH: TSH: 5.1 u[IU]/mL (ref 0.35–5.50)

## 2024-06-30 LAB — T3, FREE: T3, Free: 3.6 pg/mL (ref 2.3–4.2)

## 2024-06-30 NOTE — Patient Instructions (Addendum)
Please stop by lab before you go If you have mychart- we will send your results within 3 business days of [place] receiving them.  If you do not have mychart- we will call you about results within 5 business days of [place] receiving them.  *please also note that you will see labs on mychart as soon as they post. I will later go in and write notes on them- will say "notes from Dr. Yong Channel"   No changes today unless labs lead [place] to make changes  Recommended follow up: Return for next already scheduled visit or sooner if needed.

## 2024-06-30 NOTE — Addendum Note (Signed)
 Addended by: KATRINKA GARNETTE KIDD on: 06/30/2024 12:34 PM   Modules accepted: Orders

## 2024-06-30 NOTE — Progress Notes (Signed)
 Phone [phone] In person visit   Subjective:   Shelly Johnson is a 78 y.o. year old very pleasant female patient who presents for/with See problem oriented charting Chief Complaint  Patient presents with   Medical Management of Chronic Issues    6 month follow up; thyroid  follow up and labs for thyroid  medication;     Past Medical History-  Patient Active Problem List   Diagnosis Date Noted   Fatty liver 04/27/2023    Priority: Medium    Severe sleep apnea 04/15/2023    Priority: Medium    Vitamin D  deficiency 09/16/2022    Priority: Medium    Hyperglycemia 01/27/2020    Priority: Medium    Hyperlipidemia, unspecified 09/14/2019    Priority: Medium    Numbness and tingling of both feet 01/21/2018    Priority: Medium    History of depression 06/20/2017    Priority: Medium    Essential hypertension 08/23/2016    Priority: Medium    Dyspnea on exertion 08/23/2016    Priority: Medium    ANEMIA-NOS 03/16/2008    Priority: Medium    DDD (degenerative disc disease), lumbar 11/16/2018    Priority: Low   Morbid obesity (HCC) 04/28/2018    Priority: Low   Hiatal hernia with gastroesophageal reflux 03/08/2008    Priority: Low   Left thyroid  nodule 05/08/2024   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 Swallow whole.     carboxymethylcellulose (REFRESH PLUS) 0.5 % SOLN Place 1 drop into both eyes 2 (two) times daily as needed (eye irritation).     chlorthalidone  (HYGROTON ) 25 MG tablet TAKE 1 TABLET BY MOUTH DAILY 90 tablet 2   diclofenac Sodium (VOLTAREN ARTHRITIS PAIN) 1 % GEL Apply 2 g topically daily as needed (knee pain).     ferrous sulfate  325 (65 FE) MG tablet Take 1 tablet (325 mg total) by mouth 2 (two) times daily. (Patient taking  differently: Take 325 mg by mouth daily.) 60 tablet 3   naproxen sodium (ALEVE) 220 MG tablet Take 220 mg by mouth 2 (two) times daily as needed (pain).     NON FORMULARY Pt uses a cpap nightly     omeprazole  (PRILOSEC) 40 MG capsule TAKE 1 CAPSULE BY MOUTH DAILY 90 capsule 2   vitamin C (ASCORBIC ACID) 500 MG tablet Take 500 mg by mouth daily.       VITAMIN D  PO Take 1 tablet by mouth daily.     albuterol  (VENTOLIN  HFA) 108 (90 Base) MCG/ACT inhaler Inhale 2 puffs into the lungs every 6 (six) hours as needed for wheezing or shortness of breath. Use 30 minutes prior to activity. (Patient not taking: Reported on 06/30/2024) 8 g 2   No current facility-administered medications for this visit.     Objective:  BP 128/72 (BP Location: Left Arm, Patient Position: Sitting, Cuff Size: Normal)   Pulse 78   Temp (!) 97.4 F (36.3 C) (Temporal)   Ht 5' 2 (1.575 m)   Wt 220 lb 12.8 oz (100.2 kg)   SpO2 97%   BMI 40.38 kg/m  Gen: NAD, resting comfortably CV: RRR no murmurs rubs or gallops Lungs: CTAB no crackles, wheeze, rhonchi Ext: trace edema Skin: warm, dry    Assessment  and Plan   # Sinusitis-seen about a month ago and treated with course of Augmentin  after having sinus pressure for nearly a month with some nasal discharge.  We also did this to allow for some dental coverage that dentist had requested - apparently didn't help dental issue- has appointment this afternoon  #corneal issue- working with optho on this- and apparently needs this before cataract surgery  # History of left thyroid  lobectomy earlier this year-50% chance of malignancy but thankfully no cancer was found - She is in need of updated thyroid  labs-check TSH, T3, T4   #hematoma at blood draw site on left hand- gradually improving  #hypertension S: medication: Chlorthalidone  25 mg A/P: Blood pressure well-controlled today-continue current medication   #hyperlipidemia S: Medication:Has declined medication  Lab  Results  Component Value Date   CHOL 197 06/25/2022   HDL 62.20 06/25/2022   LDLCALC 110 (H) 06/25/2022   LDLDIRECT 113.0 09/16/2019   TRIG 123.0 06/25/2022   CHOLHDL 3 06/25/2022   A/P: Cholesterol has been mild poorly controlled-offered updating lipid panel as well as CT calcium scoring if ASCVD risk elevated- but reassuring other than atherosclerosis noted on heart on scan about a year ago chest CT   # GERD-has seen Dr. Abran who is aware of hiatal hernia S:Medication: Omeprazole  40 mg A/P: Reasonable control-continue current medication  # Severe sleep apnea-compliant with CPAP thankfully- doesn't love it though   #Vitamin D  deficiency S: Medication: d3 1000 units at least  Lab Results  Component Value Date   VD25OH 22.91 (L) 08/28/2022  A/P: update vitamin D  with labs- may need higher dose      # Hyperglycemia/insulin resistance/prediabetes S:  Medication: none Lab Results  Component Value Date   HGBA1C 6.0 06/25/2022   HGBA1C 5.7 (H) 06/04/2020   HGBA1C 6.5 01/31/2020   A/P: hopefully stable- update a1c today. Continue current meds for now    Recommended follow up: No follow-ups on file. Future Appointments  Date Time Provider Department Center  07/05/2024  2:20 PM [facility] VISIT 1 [facility]  12/08/2024 11:00 AM Katrinka Garnette KIDD, MD [facility]    Lab/Order associations:   ICD-10-CM   1. Essential hypertension  I10     2. Hyperlipidemia, unspecified hyperlipidemia type  E78.5     3. Vitamin D  deficiency  E55.9     4. Hyperglycemia  R73.9     5. Screening for diabetes mellitus  Z13.1     6. 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   Return precautions advised.  Garnette Katrinka, MD

## 2024-07-01 NOTE — Progress Notes (Signed)
 Patient aware of results and suggestions from Dr. Katrinka via MyChart- Last read by Jon Nida Kristie Mercy at 7:22PM on 06/30/2024.

## 2024-07-04 ENCOUNTER — Telehealth: Payer: Self-pay | Admitting: Primary Care

## 2024-07-04 DIAGNOSIS — G4733 Obstructive sleep apnea (adult) (pediatric): Secondary | ICD-10-CM

## 2024-07-04 NOTE — Telephone Encounter (Signed)
 Shelly Johnson, the patient is experiencing issues with their current mask and is requesting a mask fitting. Could we have an order for this? Thank you!

## 2024-07-05 ENCOUNTER — Ambulatory Visit (INDEPENDENT_AMBULATORY_CARE_PROVIDER_SITE_OTHER)

## 2024-07-05 VITALS — BP 132/76 | HR 78 | Temp 97.3°F | Ht 62.0 in | Wt 222.8 lb

## 2024-07-05 DIAGNOSIS — Z0000 Encounter for general adult medical examination without abnormal findings: Secondary | ICD-10-CM | POA: Diagnosis not present

## 2024-07-05 NOTE — Progress Notes (Signed)
 Chief Complaint  Patient presents with   Medicare Wellness     Subjective:   Shelly Johnson is a 78 y.o. female who presents for a Medicare Annual Wellness Visit.  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 ? ovaries and cervix.    APPENDECTOMY     HIATAL HERNIA REPAIR     THYROID  LOBECTOMY Left 05/09/2024   Procedure: LOBECTOMY, THYROID ;  Surgeon: Eletha Boas, MD;  Location: [facility]S;  Service: General;  Laterality: Left;  LEFT THYROID  LOBECTOMY   Family History  Problem Relation Age of Onset   Arthritis Mother    Uterine cancer Mother        uterine   Diabetes Brother        unknown cause   Hypertension Brother    Diabetes Brother        complications after amputation   CAD Brother        medical error as reported cause of death   Lung cancer Brother        youngest   Breast cancer Maternal Grandmother    Colon cancer Maternal Aunt 96   Liver disease Neg Hx    Esophageal cancer Neg Hx    Social History   Occupational History   Occupation: Retired  Tobacco Use   Smoking status: Never   Smokeless tobacco: Never  Vaping Use   Vaping status: Never Used  Substance and Sexual Activity   Alcohol use: Yes    Comment: rare   Drug use: No   Sexual activity: Not Currently   Tobacco Counseling Counseling given: Not Answered  SDOH Screenings   Food Insecurity: No Food Insecurity  (07/05/2024)  Housing: Unknown (07/05/2024)  Transportation Needs: No Transportation Needs (07/05/2024)  Utilities: Not At Risk (07/05/2024)  Depression (PHQ2-9): Low Risk  (07/05/2024)  Physical Activity: Insufficiently Active (07/05/2024)  Social Connections: Moderately Integrated (07/05/2024)  Stress: No Stress Concern Present (07/05/2024)  Tobacco Use: Low Risk  (07/05/2024)  Health Literacy: Adequate Health Literacy (07/05/2024)   See flowsheets for full screening details  Depression Screen PHQ 2 & 9 Depression Scale- Over the past 2 weeks, how often have you been bothered by any of the following problems? Little interest or pleasure in doing things: 0 Feeling down, depressed, or hopeless (PHQ Adolescent also includes...irritable): 0 PHQ-2 Total Score: 0 Trouble falling or staying asleep, or sleeping too much: 0 Feeling tired or having little energy: 0 Poor appetite or overeating (PHQ Adolescent also includes...weight loss): 0 Feeling bad about yourself - or that you are a failure or have let yourself or your family down: 0 Trouble concentrating on things, such as reading the newspaper or watching television (PHQ Adolescent also includes...like school work):  0 Moving or speaking so slowly that other people could have noticed. Or the opposite - being so fidgety or restless that you have been moving around a lot more than usual: 0 Thoughts that you would be better off dead, or of hurting yourself in some way: 0 PHQ-9 Total Score: 0 If you checked off any problems, how difficult have these problems made it for you to do your work, take care of things at home, or get along with other people?: Not difficult at all     Goals Addressed               This Visit's Progress     maintain health and activity (pt-stated)        Maintain health and activity        Visit info / Clinical Intake: Medicare Wellness Visit Type:: Subsequent Annual Wellness Visit Persons participating in  visit:: patient Medicare Wellness Visit Mode:: In-person (required for WTM) Information given by:: patient Interpreter Needed?: No Pre-visit prep was completed: yes AWV questionnaire completed by patient prior to visit?: no Living arrangements:: (!) lives alone Patient's Overall Health Status Rating: good Typical amount of pain: some Does pain affect daily life?: (!) yes Are you currently prescribed opioids?: no  Dietary Habits and Nutritional Risks How many meals a day?: 3 Eats fruit and vegetables daily?: (!) no (not so much veg) Most meals are obtained by: eating out (frozen meals  not much cook) In the last 2 weeks, have you had any of the following?: none Diabetic:: no  Functional Status Activities of Daily Living (to include ambulation/medication): Independent Ambulation: Independent with device- listed below Home Assistive Devices/Equipment: CPAP; Eyeglasses Medication Administration: Independent Home Management: Independent Manage your own finances?: yes Primary transportation is: driving Concerns about vision?: no *vision screening is required for WTM* Concerns about hearing?: no  Fall Screening Falls in the past year?: 0 Number of falls in past year: 0 Was there an injury with Fall?: 0 Fall Risk Category Calculator: 0 Patient Fall Risk Level: Low Fall Risk  Fall Risk Patient at Risk for Falls Due to: Impaired balance/gait Fall risk Follow up: Falls prevention discussed  Home and Transportation Safety: All rugs have non-skid backing?: yes All stairs or steps have railings?: yes Grab bars in the bathtub or shower?: (!) no Have non-skid surface in bathtub or shower?: yes Good home lighting?: yes Regular seat belt use?: yes Hospital stays in the last year:: no  Cognitive Assessment Difficulty concentrating, remembering, or making decisions? : no Will 6CIT or Mini Cog be Completed: no 6CIT or Mini Cog Declined: patient alert, oriented, able to answer  questions appropriately and recall recent events  Advance Directives (For Healthcare) Does Patient Have a Medical Advance Directive?: Yes Does patient want to make changes to medical advance directive?: No - Patient declined Type of Advance Directive: Healthcare Power of Attorney Copy of Healthcare Power of Attorney in Chart?: No - copy requested  Reviewed/Updated  Reviewed/Updated: Reviewed All (Medical, Surgical, Family, Medications, Allergies, Care Teams, Patient Goals)        Objective:    Today's Vitals   07/05/24 1427  BP: 132/76  Pulse: 78  Temp: (!) 97.3 F (36.3 C)  SpO2: 94%  Weight: 222 lb 12.8 oz (101.1 kg)  Height: 5' 2 (1.575 m)   Body mass index is 40.75 kg/m.  Current Medications (verified) Outpatient Encounter Medications as of 07/05/2024  Medication Sig   aspirin EC 81 MG tablet Take 81 mg by  mouth 2 (two) times a week. Swallow whole.   carboxymethylcellulose (REFRESH PLUS) 0.5 % SOLN Place 1 drop into both eyes 2 (two) times daily as needed (eye irritation).   chlorthalidone  (HYGROTON ) 25 MG tablet TAKE 1 TABLET BY MOUTH DAILY   diclofenac Sodium (VOLTAREN ARTHRITIS PAIN) 1 % GEL Apply 2 g topically daily as needed (knee pain).   ferrous sulfate  325 (65 FE) MG tablet Take 1 tablet (325 mg total) by mouth 2 (two) times daily.   naproxen sodium (ALEVE) 220 MG tablet Take 220 mg by mouth 2 (two) times daily as needed (pain).   NON FORMULARY Pt uses a cpap nightly   omeprazole  (PRILOSEC) 40 MG capsule TAKE 1 CAPSULE BY MOUTH DAILY   vitamin C (ASCORBIC ACID) 500 MG tablet Take 500 mg by mouth daily.     VITAMIN D  PO Take 1 tablet by mouth daily.   albuterol  (VENTOLIN  HFA) 108 (90 Base) MCG/ACT inhaler Inhale 2 puffs into the lungs every 6 (six) hours as needed for wheezing or shortness of breath. Use 30 minutes prior to activity. (Patient not taking: Reported on 06/30/2024)   No facility-administered encounter medications on file as of 07/05/2024.    Hearing/Vision screen Hearing Screening - Comments:: Pt denies any hearing issues  Vision Screening - Comments:: Wears rx glasses - up to date with routine eye exams with Dr Leslee  Immunizations and Health Maintenance Health Maintenance  Topic Date Due   Zoster Vaccines- Shingrix (1 of 2) 09/30/2024 (Originally 01/15/1965)   Influenza Vaccine  11/15/2024 (Originally 03/18/2024)   DTaP/Tdap/Td (2 - Td or Tdap) 03/22/2025 (Originally 07/22/2023)   Medicare Annual Wellness (AWV)  07/05/2025   Pneumococcal Vaccine: 50+ Years  Completed   DEXA SCAN  Completed   Hepatitis C Screening  Completed   Meningococcal B Vaccine  Aged Out   Mammogram  Discontinued   Colonoscopy  Discontinued   COVID-19 Vaccine  Discontinued   Fecal DNA (Cologuard)  Discontinued        Assessment/Plan:  This is a routine wellness examination for Shelly Johnson.  Patient Care Team: Katrinka Garnette KIDD, MD as PCP - General (Family Medicine) Wonda Sharper, MD as Consulting Physician (Cardiology) Ernie Cough, MD as Consulting Physician (Orthopedic Surgery) Janit Thresa HERO, DPM as Consulting Physician (Podiatry) Pandora Cadet, [facility] as Pharmacist (Pharmacist)  I have personally reviewed and noted the following in the patient's chart:   Medical and social history Use of alcohol, tobacco or illicit drugs  Current medications and supplements including opioid prescriptions. Functional ability and status Nutritional status Physical activity Advanced directives List of other physicians Hospitalizations, surgeries, and ER visits in previous 12 months Vitals Screenings to include cognitive, depression, and falls Referrals and appointments  No orders of the defined types were placed in this encounter.  In addition, I have reviewed and discussed with patient certain preventive protocols, quality metrics, and best practice recommendations. A written personalized care plan for preventive services as well as general preventive  health recommendations were provided to patient.   Shelly VEAR Haws, LPN   [id number]/81/[id number]   Return in 1 year (on 07/17/2025).  After Visit Summary: (In Person-Printed) AVS printed and given to the patient  Nurse Notes: none

## 2024-07-05 NOTE — Patient Instructions (Signed)
 Shelly Johnson,  Thank you for taking the time for your Medicare Wellness Visit. I appreciate your continued commitment to your health goals. Please review the care plan we discussed, and feel free to reach out if I can assist you further.  Please note that Annual Wellness Visits do not include a physical exam. Some assessments may be limited, especially if the visit was conducted virtually. If needed, we may recommend an in-person follow-up with your provider.  Ongoing Care Seeing your primary care provider every 3 to 6 months helps us  monitor your health and provide consistent, personalized care.   Referrals If a referral was made during today's visit and you haven't received any updates within two weeks, please contact the referred provider directly to check on the status.  Recommended Screenings:  Health Maintenance  Topic Date Due   Medicare Annual Wellness Visit  01/26/2021   Zoster (Shingles) Vaccine (1 of 2) 09/30/2024*   Flu Shot  11/15/2024*   DTaP/Tdap/Td vaccine (2 - Td or Tdap) 03/22/2025*   Pneumococcal Vaccine for age over 24  Completed   DEXA scan (bone density measurement)  Completed   Hepatitis C Screening  Completed   Meningitis B Vaccine  Aged Out   Breast Cancer Screening  Discontinued   Colon Cancer Screening  Discontinued   COVID-19 Vaccine  Discontinued   Cologuard (Stool DNA test)  Discontinued  *Topic was postponed. The date shown is not the original due date.       05/09/2024   10:40 AM  Advanced Directives  Does Patient Have a Medical Advance Directive? Yes  Type of Advance Directive Healthcare Power of Attorney  Does patient want to make changes to medical advance directive? No - Patient declined  Copy of Healthcare Power of Attorney in Chart? No - copy requested    Vision: Annual vision screenings are recommended for early detection of glaucoma, cataracts, and diabetic retinopathy. These exams can also reveal signs of chronic conditions such as diabetes  and high blood pressure.  Dental: Annual dental screenings help detect early signs of oral cancer, gum disease, and other conditions linked to overall health, including heart disease and diabetes.  Please see the attached documents for additional preventive care recommendations.

## 2024-07-06 NOTE — Telephone Encounter (Signed)
 Ordered for mask fitting in-lab  Thanks

## 2024-07-07 NOTE — Telephone Encounter (Signed)
 I called and spoke to pt. Pt informed of Beth';s note and verbalized understanding. Pt states her appt is already set up. NFN

## 2024-07-12 ENCOUNTER — Telehealth: Payer: Self-pay | Admitting: Family Medicine

## 2024-07-12 NOTE — Telephone Encounter (Signed)
 She might want to consider a probiotic like align or Florastor.  Getting a good nights rest, avoiding stress, exercising will help the immune system.  It sounds like the prolonged antibiotics are necessary unfortunately

## 2024-07-12 NOTE — Telephone Encounter (Signed)
 Please see patient message and advise.   Copied from CRM #[id number]. Topic: Clinical - Medical Advice >> Jul 12, 2024  2:27 PM Mia F wrote: Reason for CRM: Pt says she had a problem with her root canal and she was on anibiotics by one dentist and was just given another. She is worried about being on multiple anitbiotics. She is concerned that this could be lowering her immune system so she would like to know if this is something she should or should not continue doing. She said she just finished one antibiotic and now she is taking Doxycycline  hyclate for 30 days. She said she was told it could take up to 90 days. She says this is due to her root canal that is not healing properly   Please call pt back [phone]

## 2024-07-19 ENCOUNTER — Ambulatory Visit (HOSPITAL_BASED_OUTPATIENT_CLINIC_OR_DEPARTMENT_OTHER): Attending: Primary Care

## 2024-07-19 DIAGNOSIS — G4733 Obstructive sleep apnea (adult) (pediatric): Secondary | ICD-10-CM

## 2024-07-22 DIAGNOSIS — H18452 Nodular corneal degeneration, left eye: Secondary | ICD-10-CM | POA: Diagnosis not present

## 2024-12-08 ENCOUNTER — Ambulatory Visit: Admitting: Family Medicine

## 2025-07-17 ENCOUNTER — Ambulatory Visit
